# Patient Record
Sex: Male | Born: 1955
Health system: Southern US, Community
[De-identification: ages and names within clinical notes are randomized; demographics above are authoritative.]

## PROBLEM LIST (undated history)

## (undated) DIAGNOSIS — E785 Hyperlipidemia, unspecified: Secondary | ICD-10-CM

## (undated) DIAGNOSIS — H269 Unspecified cataract: Secondary | ICD-10-CM

## (undated) DIAGNOSIS — R42 Dizziness and giddiness: Secondary | ICD-10-CM

## (undated) DIAGNOSIS — K219 Gastro-esophageal reflux disease without esophagitis: Secondary | ICD-10-CM

## (undated) DIAGNOSIS — I10 Essential (primary) hypertension: Secondary | ICD-10-CM

## (undated) DIAGNOSIS — I219 Acute myocardial infarction, unspecified: Secondary | ICD-10-CM

## (undated) DIAGNOSIS — D110 Benign neoplasm of parotid gland: Secondary | ICD-10-CM

## (undated) DIAGNOSIS — I2510 Atherosclerotic heart disease of native coronary artery without angina pectoris: Secondary | ICD-10-CM

## (undated) DIAGNOSIS — T7840XA Allergy, unspecified, initial encounter: Secondary | ICD-10-CM

## (undated) DIAGNOSIS — K118 Other diseases of salivary glands: Secondary | ICD-10-CM

## (undated) HISTORY — DX: Allergy, unspecified, initial encounter: T78.40XA

## (undated) HISTORY — DX: Atherosclerotic heart disease of native coronary artery without angina pectoris: I25.10

## (undated) HISTORY — DX: Unspecified cataract: H26.9

## (undated) HISTORY — DX: Hyperlipidemia, unspecified: E78.5

## (undated) HISTORY — DX: Essential (primary) hypertension: I10

## (undated) HISTORY — DX: Gastro-esophageal reflux disease without esophagitis: K21.9

## (undated) HISTORY — DX: Acute myocardial infarction, unspecified: I21.9

---

## 2008-05-29 ENCOUNTER — Ambulatory Visit: Payer: Self-pay | Admitting: Internal Medicine

## 2008-05-29 DIAGNOSIS — I10 Essential (primary) hypertension: Secondary | ICD-10-CM

## 2008-05-29 DIAGNOSIS — K219 Gastro-esophageal reflux disease without esophagitis: Secondary | ICD-10-CM | POA: Insufficient documentation

## 2008-05-29 DIAGNOSIS — J301 Allergic rhinitis due to pollen: Secondary | ICD-10-CM | POA: Insufficient documentation

## 2008-05-29 DIAGNOSIS — R002 Palpitations: Secondary | ICD-10-CM | POA: Insufficient documentation

## 2008-05-29 LAB — CONVERTED CEMR LAB
Ketones, urine, test strip: NEGATIVE
Specific Gravity, Urine: <1.005
Urobilinogen, UA: 0.2
pH: 6.5

## 2008-05-31 LAB — CONVERTED CEMR LAB
AST: 19 units/L (ref 0–37)
Alkaline Phosphatase: 52 units/L (ref 39–117)
Basophils Absolute: 0 10*3/uL (ref 0.0–0.1)
Basophils Relative: 0.5 % (ref 0.0–3.0)
Bilirubin, Direct: 0.1 mg/dL (ref 0.0–0.3)
Eosinophils Absolute: 0.1 10*3/uL (ref 0.0–0.7)
Lymphocytes Relative: 18.7 % (ref 12.0–46.0)
MCHC: 35.1 g/dL (ref 30.0–36.0)
MCV: 86.3 fL (ref 78.0–100.0)
Neutrophils Relative %: 76.5 % (ref 43.0–77.0)
Platelets: 186 10*3/uL (ref 150–400)
RBC: 4.99 M/uL (ref 4.22–5.81)
RDW: 12.2 % (ref 11.5–14.6)
Total Bilirubin: 0.6 mg/dL (ref 0.3–1.2)

## 2008-06-30 ENCOUNTER — Ambulatory Visit: Payer: Self-pay | Admitting: Internal Medicine

## 2008-06-30 DIAGNOSIS — R079 Chest pain, unspecified: Secondary | ICD-10-CM

## 2008-07-03 LAB — CONVERTED CEMR LAB
Albumin: 4 g/dL (ref 3.5–5.2)
CO2: 31 meq/L (ref 19–32)
Calcium: 9.4 mg/dL (ref 8.4–10.5)
Creatinine, Ser: 0.9 mg/dL (ref 0.4–1.5)
GFR calc Af Amer: 114 mL/min
GFR calc non Af Amer: 95 mL/min
HDL: 29.8 mg/dL — ABNORMAL LOW (ref >39.0)
LDL Cholesterol: 153 mg/dL — ABNORMAL HIGH (ref 0–99)
Sodium: 139 meq/L (ref 135–145)
Total CHOL/HDL Ratio: 6.6
Triglycerides: 77 mg/dL (ref 0–149)
VLDL: 15 mg/dL (ref 0–40)

## 2009-01-01 ENCOUNTER — Ambulatory Visit: Payer: Self-pay | Admitting: Internal Medicine

## 2009-01-01 DIAGNOSIS — E785 Hyperlipidemia, unspecified: Secondary | ICD-10-CM | POA: Insufficient documentation

## 2009-01-01 DIAGNOSIS — L570 Actinic keratosis: Secondary | ICD-10-CM | POA: Insufficient documentation

## 2009-01-02 LAB — CONVERTED CEMR LAB
AST: 19 units/L (ref 0–37)
Alkaline Phosphatase: 43 units/L (ref 39–117)
BUN: 10 mg/dL (ref 6–23)
Basophils Absolute: 0.1 10*3/uL (ref 0.0–0.1)
Basophils Relative: 1 % (ref 0–1)
Eosinophils Relative: 2 % (ref 0–5)
Glucose, Bld: 96 mg/dL (ref 70–99)
Hemoglobin: 15 g/dL (ref 13.0–17.0)
Lymphocytes Relative: 21 % (ref 12–46)
MCHC: 34.6 g/dL (ref 30.0–36.0)
Monocytes Absolute: 0.8 10*3/uL (ref 0.1–1.0)
Neutro Abs: 4.9 10*3/uL (ref 1.7–7.7)
PSA: 0.86 ng/mL (ref 0.10–4.00)
Platelets: 225 10*3/uL (ref 150–400)
RDW: 13.2 % (ref 11.5–15.5)
Total Bilirubin: 0.4 mg/dL (ref 0.3–1.2)

## 2009-01-15 ENCOUNTER — Ambulatory Visit: Payer: Self-pay | Admitting: Internal Medicine

## 2009-01-16 ENCOUNTER — Encounter: Payer: Self-pay | Admitting: Internal Medicine

## 2009-01-16 LAB — CONVERTED CEMR LAB: Fecal Occult Bld: NEGATIVE

## 2009-07-17 ENCOUNTER — Ambulatory Visit: Payer: Self-pay | Admitting: Internal Medicine

## 2009-07-17 DIAGNOSIS — L301 Dyshidrosis [pompholyx]: Secondary | ICD-10-CM | POA: Insufficient documentation

## 2010-01-10 ENCOUNTER — Ambulatory Visit: Payer: Self-pay | Admitting: Internal Medicine

## 2010-01-11 LAB — CONVERTED CEMR LAB
Albumin: 4.5 g/dL (ref 3.5–5.2)
Alkaline Phosphatase: 46 units/L (ref 39–117)
Basophils Absolute: 0.1 10*3/uL (ref 0.0–0.1)
CO2: 27 meq/L (ref 19–32)
Calcium: 9.7 mg/dL (ref 8.4–10.5)
Chloride: 100 meq/L (ref 96–112)
Glucose, Bld: 76 mg/dL (ref 70–99)
HCT: 41.3 % (ref 39.0–52.0)
Hemoglobin: 14.5 g/dL (ref 13.0–17.0)
Lymphocytes Relative: 24 % (ref 12–46)
Lymphs Abs: 1.6 10*3/uL (ref 0.7–4.0)
Monocytes Absolute: 0.6 10*3/uL (ref 0.1–1.0)
Monocytes Relative: 8 % (ref 3–12)
Neutro Abs: 4.4 10*3/uL (ref 1.7–7.7)
PSA: 0.9 ng/mL (ref 0.10–4.00)
Potassium: 3.7 meq/L (ref 3.5–5.3)
RBC: 4.98 M/uL (ref 4.22–5.81)
Sodium: 138 meq/L (ref 135–145)
Total Protein: 7.1 g/dL (ref 6.0–8.3)
WBC: 6.7 10*3/uL (ref 4.0–10.5)

## 2010-01-21 ENCOUNTER — Ambulatory Visit: Payer: Self-pay | Admitting: Internal Medicine

## 2010-01-21 LAB — CONVERTED CEMR LAB: Fecal Occult Bld: NEGATIVE

## 2010-07-15 ENCOUNTER — Ambulatory Visit: Payer: Self-pay | Admitting: Internal Medicine

## 2010-08-27 NOTE — Letter (Signed)
Summary: [facility]: Immunoassay Fecal Occult Blood (iFOB) Order Form  [name] [facility]  [address] [place], [place] [address]   Phone: [phone]  Fax: [phone]       Lab: Immunoassay Fecal Occult Blood (iFOB) Order Form   January 10, 2010 MRN: [id number]   [facility] [employer] May 12, 1956   Physicican Name:_______[place]__________________  Diagnosis Code:________V76.51__________________      Cindee Salt MD

## 2010-08-27 NOTE — Letter (Signed)
Summary: Results Follow up Letter  [place] [facility]  [address] [place], [place] [address]   Phone: [phone]  Fax: [phone]    01/21/2010 MRN: [id number]  Jason Gross [address] [place], [place]  [address]  Dear Mr. HANTZ,  The following are the results of your recent test(s):  Test         Result    Pap Smear:        Normal _____  Not Normal _____ Comments: ______________________________________________________ Cholesterol: LDL(Bad cholesterol):         Your goal is less than:         HDL (Good cholesterol):       Your goal is more than: Comments:  ______________________________________________________ Mammogram:        Normal _____  Not Normal _____ Comments:  ___________________________________________________________________ Hemoccult:        Normal __X___  Not normal _______ Comments:  stool test negative for blood, we will recheck this next year.  _____________________________________________________________________ Other Tests:    We routinely do not discuss normal results over the telephone.  If you desire a copy of the results, or you have any questions about this information we can discuss them at your next office visit.   Sincerely,      Nada Libman

## 2010-08-27 NOTE — Assessment & Plan Note (Signed)
Summary: CPX / LFW   Vital Signs:  Patient profile:   55 year old male Height:      70.75 inches Weight:      207 pounds BMI:     29.18 Temp:     98.9 degrees F oral Pulse rate:   64 / minute Pulse rhythm:   regular BP sitting:   126 / 78  (left arm) Cuff size:   regular  Vitals Entered By: Lewanda Rife LPN (January 10, 2010 2:07 PM) CC: CPX   History of Present Illness: doing okay  still has trouble with hands triamcinolone didn't help back to his lotion  has skin tag under right eye bothers wife but not him     Allergies (verified): No Known Drug Allergies  Review of Systems General:  No sig exercise--does walk occ, now with grandkids and son in house so keeps him busy sleeps okay wears seat belt. Eyes:  Denies double vision and vision loss-1 eye. ENT:  Complains of decreased hearing; denies ringing in ears; wife notices some decreased hearing wears hearing protection at work teeth okay---regular with dentist. CV:  Denies chest pain or discomfort, difficulty breathing at night, difficulty breathing while lying down, fainting, lightheadness, palpitations, and shortness of breath with exertion. Resp:  Denies cough and shortness of breath. GI:  Complains of indigestion; denies abdominal pain, bloody stools, change in bowel habits, dark tarry stools, nausea, and vomiting; occ indigestion--drinks carbonated drink, burps and better. GU:  Denies erectile dysfunction, urinary frequency, and urinary hesitancy. MS:  Complains of cramps; denies joint pain and joint swelling. Derm:  See HPI; Complains of lesion(s); denies rash. Neuro:  Denies headaches, numbness, tingling, and weakness. Psych:  Denies anxiety and depression. Heme:  Denies abnormal bruising and enlarge lymph nodes. Allergy:  Complains of seasonal allergies and sneezing; uses OTC meds as needed .  Physical Exam  General:  alert and normal appearance.   Eyes:  pupils equal, pupils round, pupils reactive to  light, and no optic disk abnormalities.   Ears:  R ear normal and L ear normal.   Mouth:  no erythema, no exudates, and no lesions.   Neck:  supple, no masses, no thyromegaly, no carotid bruits, and no cervical lymphadenopathy.   Lungs:  normal respiratory effort and normal breath sounds.   Heart:  normal rate, regular rhythm, no murmur, and no gallop.   Abdomen:  soft, non-tender, and no masses.   Rectal:  no hemorrhoids and no masses.   Prostate:  no gland enlargement and no nodules.   Msk:  no joint tenderness and no joint swelling.   Pulses:  normal in feet Extremities:  no edema Neurologic:  alert & oriented X3, strength normal in all extremities, and gait normal.   Skin:  no rashes and no suspicious lesions.   Axillary Nodes:  No palpable lymphadenopathy Psych:  normally interactive, good eye contact, not anxious appearing, and not depressed appearing.     Impression & Recommendations:  Problem # 1:  PREVENTIVE HEALTH CARE (ICD-V70.0) Assessment Comment Only healthy counselled on fitness stool immunoassay PSA  Problem # 2:  HYPERTENSION (ICD-401.9) Assessment: Unchanged  good control no changes needed  His updated medication list for this problem includes:    Lisinopril-hydrochlorothiazide 10-12.5 Mg Tabs (Lisinopril-hydrochlorothiazide) .Marland Kitchen... 1 daily  BP today: 126/78 Prior BP: 130/80 (07/17/2009)  Labs Reviewed: K+: 4.1 (01/01/2009) Creat: : 0.83 (01/01/2009)   Chol: 198 (06/30/2008)   HDL: 29.8 (06/30/2008)   LDL: 153 (06/30/2008)  TG: 77 (06/30/2008)  Orders: TLB-Renal Function Panel (80069-RENAL) TLB-CBC Platelet - w/Differential (85025-CBCD) TLB-Hepatic/Liver Function Pnl (80076-HEPATIC) TLB-TSH (Thyroid Stimulating Hormone) (84443-TSH) Venipuncture (40347)  Problem # 3:  HYPERLIPIDEMIA (ICD-272.4) Assessment: Unchanged okay without Rx ---would only be primary prevention  Labs Reviewed: SGOT: 19 (01/01/2009)   SGPT: 29 (01/01/2009)   HDL:29.8  (06/30/2008)  LDL:153 (06/30/2008)  Chol:198 (06/30/2008)  Trig:77 (06/30/2008)  Complete Medication List: 1)  Lisinopril-hydrochlorothiazide 10-12.5 Mg Tabs (Lisinopril-hydrochlorothiazide) .Marland Kitchen.. 1 daily 2)  Cetirizine Hcl 10 Mg Tabs (Cetirizine hcl) .... As needed 3)  Triamcinolone Acetonide 0.1 % Lotn (Triamcinolone acetonide) .... Apply to hands three times a day as needed  Other Orders: TLB-PSA (Prostate Specific Antigen) (84153-PSA)  Patient Instructions: 1)  Please schedule a follow-up appointment in 6 months .  2)  Complete your hemoccult cards and return them soon.   Current Allergies (reviewed today): No known allergies   Prevention & Chronic Care Immunizations   Influenza vaccine: Fluvax 3+  (07/17/2009)    Tetanus booster: 07/28/2005: Historical    Pneumococcal vaccine: Not documented  Colorectal Screening   Hemoccult: Not documented    Colonoscopy: Not documented  Other Screening   PSA: 0.86  (01/01/2009)   PSA ordered.   Smoking status: quit  (05/29/2008)  Lipids   Total Cholesterol: 198  (06/30/2008)   LDL: 153  (06/30/2008)   LDL Direct: Not documented   HDL: 29.8  (06/30/2008)   Triglycerides: 77  (06/30/2008)    SGOT (AST): 19  (01/01/2009)   SGPT (ALT): 29  (01/01/2009)   Alkaline phosphatase: 43  (01/01/2009)   Total bilirubin: 0.4  (01/01/2009)  Hypertension   Last Blood Pressure: 126 / 78  (01/10/2010)   Serum creatinine: 0.83  (01/01/2009)   Serum potassium 4.1  (01/01/2009)  Self-Management Support :    Hypertension self-management support: Not documented    Lipid self-management support: Not documented     Appended Document: CPX / LFW

## 2010-08-29 NOTE — Assessment & Plan Note (Signed)
Summary: FOLLOW UP / LFW   Vital Signs:  Patient profile:   55 year old male Weight:      206 pounds Temp:     99.1 degrees F oral Pulse rate:   60 / minute Pulse rhythm:   regular BP sitting:   136 / 80  (left arm) Cuff size:   large  Vitals Entered By: Mervin Hack CMA Duncan Dull) (July 15, 2010 4:25 PM) CC: 6 month follow-up   History of Present Illness: Doing okay  DId have a bad allergy season this fall Milder but still a lot of drainage he is not excited about trying nasal spray again--did use OTC in past but not recently never been on inhaled steroids  Occ checks BP usually pretty good---"in the chart"--doesn't remember the numbers Occ headache--nothing regular No chest pain  No SOB  No recent trouble with GERD intermittent and depends on food hasn't needed meds  Allergies: No Known Drug Allergies  Past History:  Past medical, surgical, family and social histories (including risk factors) reviewed for relevance to current acute and chronic problems.  Past Medical History: Reviewed history from 01/01/2009 and no changes required. Hypertension Allergic rhinitis  GERD Hyperlipidemia  Past Surgical History: Reviewed history from 05/29/2008 and no changes required. Denies surgical history  Family History: Reviewed history from 05/29/2008 and no changes required. Dad has CAD (stents), pseudogout Mom healthy 1 brother had brain aneurysm 1 sister---borderline DM Mat GF also had CAD No HTN DM in mat GF and mat uncles No colon or prostate cancer  Social History: Reviewed history from 05/29/2008 and no changes required. Occupation: Chartered certified accountant @[employer]  Married Former Smoker--only a little. Quit in 1970's Alcohol use-very occ  Review of Systems       appetite is fine weight is stable sleeps fine (though not enough at times)  Physical Exam  General:  alert and normal appearance.   Head:  no sinus tenderness Nose:  marked pale congestion  with thick mucus Mouth:  no erythema and no exudates.   Neck:  supple, no masses, and no cervical lymphadenopathy.   Lungs:  normal respiratory effort, no intercostal retractions, no accessory muscle use, normal breath sounds, no crackles, and no wheezes.   Heart:  normal rate, regular rhythm, no murmur, and no gallop.   Extremities:  no edema Psych:  normally interactive, good eye contact, not anxious appearing, and not depressed appearing.     Impression & Recommendations:  Problem # 1:  HYPERTENSION (ICD-401.9) Assessment Unchanged good control no changes needed  His updated medication list for this problem includes:    Lisinopril-hydrochlorothiazide 10-12.5 Mg Tabs (Lisinopril-hydrochlorothiazide) .Marland Kitchen... 1 daily  BP today: 136/80 Prior BP: 126/78 (01/10/2010)  Labs Reviewed: K+: 3.7 (01/10/2010) Creat: : 0.88 (01/10/2010)   Chol: 198 (06/30/2008)   HDL: 29.8 (06/30/2008)   LDL: 153 (06/30/2008)   TG: 77 (06/30/2008)  Problem # 2:  ALLERGIC RHINITIS (ICD-477.9) Assessment: Deteriorated will have him try loratadine also if not effective, add fluticasone spray His updated medication list for this problem includes:    Cetirizine Hcl 10 Mg Tabs (Cetirizine hcl) .Marland Kitchen... As needed  Problem # 3:  GERD (ICD-530.81) Assessment: Unchanged quiet without meds  Complete Medication List: 1)  Lisinopril-hydrochlorothiazide 10-12.5 Mg Tabs (Lisinopril-hydrochlorothiazide) .Marland Kitchen.. 1 daily 2)  Cetirizine Hcl 10 Mg Tabs (Cetirizine hcl) .... As needed  Patient Instructions: 1)  Please try loratadine 10mg  1-2 daily if the cetirizine isn't enough to control allergies. If not effective, call for prescription for  medicated nasal spray 2)  Please schedule a follow-up appointment in 6 months for physical   Orders Added: 1)  Est. Patient Level IV [91478]    Current Allergies (reviewed today): No known allergies   Prevention & Chronic Care Immunizations   Influenza vaccine: Fluvax 3+   (07/17/2009)    Tetanus booster: 07/28/2005: Historical    Pneumococcal vaccine: Not documented  Colorectal Screening   Hemoccult: Not documented    Colonoscopy: Not documented  Other Screening   PSA: 0.90  (01/10/2010)   Smoking status: quit  (05/29/2008)  Lipids   Total Cholesterol: 198  (06/30/2008)   LDL: 153  (06/30/2008)   LDL Direct: Not documented   HDL: 29.8  (06/30/2008)   Triglycerides: 77  (06/30/2008)    SGOT (AST): 16  (01/10/2010)   SGPT (ALT): 23  (01/10/2010)   Alkaline phosphatase: 46  (01/10/2010)   Total bilirubin: 0.7  (01/10/2010)  Hypertension   Last Blood Pressure: 136 / 80  (07/15/2010)   Serum creatinine: 0.88  (01/10/2010)   Serum potassium 3.7  (01/10/2010)  Self-Management Support :    Hypertension self-management support: Not documented    Lipid self-management support: Not documented

## 2011-01-14 ENCOUNTER — Encounter: Payer: Self-pay | Admitting: Internal Medicine

## 2011-01-15 ENCOUNTER — Encounter: Payer: Self-pay | Admitting: Internal Medicine

## 2011-01-15 ENCOUNTER — Ambulatory Visit (INDEPENDENT_AMBULATORY_CARE_PROVIDER_SITE_OTHER): Payer: BC Managed Care – PPO | Admitting: Internal Medicine

## 2011-01-15 VITALS — BP 130/80 | HR 69 | Temp 98.5°F | Ht 70.0 in | Wt 206.0 lb

## 2011-01-15 DIAGNOSIS — I10 Essential (primary) hypertension: Secondary | ICD-10-CM

## 2011-01-15 DIAGNOSIS — E785 Hyperlipidemia, unspecified: Secondary | ICD-10-CM

## 2011-01-15 DIAGNOSIS — J309 Allergic rhinitis, unspecified: Secondary | ICD-10-CM

## 2011-01-15 DIAGNOSIS — Z0000 Encounter for general adult medical examination without abnormal findings: Secondary | ICD-10-CM

## 2011-01-15 DIAGNOSIS — L570 Actinic keratosis: Secondary | ICD-10-CM

## 2011-01-15 LAB — PSA: PSA: 0.46 ng/mL (ref 0.10–4.00)

## 2011-01-15 LAB — BASIC METABOLIC PANEL
Calcium: 9.1 mg/dL (ref 8.4–10.5)
Creatinine, Ser: 0.8 mg/dL (ref 0.4–1.5)
GFR: 101.26 mL/min (ref >60.00)

## 2011-01-15 LAB — CBC WITH DIFFERENTIAL/PLATELET
Basophils Relative: 0.8 % (ref 0.0–3.0)
Eosinophils Absolute: 0.1 10*3/uL (ref 0.0–0.7)
Eosinophils Relative: 1.4 % (ref 0.0–5.0)
Lymphocytes Relative: 24.1 % (ref 12.0–46.0)
MCHC: 35.4 g/dL (ref 30.0–36.0)
Monocytes Relative: 9.5 % (ref 3.0–12.0)
Neutrophils Relative %: 64.2 % (ref 43.0–77.0)
RBC: 4.94 Mil/uL (ref 4.22–5.81)
WBC: 6.2 10*3/uL (ref 4.5–10.5)

## 2011-01-15 LAB — LIPID PANEL
HDL: 37.7 mg/dL — ABNORMAL LOW (ref >39.00)
Total CHOL/HDL Ratio: 5
VLDL: 13.4 mg/dL (ref 0.0–40.0)

## 2011-01-15 LAB — HEPATIC FUNCTION PANEL
ALT: 25 U/L (ref 0–53)
Bilirubin, Direct: 0.1 mg/dL (ref 0.0–0.3)
Total Bilirubin: 0.6 mg/dL (ref 0.3–1.2)

## 2011-01-15 NOTE — Assessment & Plan Note (Signed)
Healthy Discussed fitness Will do stool immunoassay PSA after discussion

## 2011-01-15 NOTE — Assessment & Plan Note (Signed)
Lab Results  Component Value Date   LDLCALC 153* 06/30/2008   Discussed medication Certainly should be treated if LDL over 160

## 2011-01-15 NOTE — Assessment & Plan Note (Signed)
Hands remain dry Discussed gloves and emolliants

## 2011-01-15 NOTE — Assessment & Plan Note (Signed)
BP Readings from Last 3 Encounters:  01/15/11 130/80  07/15/10 136/80  01/10/10 126/78   Good control Due for labs  Lab Results  Component Value Date   CREATININE 0.88 01/10/2010

## 2011-01-15 NOTE — Assessment & Plan Note (Signed)
Fair control with 2 antihistamines

## 2011-01-15 NOTE — Progress Notes (Signed)
Subjective:    Patient ID: Jason Jason, male    DOB: [birth year], 55 y.o.   MRN: [id number]  HPI Doing well No new concerns  Prefers to continue with stool immunoassay Discussed PSA --he has no firm opinion  Current Outpatient Prescriptions on File Prior to Visit  Medication Sig Dispense Refill  . cetirizine (ZYRTEC) 10 MG tablet Take 10 mg by mouth daily.        [facility] lisinopril-hydrochlorothiazide (PRINZIDE,ZESTORETIC) 10-12.5 MG per tablet Take 1 tablet by mouth daily.         Past Medical History  Diagnosis Date  . Hypertension   . Allergy   . Hyperlipidemia   . GERD (gastroesophageal reflux disease)     No past surgical history on file.  Family History  Problem Relation Age of Onset  . Healthy Mother   . Coronary artery disease Father   . Diabetes Sister   . Diabetes Maternal Uncle   . Coronary artery disease Maternal Grandfather   . Diabetes Maternal Grandfather   . Hypertension Neg Hx   . Cancer Neg Hx     History   Social History  . Marital Status: Married    Spouse Name: N/A    Number of Children: N/A  . Years of Education: N/A   Occupational History  . Machinist @ [employer]    Social History Main Topics  . Smoking status: Former Smoker    Quit date: 07/28/1968  . Smokeless tobacco: Not on file  . Alcohol Use: Yes     occasional  . Drug Use: No  . Sexually Active: Not on file   Other Topics Concern  . Not on file   Social History Narrative  . No narrative on file   Review of Systems  Constitutional: Negative for fatigue and unexpected weight change.       Wears seat belt  HENT: Positive for hearing loss, congestion and rhinorrhea. Negative for tinnitus.        Bad allergy season Uses zyrtec and loratadine  Regular with dentist  Eyes: Negative for visual disturbance.       No diplopia or focal vision problems  Respiratory: Negative for cough, chest tightness and shortness of breath.   Cardiovascular: Negative for chest pain, palpitations and  leg swelling.  Genitourinary: Negative for dysuria, frequency and difficulty urinating.       No sexual problems  Musculoskeletal: Positive for back pain. Negative for joint swelling and arthralgias.       Joints crack in AM--no sig stiffness occ mild back pain  Skin: Positive for rash.       Still gets hand rash when exposed to oil at work  Neurological: Negative for dizziness, syncope, weakness, light-headedness and headaches.  Hematological: Negative for adenopathy. Does not bruise/bleed easily.  Psychiatric/Behavioral: Negative for sleep disturbance and dysphoric mood. The patient is not nervous/anxious.        Objective:   Physical Exam  Constitutional: He is oriented to person, place, and time. He appears well-developed and well-nourished. No distress.  HENT:  Head: Normocephalic and atraumatic.  Right Ear: External ear normal.  Left Ear: External ear normal.  Mouth/Throat: Oropharynx is clear and moist. No oropharyngeal exudate.       TMs normal  Eyes: Conjunctivae and EOM are normal. Pupils are equal, round, and reactive to light.       Fundi benign  Neck: Normal range of motion. Neck supple. No thyromegaly present.  Cardiovascular: Normal rate, regular rhythm, normal heart  sounds and intact distal pulses.  Exam reveals no gallop.   No murmur heard. Pulmonary/Chest: Effort normal and breath sounds normal. No respiratory distress. He has no wheezes. He has no rales.  Abdominal: Soft. He exhibits no mass. There is no tenderness.  Musculoskeletal: Normal range of motion. He exhibits no edema and no tenderness.  Lymphadenopathy:    He has no cervical adenopathy.  Neurological: He is alert and oriented to person, place, and time. He exhibits normal muscle tone.       Gait and strength normal  Skin: Skin is warm. No rash noted.  Psychiatric: He has a normal mood and affect. His behavior is normal. Judgment and thought content normal.          Assessment & Plan:

## 2011-02-03 ENCOUNTER — Other Ambulatory Visit: Payer: BC Managed Care – PPO

## 2011-02-03 ENCOUNTER — Other Ambulatory Visit: Payer: Self-pay | Admitting: Internal Medicine

## 2011-02-03 DIAGNOSIS — Z1289 Encounter for screening for malignant neoplasm of other sites: Secondary | ICD-10-CM

## 2011-02-04 ENCOUNTER — Encounter: Payer: Self-pay | Admitting: *Deleted

## 2011-04-16 ENCOUNTER — Other Ambulatory Visit: Payer: Self-pay | Admitting: Internal Medicine

## 2011-07-17 ENCOUNTER — Ambulatory Visit: Payer: BC Managed Care – PPO | Admitting: Internal Medicine

## 2011-07-17 DIAGNOSIS — Z0289 Encounter for other administrative examinations: Secondary | ICD-10-CM

## 2011-11-17 ENCOUNTER — Emergency Department: Payer: Self-pay | Admitting: Emergency Medicine

## 2012-02-24 ENCOUNTER — Other Ambulatory Visit: Payer: Self-pay | Admitting: Internal Medicine

## 2012-02-24 NOTE — Telephone Encounter (Signed)
Pt not seen since 12/2010

## 2012-02-24 NOTE — Telephone Encounter (Signed)
Okay to fill #30 x 2 Needs appt soon

## 2012-06-20 ENCOUNTER — Other Ambulatory Visit: Payer: Self-pay | Admitting: Internal Medicine

## 2012-08-27 ENCOUNTER — Encounter: Payer: Self-pay | Admitting: Internal Medicine

## 2012-08-27 ENCOUNTER — Ambulatory Visit (INDEPENDENT_AMBULATORY_CARE_PROVIDER_SITE_OTHER): Payer: PRIVATE HEALTH INSURANCE | Admitting: Internal Medicine

## 2012-08-27 VITALS — BP 122/70 | HR 83 | Temp 97.7°F | Ht 71.0 in | Wt 204.0 lb

## 2012-08-27 DIAGNOSIS — E785 Hyperlipidemia, unspecified: Secondary | ICD-10-CM

## 2012-08-27 DIAGNOSIS — Z0000 Encounter for general adult medical examination without abnormal findings: Secondary | ICD-10-CM

## 2012-08-27 DIAGNOSIS — I10 Essential (primary) hypertension: Secondary | ICD-10-CM

## 2012-08-27 DIAGNOSIS — Z125 Encounter for screening for malignant neoplasm of prostate: Secondary | ICD-10-CM

## 2012-08-27 DIAGNOSIS — Z1211 Encounter for screening for malignant neoplasm of colon: Secondary | ICD-10-CM

## 2012-08-27 LAB — CBC WITH DIFFERENTIAL/PLATELET
Basophils Relative: 0.7 % (ref 0.0–3.0)
Eosinophils Absolute: 0.1 10*3/uL (ref 0.0–0.7)
MCHC: 34 g/dL (ref 30.0–36.0)
MCV: 84.6 fl (ref 78.0–100.0)
Monocytes Absolute: 0.6 10*3/uL (ref 0.1–1.0)
Neutrophils Relative %: 71.5 % (ref 43.0–77.0)
Platelets: 260 10*3/uL (ref 150.0–400.0)
RBC: 5.23 Mil/uL (ref 4.22–5.81)
RDW: 13.5 % (ref 11.5–14.6)

## 2012-08-27 LAB — BASIC METABOLIC PANEL
BUN: 15 mg/dL (ref 6–23)
CO2: 30 mEq/L (ref 19–32)
Chloride: 99 mEq/L (ref 96–112)
Creatinine, Ser: 0.9 mg/dL (ref 0.4–1.5)
Glucose, Bld: 89 mg/dL (ref 70–99)

## 2012-08-27 LAB — HEPATIC FUNCTION PANEL
ALT: 26 U/L (ref 0–53)
Total Bilirubin: 0.8 mg/dL (ref 0.3–1.2)
Total Protein: 7.3 g/dL (ref 6.0–8.3)

## 2012-08-27 LAB — LIPID PANEL
Cholesterol: 199 mg/dL (ref 0–200)
HDL: 39.1 mg/dL (ref >39.00)
Triglycerides: 67 mg/dL (ref 0.0–149.0)

## 2012-08-27 NOTE — Progress Notes (Signed)
Subjective:    Patient ID: Jason Gross, male    DOB: 1955/09/08, 57 y.o.   MRN: [id number]  HPI Here for physical Having some sinus drainage--related to the weather Not sick  No new concerns Same job--had injury with die breaking---hit teeth and lip. (needed implants) No exercise now Stays busy in summertime Discussed that he is overweight  Current Outpatient Prescriptions on File Prior to Visit  Medication Sig Dispense Refill  . cetirizine (ZYRTEC) 10 MG tablet Take 10 mg by mouth daily.        Marland Kitchen lisinopril-hydrochlorothiazide (PRINZIDE,ZESTORETIC) 10-12.5 MG per tablet TAKE ONE TABLET BY MOUTH EVERY DAY  30 tablet  1    No Known Allergies  Past Medical History  Diagnosis Date  . Hypertension   . Allergy   . Hyperlipidemia   . GERD (gastroesophageal reflux disease)     No past surgical history on file.  Family History  Problem Relation Age of Onset  . Healthy Mother   . Coronary artery disease Father   . Diabetes Sister   . Diabetes Maternal Uncle   . Coronary artery disease Maternal Grandfather   . Diabetes Maternal Grandfather   . Hypertension Neg Hx   . Cancer Neg Hx     History   Social History  . Marital Status: Married    Spouse Name: N/A    Number of Children: N/A  . 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 Smoking status: Former Smoker    Quit date: 07/28/1968  . Smokeless tobacco: Never Used  . Alcohol Use: Yes     Comment: occasional  . Drug Use: No  . Sexually Active: Not on file   Other Topics Concern  . Not on file   Social History Narrative  . No narrative on file   Review of Systems  Constitutional: Negative for fatigue and unexpected weight change.       Wears seat belt  HENT: Positive for hearing loss, congestion, rhinorrhea, dental problem and postnasal drip. Negative for tinnitus.        Mild hearing loss---uses ear plugs most of the time  Eyes: Negative for  visual disturbance.       No diplopia or unilateral vision loss  Respiratory: Positive for cough. Negative for chest tightness and shortness of breath.   Cardiovascular: Negative for chest pain, palpitations and leg swelling.  Gastrointestinal: Negative for nausea, vomiting, abdominal pain, constipation and blood in stool.       No heartburn  Genitourinary: Negative for urgency, frequency and difficulty urinating.       No sexual problems No dribbling  Musculoskeletal: Positive for back pain. Negative for joint swelling and arthralgias.       Occ mild back pain  Skin: Negative for rash.       Dry hands persist---crack open still  Neurological: Positive for headaches. Negative for dizziness, syncope, weakness, light-headedness and numbness.       Rare headaches--ibuprofen helps  Hematological: Negative for adenopathy. Does not bruise/bleed easily.  Psychiatric/Behavioral: Negative for sleep disturbance and dysphoric mood. The patient is not nervous/anxious.        Objective:   Physical Exam  Constitutional: He is oriented to person, place, and time. He appears well-developed and well-nourished. No distress.  HENT:  Head: Normocephalic and atraumatic.  Right Ear: External ear normal.  Left Ear: External ear normal.  Mouth/Throat: Oropharynx is clear and moist.  No oropharyngeal exudate.  Eyes: Conjunctivae normal and EOM are normal. Pupils are equal, round, and reactive to light.  Neck: Normal range of motion. Neck supple. No thyromegaly present.  Cardiovascular: Normal rate, regular rhythm, normal heart sounds and intact distal pulses.  Exam reveals no gallop.   No murmur heard. Pulmonary/Chest: Effort normal and breath sounds normal. No respiratory distress. He has no wheezes. He has no rales.  Abdominal: Soft. There is no tenderness.  Musculoskeletal: He exhibits no edema and no tenderness.  Lymphadenopathy:    He has no cervical adenopathy.  Neurological: He is alert and  oriented to person, place, and time.  Skin: No rash noted. No erythema.  Psychiatric: He has a normal mood and affect. His behavior is normal.          Assessment & Plan:

## 2012-08-27 NOTE — Assessment & Plan Note (Signed)
Healthy but not in shape Discussed proper eating Will check PSA Stool immunoassay Flu vaccine

## 2012-08-27 NOTE — Assessment & Plan Note (Signed)
BP Readings from Last 3 Encounters:  08/27/12 122/70  01/15/11 130/80  07/15/10 136/80   Good control No changes needed

## 2012-08-27 NOTE — Assessment & Plan Note (Signed)
Discussed primary prevention again If his LDL is over 160, will start statin

## 2012-08-29 ENCOUNTER — Other Ambulatory Visit: Payer: Self-pay | Admitting: Internal Medicine

## 2012-08-30 ENCOUNTER — Encounter: Payer: Self-pay | Admitting: *Deleted

## 2012-09-06 ENCOUNTER — Other Ambulatory Visit (INDEPENDENT_AMBULATORY_CARE_PROVIDER_SITE_OTHER): Payer: PRIVATE HEALTH INSURANCE

## 2012-09-06 DIAGNOSIS — Z1211 Encounter for screening for malignant neoplasm of colon: Secondary | ICD-10-CM

## 2012-09-07 ENCOUNTER — Encounter: Payer: Self-pay | Admitting: *Deleted

## 2012-09-07 LAB — FECAL OCCULT BLOOD, IMMUNOCHEMICAL: Fecal Occult Bld: NEGATIVE

## 2013-08-30 ENCOUNTER — Encounter: Payer: PRIVATE HEALTH INSURANCE | Admitting: Internal Medicine

## 2013-09-06 ENCOUNTER — Encounter: Payer: Self-pay | Admitting: Internal Medicine

## 2013-09-06 ENCOUNTER — Ambulatory Visit (INDEPENDENT_AMBULATORY_CARE_PROVIDER_SITE_OTHER): Payer: PRIVATE HEALTH INSURANCE | Admitting: Internal Medicine

## 2013-09-06 VITALS — BP 110/80 | HR 76 | Temp 98.2°F | Ht 71.0 in | Wt 209.0 lb

## 2013-09-06 DIAGNOSIS — Z1211 Encounter for screening for malignant neoplasm of colon: Secondary | ICD-10-CM

## 2013-09-06 DIAGNOSIS — I10 Essential (primary) hypertension: Secondary | ICD-10-CM

## 2013-09-06 DIAGNOSIS — E785 Hyperlipidemia, unspecified: Secondary | ICD-10-CM

## 2013-09-06 DIAGNOSIS — Z0000 Encounter for general adult medical examination without abnormal findings: Secondary | ICD-10-CM

## 2013-09-06 LAB — CBC WITH DIFFERENTIAL/PLATELET
BASOS PCT: 0.9 % (ref 0.0–3.0)
Basophils Absolute: 0.1 10*3/uL (ref 0.0–0.1)
EOS PCT: 1.2 % (ref 0.0–5.0)
Eosinophils Absolute: 0.1 10*3/uL (ref 0.0–0.7)
HCT: 46 % (ref 39.0–52.0)
Hemoglobin: 15.3 g/dL (ref 13.0–17.0)
LYMPHS PCT: 24.1 % (ref 12.0–46.0)
Lymphs Abs: 1.7 10*3/uL (ref 0.7–4.0)
MCHC: 33.2 g/dL (ref 30.0–36.0)
MCV: 86.3 fl (ref 78.0–100.0)
MONOS PCT: 9.7 % (ref 3.0–12.0)
Monocytes Absolute: 0.7 10*3/uL (ref 0.1–1.0)
NEUTROS PCT: 64.1 % (ref 43.0–77.0)
Neutro Abs: 4.4 10*3/uL (ref 1.4–7.7)
Platelets: 256 10*3/uL (ref 150.0–400.0)
RBC: 5.33 Mil/uL (ref 4.22–5.81)
RDW: 13.6 % (ref 11.5–14.6)
WBC: 6.9 10*3/uL (ref 4.5–10.5)

## 2013-09-06 LAB — LIPID PANEL
Cholesterol: 186 mg/dL (ref 0–200)
HDL: 39.2 mg/dL (ref >39.00)
LDL CALC: 129 mg/dL — AB (ref 0–99)
TRIGLYCERIDES: 88 mg/dL (ref 0.0–149.0)
Total CHOL/HDL Ratio: 5
VLDL: 17.6 mg/dL (ref 0.0–40.0)

## 2013-09-06 LAB — COMPREHENSIVE METABOLIC PANEL
ALBUMIN: 4.2 g/dL (ref 3.5–5.2)
ALT: 26 U/L (ref 0–53)
AST: 21 U/L (ref 0–37)
Alkaline Phosphatase: 56 U/L (ref 39–117)
BUN: 15 mg/dL (ref 6–23)
CALCIUM: 9.1 mg/dL (ref 8.4–10.5)
CO2: 30 meq/L (ref 19–32)
Chloride: 100 mEq/L (ref 96–112)
Creatinine, Ser: 0.9 mg/dL (ref 0.4–1.5)
GFR: 92.6 mL/min (ref >60.00)
GLUCOSE: 89 mg/dL (ref 70–99)
POTASSIUM: 4.1 meq/L (ref 3.5–5.1)
Sodium: 136 mEq/L (ref 135–145)
Total Bilirubin: 0.9 mg/dL (ref 0.3–1.2)
Total Protein: 7.6 g/dL (ref 6.0–8.3)

## 2013-09-06 LAB — T4, FREE: Free T4: 0.91 ng/dL (ref 0.60–1.60)

## 2013-09-06 LAB — TSH: TSH: 1.6 u[IU]/mL (ref 0.35–5.50)

## 2013-09-06 NOTE — Assessment & Plan Note (Signed)
Healthy but needs to work on fitness Fecal immunoassay again PSA next year

## 2013-09-06 NOTE — Patient Instructions (Signed)
Exercise to Lose Weight Exercise and a healthy diet may help you lose weight. Your doctor may suggest specific exercises. EXERCISE IDEAS AND TIPS  Choose low-cost things you enjoy doing, such as walking, bicycling, or exercising to workout videos.  Take stairs instead of the elevator.  Walk during your lunch break.  Park your car further away from work or school.  Go to a gym or an exercise class.  Start with 5 to 10 minutes of exercise each day. Build up to 30 minutes of exercise 4 to 6 days a week.  Wear shoes with good support and comfortable clothes.  Stretch before and after working out.  Work out until you breathe harder and your heart beats faster.  Drink extra water when you exercise.  Do not do so much that you hurt yourself, feel dizzy, or get very short of breath. Exercises that burn about 150 calories:  Running 1  miles in 15 minutes.  Playing volleyball for 45 to 60 minutes.  Washing and waxing a car for 45 to 60 minutes.  Playing touch football for 45 minutes.  Walking 1  miles in 35 minutes.  Pushing a stroller 1  miles in 30 minutes.  Playing basketball for 30 minutes.  Raking leaves for 30 minutes.  Bicycling 5 miles in 30 minutes.  Walking 2 miles in 30 minutes.  Dancing for 30 minutes.  Shoveling snow for 15 minutes.  Swimming laps for 20 minutes.  Walking up stairs for 15 minutes.  Bicycling 4 miles in 15 minutes.  Gardening for 30 to 45 minutes.  Jumping rope for 15 minutes.  Washing windows or floors for 45 to 60 minutes. Document Released: 08/16/2010 Document Revised: 10/06/2011 Document Reviewed: 08/16/2010 [facility] Patient Information 2014 [place], [place]. DASH Diet The DASH diet stands for "Dietary Approaches to Stop Hypertension." It is a healthy eating plan that has been shown to reduce high blood pressure (hypertension) in as little as 14 days, while also possibly providing other significant health benefits. These other  health benefits include reducing the risk of breast cancer after menopause and reducing the risk of type 2 diabetes, heart disease, colon cancer, and stroke. Health benefits also include weight loss and slowing kidney failure in patients with chronic kidney disease.  DIET GUIDELINES  Limit salt (sodium). Your diet should contain less than 1500 mg of sodium daily.  Limit refined or processed carbohydrates. Your diet should include mostly whole grains. Desserts and added sugars should be used sparingly.  Include small amounts of heart-healthy fats. These types of fats include nuts, oils, and tub margarine. Limit saturated and trans fats. These fats have been shown to be harmful in the body. CHOOSING FOODS  The following food groups are based on a 2000 calorie diet. See your Registered Dietitian for individual calorie needs. Grains and Grain Products (6 to 8 servings daily)  Eat More Often: Whole-wheat bread, brown rice, whole-grain or wheat pasta, quinoa, popcorn without added fat or salt (air popped).  Eat Less Often: White bread, white pasta, white rice, cornbread. Vegetables (4 to 5 servings daily)  Eat More Often: Fresh, frozen, and canned vegetables. Vegetables may be raw, steamed, roasted, or grilled with a minimal amount of fat.  Eat Less Often/Avoid: Creamed or fried vegetables. Vegetables in a cheese sauce. Fruit (4 to 5 servings daily)  Eat More Often: All fresh, canned (in natural juice), or frozen fruits. Dried fruits without added sugar. One hundred percent fruit juice ( cup [237 mL] daily).  Eat Less Often: Dried fruits with added sugar. Canned fruit in light or heavy syrup. [employer], Fish, and Poultry (2 servings or less daily. One serving is 3 to 4 oz [85-114 g]).  Eat More Often: Ninety percent or leaner ground beef, tenderloin, sirloin. Round cuts of beef, chicken breast, [place] breast. All fish. Grill, bake, or broil your meat. Nothing should be fried.  Eat Less  Often/Avoid: Fatty cuts of meat, [place], or chicken leg, thigh, or wing. Fried cuts of meat or fish. Dairy (2 to 3 servings)  Eat More Often: Low-fat or fat-free milk, low-fat plain or light yogurt, reduced-fat or part-skim cheese.  Eat Less Often/Avoid: Milk (whole, 2%).Whole milk yogurt. Full-fat cheeses. Nuts, Seeds, and Legumes (4 to 5 servings per week)  Eat More Often: All without added salt.  Eat Less Often/Avoid: Salted nuts and seeds, canned beans with added salt. Fats and Sweets (limited)  Eat More Often: Vegetable oils, tub margarines without trans fats, sugar-free gelatin. Mayonnaise and salad dressings.  Eat Less Often/Avoid: Coconut oils, palm oils, butter, stick margarine, cream, half and half, cookies, candy, pie. FOR MORE INFORMATION The Dash Diet Eating Plan: www.dashdiet.org Document Released: 07/03/2011 Document Revised: 10/06/2011 Document Reviewed: 07/03/2011 [facility] Patient Information 2014 [place], [place].

## 2013-09-06 NOTE — Assessment & Plan Note (Signed)
Discussed primary prevention Still prefers no statin (unless LDL goes up a lot)

## 2013-09-06 NOTE — Progress Notes (Signed)
Subjective:    Patient ID: Jason Gross, male    DOB: 05-Mar-1956, 58 y.o.   MRN: [id number]  HPI Here for physical Weight is up 5# from last year---discussed this  Still on BP med No problems with this  Zyrtec for allergies Works fine  Current Outpatient Prescriptions on File Prior to Visit  Medication Sig Dispense Refill  . cetirizine (ZYRTEC) 10 MG tablet Take 10 mg by mouth daily.        Marland Kitchen lisinopril-hydrochlorothiazide (PRINZIDE,ZESTORETIC) 10-12.5 MG per tablet TAKE ONE TABLET BY MOUTH EVERY DAY  90 tablet  3   No current facility-administered medications on file prior to visit.    No Known Allergies  Past Medical History  Diagnosis Date  . Hypertension   . Allergy   . Hyperlipidemia   . GERD (gastroesophageal reflux disease)     No past surgical history on file.  Family History  Problem Relation Age of Onset  . Healthy Mother   . Coronary artery disease Father   . Diabetes Sister   . Diabetes Maternal Uncle   . Coronary artery disease Maternal Grandfather   . Diabetes Maternal Grandfather   . Hypertension Neg Hx   . Cancer Neg Hx     History   Social History  . Marital Status: Married    Spouse Name: N/A    Number of Children: 1  . Years of Education: N/A   Occupational History  . 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 Smoking status: Former Smoker    Quit date: 07/28/1968  . Smokeless tobacco: Never Used  . Alcohol Use: Yes     Comment: occasional  . Drug Use: No  . Sexual Activity: Not on file   Other Topics Concern  . Not on file   Social History Narrative  . No narrative on file   Review of Systems  Constitutional: Positive for unexpected weight change. Negative for fatigue.       Wears seat belt  HENT: Positive for dental problem. Negative for hearing loss and tinnitus.        Implants are fine--some surrounding teeth may need to be replaced over time  Eyes: Negative for visual disturbance.       No diplopia or  unilateral vision loss  Respiratory: Positive for cough. Negative for chest tightness and shortness of breath.        Rare cough-- relates to allergies  Cardiovascular: Negative for chest pain, palpitations and leg swelling.  Gastrointestinal: Negative for nausea, vomiting, abdominal pain, constipation and blood in stool.       No heartburn  Endocrine: Negative for cold intolerance and heat intolerance.  Genitourinary: Negative for urgency, frequency and difficulty urinating.       No sexual problems  Musculoskeletal: Positive for arthralgias. Negative for back pain and joint swelling.       Some right shoulder pain---occ ibuprofen helps  Skin: Positive for rash.       Rash on hands---dermatologist gave lotion that helped  Allergic/Immunologic: Positive for environmental allergies. Negative for immunocompromised state.  Neurological: Negative for dizziness, syncope, weakness, light-headedness, numbness and headaches.  Hematological: Negative for adenopathy. Does not bruise/bleed easily.  Psychiatric/Behavioral: Negative for sleep disturbance and dysphoric mood. The patient is not nervous/anxious.        Objective:   Physical Exam  Constitutional: He is oriented to person, place, and time. He appears well-developed and well-nourished. No distress.  HENT:  Head: Normocephalic and  atraumatic.  Right Ear: External ear normal.  Left Ear: External ear normal.  Mouth/Throat: Oropharynx is clear and moist. No oropharyngeal exudate.  Eyes: Conjunctivae and EOM are normal. Pupils are equal, round, and reactive to light.  Neck: Normal range of motion. Neck supple. No thyromegaly present.  Cardiovascular: Normal rate, regular rhythm, normal heart sounds and intact distal pulses.  Exam reveals no gallop.   No murmur heard. Pulmonary/Chest: Effort normal and breath sounds normal. No respiratory distress. He has no wheezes. He has no rales.  Abdominal: Soft. There is no tenderness.    Musculoskeletal: He exhibits no edema and no tenderness.  Lymphadenopathy:    He has no cervical adenopathy.  Neurological: He is alert and oriented to person, place, and time.  Skin: No rash noted. No erythema.  Psychiatric: He has a normal mood and affect. His behavior is normal.          Assessment & Plan:

## 2013-09-06 NOTE — Assessment & Plan Note (Signed)
BP Readings from Last 3 Encounters:  09/06/13 110/80  08/27/12 122/70  01/15/11 130/80   Good control Due for labs

## 2013-09-07 ENCOUNTER — Telehealth: Payer: Self-pay | Admitting: Internal Medicine

## 2013-09-07 NOTE — Telephone Encounter (Signed)
Relevant patient education mailed to patient.  

## 2013-09-09 ENCOUNTER — Encounter: Payer: Self-pay | Admitting: *Deleted

## 2013-10-20 ENCOUNTER — Other Ambulatory Visit (INDEPENDENT_AMBULATORY_CARE_PROVIDER_SITE_OTHER): Payer: PRIVATE HEALTH INSURANCE

## 2013-10-20 DIAGNOSIS — Z1211 Encounter for screening for malignant neoplasm of colon: Secondary | ICD-10-CM

## 2013-10-20 LAB — FECAL OCCULT BLOOD, IMMUNOCHEMICAL: Fecal Occult Bld: NEGATIVE

## 2013-10-25 ENCOUNTER — Encounter: Payer: Self-pay | Admitting: *Deleted

## 2013-11-03 ENCOUNTER — Other Ambulatory Visit: Payer: Self-pay | Admitting: Internal Medicine

## 2014-06-19 ENCOUNTER — Other Ambulatory Visit: Payer: Self-pay | Admitting: Internal Medicine

## 2014-09-06 ENCOUNTER — Encounter: Payer: PRIVATE HEALTH INSURANCE | Admitting: Internal Medicine

## 2014-09-20 ENCOUNTER — Encounter: Payer: PRIVATE HEALTH INSURANCE | Admitting: Internal Medicine

## 2014-12-09 ENCOUNTER — Other Ambulatory Visit: Payer: Self-pay | Admitting: Internal Medicine

## 2015-03-13 ENCOUNTER — Other Ambulatory Visit: Payer: Self-pay | Admitting: Internal Medicine

## 2015-04-16 ENCOUNTER — Other Ambulatory Visit: Payer: Self-pay | Admitting: Internal Medicine

## 2015-04-16 NOTE — Telephone Encounter (Signed)
Pt needs and appt, can refill until his appt .left message to have patient return my call.

## 2016-10-27 ENCOUNTER — Encounter: Payer: Self-pay | Admitting: *Deleted

## 2016-10-27 ENCOUNTER — Emergency Department
Admission: EM | Admit: 2016-10-27 | Discharge: 2016-10-28 | Disposition: A | Discharge status: Home / Self Care | Payer: 59 | Attending: Emergency Medicine | Admitting: Emergency Medicine

## 2016-10-27 DIAGNOSIS — R112 Nausea with vomiting, unspecified: Secondary | ICD-10-CM

## 2016-10-27 DIAGNOSIS — I10 Essential (primary) hypertension: Secondary | ICD-10-CM | POA: Insufficient documentation

## 2016-10-27 DIAGNOSIS — E876 Hypokalemia: Secondary | ICD-10-CM | POA: Insufficient documentation

## 2016-10-27 LAB — CBC
HCT: 45.7 % (ref 40.0–52.0)
Hemoglobin: 16 g/dL (ref 13.0–18.0)
MCH: 28.6 pg (ref 26.0–34.0)
MCHC: 35 g/dL (ref 32.0–36.0)
MCV: 81.8 fL (ref 80.0–100.0)
PLATELETS: 224 10*3/uL (ref 150–440)
RBC: 5.59 MIL/uL (ref 4.40–5.90)
RDW: 13.5 % (ref 11.5–14.5)
WBC: 11.7 10*3/uL — ABNORMAL HIGH (ref 3.8–10.6)

## 2016-10-27 MED ORDER — SODIUM CHLORIDE 0.9 % IV BOLUS (SEPSIS)
1000.0000 mL | Freq: Once | INTRAVENOUS | Status: AC
  Administered 2016-10-27: 1000 mL via INTRAVENOUS

## 2016-10-27 MED ORDER — ONDANSETRON HCL 4 MG/2ML IJ SOLN
4.0000 mg | Freq: Once | INTRAMUSCULAR | Status: AC
  Administered 2016-10-27: 4 mg via INTRAVENOUS
  Filled 2016-10-27: qty 2

## 2016-10-27 NOTE — ED Provider Notes (Signed)
[facility] Emergency Department Provider Note  ___________________________________________   First MD Initiated Contact with Patient 10/27/16 2315     (approximate)  I have reviewed the triage vital signs and the nursing notes.   HISTORY  Chief Complaint Dizziness  HPI Jason Gross is a 61 y.o. male who presents to the emergency department for evaluation of sudden onset of vomiting about 2 1/2 hours ago after eating some BBQ. Family also ate the BBQ, but he is the only one that got sick. 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 He states that he got hot and tired, but was determined to finish the job. He is curious if this caused him to vomit. He states that prior to eating, he had no chest pain, nausea, or diarrhea. He did have some diaphoresis, but was working outside. He denies any cardiac history and states that his only past medical history is hypertension and high cholesterol which is been well controlled on medications.   Past Medical History:  Diagnosis Date  . Allergy   . GERD (gastroesophageal reflux disease)   . Hyperlipidemia   . Hypertension     Patient Active Problem List   Diagnosis Date Noted  . Routine general medical examination at a health care facility 01/15/2011  . DYSHIDROTIC ECZEMA 07/17/2009  . HYPERLIPIDEMIA 01/01/2009  . ACTINIC KERATOSIS 01/01/2009  . HYPERTENSION 05/29/2008  . ALLERGIC RHINITIS 05/29/2008  . GERD 05/29/2008    No past surgical history on file.  Prior to Admission medications   Medication Sig Start Date End Date Taking? Authorizing Provider  cetirizine (ZYRTEC) 10 MG tablet Take 10 mg by mouth daily.      Historical Provider, MD  lisinopril-hydrochlorothiazide (PRINZIDE,ZESTORETIC) 10-12.5 MG per tablet TAKE 1 TABLET DAILY .Marland KitchenMarland KitchenNEED OFFICE VIST FOR ADDITIONAL REFILLS!! 03/13/15   Venia Carbon, MD  ondansetron (ZOFRAN-ODT) 4 MG  disintegrating tablet Take 1 tablet (4 mg total) by mouth every 8 (eight) hours as needed for nausea or vomiting. 10/28/16   Victorino Dike, FNP    Allergies Patient has no known allergies.  Family History  Problem Relation Age of Onset  . Healthy Mother   . Coronary artery disease Father   . Diabetes Sister   . Diabetes Maternal Uncle   . Coronary artery disease Maternal Grandfather   . Diabetes Maternal Grandfather   . Hypertension Neg Hx   . Cancer Neg Hx     Social History Social History  Substance Use Topics  . Smoking status: Never Smoker  . Smokeless tobacco: Never Used  . Alcohol use No     Comment: occasional    Review of Systems Constitutional: No fever/chills Eyes: No visual changes. ENT: No sore throat. Cardiovascular: Denies chest pain. Respiratory: Denies shortness of breath. Gastrointestinal: No abdominal pain.  Positive for nausea and vomiting. No diarrhea.. Genitourinary: Negative for dysuria. Musculoskeletal: Negative for back pain. Skin: Negative for rash. Neurological: Negative for headaches, focal weakness or numbness.  10-point ROS otherwise negative.  ____________________________________________   PHYSICAL EXAM:  VITAL SIGNS: ED Triage Vitals  Enc Vitals Group     BP 10/27/16 2308 (!) 172/78     Pulse Rate 10/27/16 2308 (!) 57     Resp 10/27/16 2308 20     Temp 10/27/16 2308 97.5 F (36.4 C)     Temp Source 10/27/16 2308 Oral     SpO2 10/27/16 2308 100 %  Weight 10/27/16 2304 219 lb (99.3 kg)     Height 10/27/16 2304 5\' 9"  (1.753 m)     Head Circumference --      Peak Flow --      Pain Score 10/27/16 2304 0     Pain Loc --      Pain Edu? --      Excl. in [place]? --     Constitutional: Alert and oriented. Well appearing and in no acute distress. Eyes: Conjunctivae are normal. PERRL. EOMI. Head: Atraumatic. Nose: No congestion/rhinnorhea. Mouth/Throat: Mucous membranes are moist.  Oropharynx non-erythematous. Neck: No stridor.    Cardiovascular: Normal rate, regular rhythm. Grossly normal heart sounds.  Good peripheral circulation. Respiratory: Normal respiratory effort.  No retractions. Lungs CTAB. Gastrointestinal: Soft and nontender. No distention. No abdominal bruits. No CVA tenderness. Musculoskeletal: No lower extremity tenderness nor edema.  No joint effusions. Neurologic:  Normal speech and language. No gross focal neurologic deficits are appreciated. No gait instability. Skin:  Skin is warm, Pale and diaphoretic. No rash noted. Psychiatric: Mood and affect are normal. Speech and behavior are normal.  ____________________________________________   LABS (all labs ordered are listed, but only abnormal results are displayed)  Labs Reviewed  BASIC METABOLIC PANEL - Abnormal; Notable for the following:       Result Value   Potassium 2.8 (*)    Chloride 100 (*)    Glucose, Bld 152 (*)    All other components within normal limits  CBC - Abnormal; Notable for the following:    WBC 11.7 (*)    All other components within normal limits  TROPONIN I  HEPATIC FUNCTION PANEL  LIPASE, BLOOD  URINALYSIS, COMPLETE (UACMP) WITH MICROSCOPIC   ____________________________________________  EKG   ____________________________________________  RADIOLOGY  Chest x-ray negative for acute cardiopulmonary abnormality. Right hemidiaphragm and has slightly elevated. ____________________________________________   PROCEDURES  Procedure(s) performed: None  Procedures  Critical Care performed: No  ____________________________________________   INITIAL IMPRESSION / ASSESSMENT AND PLAN / ED COURSE   61 year old male presenting to the emergency department for sudden onset of vomiting. He reports that he has not felt ill throughout the day. No one else in the house has been sick and no one else got sick after eating barbecue. His wife does report that they recently returned from the beach, but no sick contacts to  their knowledge. ----------------------------------------- 12:51 AM on 10/28/2016 -----------------------------------------  Patient denies nausea and denies vomiting since arrival to the emergency department. Potassium noted to be 2.8. K-Dur to be ordered for potassium replacement. 1 L normal saline is infused.  ----------------------------------------- 2:29 AM on 10/28/2016 -----------------------------------------  Patient to be discharged home. No further vomiting after 1 L of normal saline and 4 of Zofran. He will be prescribed Zofran ODT to be taken as needed. He was encouraged to start with a bland diet and increase as tolerated. He was advised to call and schedule an appointment with his primary care provider for recheck of the potassium in the next few days. He was instructed to return to the emergency department for symptoms that change or worsen if he is unable to see his primary care provider.   Pertinent labs & imaging results that were available during my care of the patient were reviewed by me and considered in my medical decision making (see chart for details). ____________________________________________   FINAL CLINICAL IMPRESSION(S) / ED DIAGNOSES  Final diagnoses:  Non-intractable vomiting with nausea, unspecified vomiting type  Hypokalemia  NEW MEDICATIONS STARTED DURING THIS VISIT:  New Prescriptions   ONDANSETRON (ZOFRAN-ODT) 4 MG DISINTEGRATING TABLET    Take 1 tablet (4 mg total) by mouth every 8 (eight) hours as needed for nausea or vomiting.     Note:  This document was prepared using Dragon voice recognition software and may include unintentional dictation errors.    Victorino Dike, FNP 10/28/16 0230

## 2016-10-27 NOTE — ED Triage Notes (Signed)
Pt to triage via wheelchair  Pt has dizziness with vomiting tonight.  Pt denies chest pain.  No abd pain. Pt pale.

## 2016-10-27 NOTE — ED Notes (Signed)
Pt notified urine sample is requested. Pt verbalizes understanding of this.

## 2016-10-28 ENCOUNTER — Emergency Department: Payer: 59

## 2016-10-28 ENCOUNTER — Telehealth: Payer: Self-pay

## 2016-10-28 LAB — BASIC METABOLIC PANEL
ANION GAP: 11 (ref 5–15)
BUN: 15 mg/dL (ref 6–20)
CHLORIDE: 100 mmol/L — AB (ref 101–111)
CO2: 27 mmol/L (ref 22–32)
Calcium: 9.5 mg/dL (ref 8.9–10.3)
Creatinine, Ser: 1.01 mg/dL (ref 0.61–1.24)
GFR calc Af Amer: >60 mL/min (ref >60)
Glucose, Bld: 152 mg/dL — ABNORMAL HIGH (ref 65–99)
POTASSIUM: 2.8 mmol/L — AB (ref 3.5–5.1)
Sodium: 138 mmol/L (ref 135–145)

## 2016-10-28 LAB — LIPASE, BLOOD: Lipase: 19 U/L (ref 11–51)

## 2016-10-28 LAB — HEPATIC FUNCTION PANEL
ALBUMIN: 4.7 g/dL (ref 3.5–5.0)
ALT: 31 U/L (ref 17–63)
AST: 32 U/L (ref 15–41)
Alkaline Phosphatase: 56 U/L (ref 38–126)
BILIRUBIN DIRECT: 0.1 mg/dL (ref 0.1–0.5)
BILIRUBIN TOTAL: 0.9 mg/dL (ref 0.3–1.2)
Indirect Bilirubin: 0.8 mg/dL (ref 0.3–0.9)
Total Protein: 8 g/dL (ref 6.5–8.1)

## 2016-10-28 LAB — TROPONIN I: Troponin I: <0.03 ng/mL (ref <0.03)

## 2016-10-28 MED ORDER — ONDANSETRON 4 MG PO TBDP: 4.0000 mg | ORAL_TABLET | Freq: Three times a day (TID) | ORAL | 0 refills | Status: DC | PRN

## 2016-10-28 MED ORDER — POTASSIUM CHLORIDE CRYS ER 20 MEQ PO TBCR
40.0000 meq | EXTENDED_RELEASE_TABLET | Freq: Once | ORAL | Status: AC
  Administered 2016-10-28: 40 meq via ORAL
  Filled 2016-10-28: qty 2

## 2016-10-28 NOTE — Discharge Instructions (Signed)
Please follow up with your primary care provider  in a few days to recheck your potassium. Drink fluids tomorrow and gradually progress to bland foods. Return to the ER for symptoms that change or worsen or for new concerns.

## 2016-10-28 NOTE — Telephone Encounter (Signed)
Spoke to pt. He said he was a little better today, but still dizzy at times. He will see how he is tonight. If no more improvement in the morning, he will call for an appt

## 2016-10-28 NOTE — ED Provider Notes (Addendum)
Medical screening examination/treatment/procedure(s) were conducted as a shared visit with non-physician practitioner(s) and myself.  I personally evaluated the patient during the encounter.  Briefly the patient is a 61 year old man who comes to the emergency department with vomiting shortly after eating barbecue. He was given antiemetics and hydrated here with a significant improvement in his symptoms. Labs are reassuring. Likely gastroenteritis. Stable for discharge   Darel Hong, MD 10/28/16 [phone]   ED ECG REPORT I, Darel Hong, the attending physician, personally viewed and interpreted this ECG.  Date: 11/14/2016 Rate: 59 Rhythm: Sinus bradycardia QRS Axis: Leftward Intervals: normal ST/T Wave abnormalities: Nonspecific ST changes but no frank elevation Conduction Disturbances: none Narrative Interpretation: Borderline    Darel Hong, MD 11/14/16 1358

## 2016-10-28 NOTE — ED Notes (Signed)
Patient transported to X-ray 

## 2016-10-31 ENCOUNTER — Ambulatory Visit (INDEPENDENT_AMBULATORY_CARE_PROVIDER_SITE_OTHER): Payer: 59 | Admitting: Internal Medicine

## 2016-10-31 ENCOUNTER — Encounter: Payer: Self-pay | Admitting: Internal Medicine

## 2016-10-31 VITALS — BP 138/88 | HR 59 | Temp 97.4°F | Ht 71.0 in | Wt 212.0 lb

## 2016-10-31 DIAGNOSIS — R42 Dizziness and giddiness: Secondary | ICD-10-CM

## 2016-10-31 MED ORDER — DIAZEPAM 5 MG PO TABS: 2.5000 mg | ORAL_TABLET | Freq: Three times a day (TID) | ORAL | 1 refills | Status: DC | PRN

## 2016-10-31 MED ORDER — MECLIZINE HCL 25 MG PO TABS: 25.0000 mg | ORAL_TABLET | Freq: Three times a day (TID) | ORAL | 1 refills | Status: DC | PRN

## 2016-10-31 MED ORDER — PROMETHAZINE HCL 25 MG/ML IJ SOLN
50.0000 mg | Freq: Once | INTRAMUSCULAR | Status: AC
  Administered 2016-10-31: 50 mg via INTRAMUSCULAR

## 2016-10-31 NOTE — Progress Notes (Signed)
Pre visit review using our clinic review tool, if applicable. No additional management support is needed unless otherwise documented below in the visit note. 

## 2016-10-31 NOTE — Addendum Note (Signed)
Addended by: Pilar Grammes on: 10/31/2016 10:45 AM   Modules accepted: Orders

## 2016-10-31 NOTE — Assessment & Plan Note (Signed)
History suggests vestibular etiology--not stroke Will give phenergan here Rx for meclizine and diazepam ENT next week if not improving

## 2016-10-31 NOTE — Progress Notes (Signed)
   Subjective:    Patient ID: Jason Gross, male    DOB: Aug 19, 1955, 61 y.o.   MRN: [id number]  HPI Here with wife due to vomiting  Started 4 days ago in afternoon Doesn't think it was the barbecue---no one else sick Nausea, true severe vertigo --then vomiting Some improvement in vertigo--was able to work yesterday Then worsened again  Ondansetron not really helpful---did help some before  Some headache early this morning--better now No problems walking or with coordination  Current Outpatient Prescriptions on File Prior to Visit  Medication Sig Dispense Refill  . cetirizine (ZYRTEC) 10 MG tablet Take 10 mg by mouth daily.      . ondansetron (ZOFRAN-ODT) 4 MG disintegrating tablet Take 1 tablet (4 mg total) by mouth every 8 (eight) hours as needed for nausea or vomiting. 20 tablet 0   No current facility-administered medications on file prior to visit.     No Known Allergies  Past Medical History:  Diagnosis Date  . Allergy   . GERD (gastroesophageal reflux disease)   . Hyperlipidemia   . Hypertension     No past surgical history on file.  Family History  Problem Relation Age of Onset  . Healthy Mother   . Coronary artery disease Father   . Diabetes Sister   . Diabetes Maternal Uncle   . Coronary artery disease Maternal Grandfather   . Diabetes Maternal Grandfather   . Hypertension Neg Hx   . Cancer Neg Hx     Social History   Social History  . Marital status: Married    Spouse name: N/A  . Number of children: 1  . Years of education: N/A   Occupational History  . 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 Smokeless tobacco: Never Used  . Alcohol use No     Comment: occasional  . Drug use: No  . Sexual activity: Not on file   Other Topics Concern  . Not on file   Social History Narrative  . No narrative on file   Review of Systems No fever No abdominal pain Some tinnitus-- started Monday Hearing  is "like in a barrel" Bowels are okay Appetite is off some---but okay when not dizzy    Objective:   Physical Exam  Constitutional:  Uncomfortable just opening eyes Recurrent retching and vomiting  HENT:  Mouth/Throat: Oropharynx is clear and moist. No oropharyngeal exudate.  TMs normal  Eyes: Pupils are equal, round, and reactive to light.  Neurological:  No focal weakness but too sick to walk, etc          Assessment & Plan:

## 2016-10-31 NOTE — Patient Instructions (Signed)
Set up a physical at your convenience

## 2016-11-04 ENCOUNTER — Telehealth: Payer: Self-pay | Admitting: Internal Medicine

## 2016-11-04 DIAGNOSIS — R42 Dizziness and giddiness: Secondary | ICD-10-CM

## 2016-11-04 NOTE — Telephone Encounter (Signed)
Pt wife called. Ford's vertigo is not doing any better except not nauseated- still very disoriented. She is requesting a referral for ENT, Dr Kathyrn Sheriff in [facility] at [facility]. Or Dr. Pryor Ochoa is OK.

## 2016-11-05 NOTE — Telephone Encounter (Signed)
Referral placed.

## 2016-11-07 ENCOUNTER — Other Ambulatory Visit: Payer: Self-pay | Admitting: Otolaryngology

## 2016-11-07 DIAGNOSIS — R221 Localized swelling, mass and lump, neck: Secondary | ICD-10-CM

## 2016-11-13 ENCOUNTER — Ambulatory Visit
Admission: RE | Admit: 2016-11-13 | Discharge: 2016-11-13 | Disposition: A | Discharge status: Home / Self Care | Payer: 59 | Source: Ambulatory Visit | Attending: Otolaryngology | Admitting: Otolaryngology

## 2016-11-13 DIAGNOSIS — K118 Other diseases of salivary glands: Secondary | ICD-10-CM | POA: Diagnosis not present

## 2016-11-13 DIAGNOSIS — I700 Atherosclerosis of aorta: Secondary | ICD-10-CM | POA: Diagnosis not present

## 2016-11-13 DIAGNOSIS — J320 Chronic maxillary sinusitis: Secondary | ICD-10-CM | POA: Diagnosis not present

## 2016-11-13 DIAGNOSIS — R221 Localized swelling, mass and lump, neck: Secondary | ICD-10-CM

## 2016-11-13 MED ORDER — IOPAMIDOL (ISOVUE-300) INJECTION 61%
75.0000 mL | Freq: Once | INTRAVENOUS | Status: AC | PRN
  Administered 2016-11-13: 75 mL via INTRAVENOUS

## 2016-11-19 ENCOUNTER — Other Ambulatory Visit: Payer: Self-pay | Admitting: Otolaryngology

## 2016-11-19 DIAGNOSIS — R221 Localized swelling, mass and lump, neck: Secondary | ICD-10-CM

## 2016-11-25 ENCOUNTER — Other Ambulatory Visit: Payer: Self-pay | Admitting: Radiology

## 2016-11-26 ENCOUNTER — Other Ambulatory Visit: Payer: Self-pay | Admitting: General Surgery

## 2016-11-26 ENCOUNTER — Other Ambulatory Visit: Payer: Self-pay | Admitting: Radiology

## 2016-11-27 ENCOUNTER — Ambulatory Visit
Admission: RE | Admit: 2016-11-27 | Discharge: 2016-11-27 | Disposition: A | Discharge status: Home / Self Care | Payer: 59 | Source: Ambulatory Visit | Attending: Otolaryngology | Admitting: Otolaryngology

## 2016-11-27 DIAGNOSIS — D490 Neoplasm of unspecified behavior of digestive system: Secondary | ICD-10-CM | POA: Diagnosis not present

## 2016-11-27 DIAGNOSIS — R221 Localized swelling, mass and lump, neck: Secondary | ICD-10-CM

## 2016-11-27 LAB — CBC
HEMATOCRIT: 44.8 % (ref 40.0–52.0)
HEMOGLOBIN: 15.7 g/dL (ref 13.0–18.0)
MCH: 28.3 pg (ref 26.0–34.0)
MCHC: 35.1 g/dL (ref 32.0–36.0)
MCV: 80.6 fL (ref 80.0–100.0)
Platelets: 250 10*3/uL (ref 150–440)
RBC: 5.55 MIL/uL (ref 4.40–5.90)
RDW: 13.8 % (ref 11.5–14.5)
WBC: 9.2 10*3/uL (ref 3.8–10.6)

## 2016-11-27 LAB — PROTIME-INR
INR: 0.98
PROTHROMBIN TIME: 13 s (ref 11.4–15.2)

## 2016-11-27 LAB — APTT: APTT: 28 s (ref 24–36)

## 2016-11-27 MED ORDER — MIDAZOLAM HCL 2 MG/2ML IJ SOLN
INTRAMUSCULAR | Status: AC | PRN
  Administered 2016-11-27: 1 mg via INTRAVENOUS

## 2016-11-27 MED ORDER — FENTANYL CITRATE (PF) 100 MCG/2ML IJ SOLN
INTRAMUSCULAR | Status: AC | PRN
  Administered 2016-11-27: 50 ug via INTRAVENOUS

## 2016-11-27 MED ORDER — SODIUM CHLORIDE 0.9 % IV SOLN
INTRAVENOUS | Status: DC
  Administered 2016-11-27: 14:00:00 via INTRAVENOUS

## 2016-11-27 NOTE — Discharge Instructions (Signed)
Needle Biopsy, Care After °Refer to this sheet in the next few weeks. These instructions provide you with information about caring for yourself after your procedure. Your health care provider may also give you more specific instructions. Your treatment has been planned according to current medical practices, but problems sometimes occur. Call your health care provider if you have any problems or questions after your procedure. °What can I expect after the procedure? °After your procedure, it is common to have soreness, bruising, or mild pain at the biopsy site. This should go away in a few days. °Follow these instructions at home: °· Rest as directed by your health care provider. °· Take medicines only as directed by your health care provider. °· There are many different ways to close and cover the biopsy site, including stitches (sutures), skin glue, and adhesive strips. Follow your health care provider's instructions about: °¨ Biopsy site care. °¨ Bandage (dressing) changes and removal. °¨ Biopsy site closure removal. °· Check your biopsy site every day for signs of infection. Watch for: °¨ Redness, swelling, or pain. °¨ Fluid, blood, or pus. °Contact a health care provider if: °· You have a fever. °· You have redness, swelling, or pain at the biopsy site that lasts longer than a few days. °· You have fluid, blood, or pus coming from the biopsy site. °· You feel nauseous. °· You vomit. °Get help right away if: °· You have shortness of breath. °· You have trouble breathing. °· You have chest pain. °· You feel dizzy or you faint. °· You have bleeding that does not stop with pressure or a bandage. °· You cough up blood. °· You have pain in your abdomen. °This information is not intended to replace advice given to you by your health care provider. Make sure you discuss any questions you have with your health care provider. °Document Released: 11/28/2014 Document Revised: 12/20/2015 Document Reviewed:  07/10/2014 °Elsevier Interactive Patient Education © 2017 [employer] ° °

## 2016-11-27 NOTE — Procedures (Signed)
Interventional Radiology Procedure Note  Procedure: US guided bx of left parotid mass.   Complications: None  Estimated Blood Loss: None  Recommendations: - DC home   Signed,  Criselda Peaches, MD

## 2016-11-27 NOTE — Progress Notes (Signed)
PASSWORD FOR WIFE TO CALL FOR ALL INFORMATION  HARDEE

## 2016-11-28 LAB — SURGICAL PATHOLOGY

## 2016-12-10 ENCOUNTER — Telehealth: Payer: Self-pay

## 2016-12-10 NOTE — Telephone Encounter (Signed)
Mrs Kaufhold left v/m; pt has not followed with Dr Silvio Pate about BP and has not been on BP med; pt seen 10/31/16 for vertigo. 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 Pt has had some issues with BP. Prior to surgery Mrs Davoli thinks pt needs to be back on BP medication. Request cb to pt.

## 2016-12-11 NOTE — Telephone Encounter (Signed)
Okay 

## 2016-12-11 NOTE — Telephone Encounter (Signed)
Could add for me at 9:30AM tomorrow if he doesn't go to ER You can try to reach him later

## 2016-12-11 NOTE — Telephone Encounter (Signed)
Patient scheduled appointment on 12/12/16 at 9:30.

## 2016-12-12 ENCOUNTER — Ambulatory Visit: Payer: 59 | Admitting: Internal Medicine

## 2016-12-12 ENCOUNTER — Encounter: Payer: Self-pay | Admitting: Internal Medicine

## 2016-12-12 ENCOUNTER — Ambulatory Visit (INDEPENDENT_AMBULATORY_CARE_PROVIDER_SITE_OTHER): Payer: 59 | Admitting: Internal Medicine

## 2016-12-12 VITALS — BP 170/110 | HR 60 | Temp 98.6°F | Wt 219.0 lb

## 2016-12-12 DIAGNOSIS — I10 Essential (primary) hypertension: Secondary | ICD-10-CM

## 2016-12-12 MED ORDER — LISINOPRIL-HYDROCHLOROTHIAZIDE 10-12.5 MG PO TABS: ORAL_TABLET | ORAL | 3 refills | Status: DC

## 2016-12-12 NOTE — Assessment & Plan Note (Signed)
BP Readings from Last 3 Encounters:  12/12/16 (!) 170/110  11/27/16 (!) 180/99  10/31/16 138/88   Repeat 182/112 on right Not sure why he suddenly has gone back up so much Will restart his med

## 2016-12-12 NOTE — Progress Notes (Signed)
   Subjective:    Patient ID: Jason Gross, male    DOB: 1955/12/20, 61 y.o.   MRN: [id number]  HPI Here due to elevated BP  Has parotid enlargement on the left Had biopsy and not cancer--but still needs to be removed (soon)  Had run out of the BP med Had checked at various times--like giving blood--and it was okay Now running higher  No chest pain No SOB No syncope No palpitations No edema  Current Outpatient Prescriptions on File Prior to Visit  Medication Sig Dispense Refill  . cetirizine (ZYRTEC) 10 MG tablet Take 10 mg by mouth daily.      . diazepam (VALIUM) 5 MG tablet Take 0.5-1 tablets (2.5-5 mg total) by mouth 3 (three) times daily as needed (for vertigo). 30 tablet 1  . ibuprofen (ADVIL,MOTRIN) 600 MG tablet Take 600 mg by mouth every 6 (six) hours as needed.    . meclizine (ANTIVERT) 25 MG tablet Take 1-2 tablets (25-50 mg total) by mouth 3 (three) times daily as needed for dizziness. 90 tablet 1   No current facility-administered medications on file prior to visit.     No Known Allergies  Past Medical History:  Diagnosis Date  . Allergy   . GERD (gastroesophageal reflux disease)   . Hyperlipidemia   . Hypertension     No past surgical history on file.  Family History  Problem Relation Age of Onset  . Healthy Mother   . Coronary artery disease Father   . Diabetes Sister   . Diabetes Maternal Uncle   . Coronary artery disease Maternal Grandfather   . Diabetes Maternal Grandfather   . Hypertension Neg Hx   . Cancer Neg Hx     Social History   Social History  . Marital status: Married    Spouse name: N/A  . Number of children: 1  . Years of education: N/A   Occupational History  . Machinist @ [employer] History Main Topics  . Smoking status: Never Smoker  . Smokeless tobacco: Never Used  . Alcohol use No     Comment: occasional  . Drug use: No  . Sexual activity: Yes   Other Topics Concern  . Not on file   Social History  Narrative  . No narrative on file   Review of Systems  Still some vertigo---not clear the meclizine does any good Slightly off balance when walking No nausea  Appetite is fine     Objective:   Physical Exam  Constitutional: He appears well-nourished. No distress.  Neck:  Non tender left parotid enlargement--firm  Cardiovascular: Normal rate, regular rhythm and normal heart sounds.  Exam reveals no gallop.   No murmur heard. Pulmonary/Chest: Effort normal and breath sounds normal. No respiratory distress. He has no wheezes. He has no rales.  Musculoskeletal: He exhibits no edema.          Assessment & Plan:

## 2016-12-26 ENCOUNTER — Ambulatory Visit: Payer: 59 | Admitting: Internal Medicine

## 2017-01-05 ENCOUNTER — Encounter
Admission: RE | Admit: 2017-01-05 | Discharge: 2017-01-05 | Disposition: A | Discharge status: Home / Self Care | Payer: 59 | Source: Ambulatory Visit | Attending: Otolaryngology | Admitting: Otolaryngology

## 2017-01-05 DIAGNOSIS — R221 Localized swelling, mass and lump, neck: Secondary | ICD-10-CM | POA: Diagnosis present

## 2017-01-05 DIAGNOSIS — Z833 Family history of diabetes mellitus: Secondary | ICD-10-CM | POA: Diagnosis not present

## 2017-01-05 DIAGNOSIS — Z8249 Family history of ischemic heart disease and other diseases of the circulatory system: Secondary | ICD-10-CM | POA: Diagnosis not present

## 2017-01-05 DIAGNOSIS — I10 Essential (primary) hypertension: Secondary | ICD-10-CM | POA: Diagnosis not present

## 2017-01-05 DIAGNOSIS — Z8349 Family history of other endocrine, nutritional and metabolic diseases: Secondary | ICD-10-CM | POA: Diagnosis not present

## 2017-01-05 DIAGNOSIS — D110 Benign neoplasm of parotid gland: Secondary | ICD-10-CM | POA: Diagnosis not present

## 2017-01-05 HISTORY — DX: Dizziness and giddiness: R42

## 2017-01-05 LAB — POTASSIUM: Potassium: 4.3 mmol/L (ref 3.5–5.1)

## 2017-01-05 NOTE — Patient Instructions (Signed)
  Your procedure is scheduled on: January 07, 2017 [facility]) Report to Same Day Surgery 2nd floor medical mall ([place] Entrance-take elevator on left to 2nd floor.  Check in with surgery information desk.) To find out your arrival time please call [phone] between 1PM - 3PM on January 06, 2017 [facility])   Remember: Instructions that are not followed completely may result in serious medical risk, up to and including death, or upon the discretion of your surgeon and anesthesiologist your surgery may need to be rescheduled.    _x___ 1. Do not eat food or drink liquids after midnight. No gum chewing or hard candies                             __x__ 2. No Alcohol for 24 hours before or after surgery.   __x__3. No Smoking for 24 prior to surgery.   ____  4. Bring all medications with you on the day of surgery if instructed.    __x__ 5. Notify your doctor if there is any change in your medical condition     (cold, fever, infections).     Do not wear jewelry, make-up, hairpins, clips or nail polish.  Do not wear lotions, powders, or perfumes.  Do not shave 48 hours prior to surgery. Men may shave face and neck.  Do not bring valuables to the hospital.    [facility] is not responsible for any belongings or valuables.               Contacts, dentures or bridgework may not be worn into surgery.  Leave your suitcase in the car. After surgery it may be brought to your room.  For patients admitted to the hospital, discharge time is determined by your treatment team                    Patients discharged the day of surgery will not be allowed to drive home.  You will need someone to drive you home and stay with you the night of your procedure.    Please read over the following fact sheets that you were given:   [facility] Preparing for Surgery and or MRSA Information   ___ Take anti-hypertensive (unless it includes a diuretic), cardiac, seizure, asthma,     anti-reflux and psychiatric  medicines. These include:  1.   2.  3.  4.  5.  6.  ____Fleets enema or Magnesium Citrate as directed.   ___ Use CHG Soap or sage wipes as directed on instruction sheet   ____ Use inhalers on the day of surgery and bring to hospital day of surgery  ____ Stop Metformin and Janumet 2 days prior to surgery.    ____ Take 1/2 of usual insulin dose the night before surgery and none on the morning surgery      _x___ Follow recommendations from Cardiologist, Pulmonologist or PCP regarding          stopping Aspirin, Coumadin, Plavix ,Eliquis, Effient, or Pradaxa, and Pletal.  X____Stop Anti-inflammatories such as Advil, Aleve, Ibuprofen, Motrin, Naproxen, Naprosyn, Goodies powders or aspirin products. OK to take Tylenol    _x___ Stop supplements until after surgery.  But may continue Vitamin D, Vitamin B, and multivitamin.        ____ Bring C-Pap to the hospital.

## 2017-01-07 ENCOUNTER — Ambulatory Visit: Payer: 59 | Admitting: Certified Registered Nurse Anesthetist

## 2017-01-07 ENCOUNTER — Encounter: Payer: Self-pay | Admitting: *Deleted

## 2017-01-07 ENCOUNTER — Observation Stay
Admission: RE | Admit: 2017-01-07 | Discharge: 2017-01-07 | Disposition: A | Discharge status: Home / Self Care | Payer: 59 | Source: Ambulatory Visit | Attending: Otolaryngology | Admitting: Otolaryngology

## 2017-01-07 ENCOUNTER — Encounter
Admission: RE | Disposition: A | Discharge status: Home / Self Care | Payer: Self-pay | Source: Ambulatory Visit | Attending: Otolaryngology

## 2017-01-07 DIAGNOSIS — Z8249 Family history of ischemic heart disease and other diseases of the circulatory system: Secondary | ICD-10-CM | POA: Insufficient documentation

## 2017-01-07 DIAGNOSIS — K118 Other diseases of salivary glands: Secondary | ICD-10-CM | POA: Diagnosis present

## 2017-01-07 DIAGNOSIS — I10 Essential (primary) hypertension: Secondary | ICD-10-CM | POA: Insufficient documentation

## 2017-01-07 DIAGNOSIS — Z8349 Family history of other endocrine, nutritional and metabolic diseases: Secondary | ICD-10-CM | POA: Insufficient documentation

## 2017-01-07 DIAGNOSIS — D110 Benign neoplasm of parotid gland: Principal | ICD-10-CM | POA: Insufficient documentation

## 2017-01-07 DIAGNOSIS — Z833 Family history of diabetes mellitus: Secondary | ICD-10-CM | POA: Insufficient documentation

## 2017-01-07 HISTORY — PX: PAROTIDECTOMY: SHX2163

## 2017-01-07 SURGERY — EXCISION, PAROTID GLAND
Anesthesia: General | Laterality: Left | Wound class: Clean

## 2017-01-07 MED ORDER — KCL IN DEXTROSE-NACL 20-5-0.45 MEQ/L-%-% IV SOLN
INTRAVENOUS | Status: DC
  Administered 2017-01-07: 13:00:00 via INTRAVENOUS
  Filled 2017-01-07 (×3): qty 1000

## 2017-01-07 MED ORDER — FAMOTIDINE 20 MG PO TABS
20.0000 mg | ORAL_TABLET | Freq: Once | ORAL | Status: AC
  Administered 2017-01-07: 20 mg via ORAL

## 2017-01-07 MED ORDER — PHENYLEPHRINE HCL 10 MG/ML IJ SOLN
INTRAMUSCULAR | Status: AC
  Filled 2017-01-07: qty 1

## 2017-01-07 MED ORDER — FENTANYL CITRATE (PF) 100 MCG/2ML IJ SOLN
INTRAMUSCULAR | Status: DC | PRN
  Administered 2017-01-07 (×2): 50 ug via INTRAVENOUS

## 2017-01-07 MED ORDER — ONDANSETRON HCL 4 MG PO TABS: 4.0000 mg | ORAL_TABLET | ORAL | Status: DC | PRN

## 2017-01-07 MED ORDER — LIDOCAINE HCL (CARDIAC) 20 MG/ML IV SOLN
INTRAVENOUS | Status: DC | PRN
  Administered 2017-01-07: 100 mg via INTRAVENOUS

## 2017-01-07 MED ORDER — HYDROCODONE-ACETAMINOPHEN 5-325 MG PO TABS: 1.0000 | ORAL_TABLET | ORAL | 0 refills | Status: DC | PRN

## 2017-01-07 MED ORDER — PROPOFOL 10 MG/ML IV BOLUS
INTRAVENOUS | Status: AC
  Filled 2017-01-07: qty 20

## 2017-01-07 MED ORDER — BACITRACIN ZINC 500 UNIT/GM EX OINT
TOPICAL_OINTMENT | CUTANEOUS | Status: AC
  Filled 2017-01-07: qty 28.35

## 2017-01-07 MED ORDER — DEXAMETHASONE SODIUM PHOSPHATE 10 MG/ML IJ SOLN
INTRAMUSCULAR | Status: AC
  Filled 2017-01-07: qty 1

## 2017-01-07 MED ORDER — ACETAMINOPHEN 160 MG/5ML PO SOLN
650.0000 mg | ORAL | Status: DC | PRN
  Filled 2017-01-07: qty 20.3

## 2017-01-07 MED ORDER — MIDAZOLAM HCL 2 MG/2ML IJ SOLN
INTRAMUSCULAR | Status: DC | PRN
  Administered 2017-01-07: 2 mg via INTRAVENOUS

## 2017-01-07 MED ORDER — SODIUM CHLORIDE 0.9 % IV SOLN
INTRAVENOUS | Status: DC | PRN
  Administered 2017-01-07: 50 ug/min via INTRAVENOUS

## 2017-01-07 MED ORDER — SUCCINYLCHOLINE CHLORIDE 20 MG/ML IJ SOLN
INTRAMUSCULAR | Status: DC | PRN
  Administered 2017-01-07: 80 mg via INTRAVENOUS

## 2017-01-07 MED ORDER — GLYCOPYRROLATE 0.2 MG/ML IJ SOLN
INTRAMUSCULAR | Status: DC | PRN
  Administered 2017-01-07: 0.2 mg via INTRAVENOUS

## 2017-01-07 MED ORDER — ONDANSETRON HCL 4 MG/2ML IJ SOLN
INTRAMUSCULAR | Status: DC | PRN
  Administered 2017-01-07: 4 mg via INTRAVENOUS

## 2017-01-07 MED ORDER — EPHEDRINE SULFATE 50 MG/ML IJ SOLN
INTRAMUSCULAR | Status: AC
  Filled 2017-01-07: qty 1

## 2017-01-07 MED ORDER — DOCUSATE SODIUM 100 MG PO CAPS: 100.0000 mg | ORAL_CAPSULE | Freq: Two times a day (BID) | ORAL | Status: DC

## 2017-01-07 MED ORDER — MORPHINE SULFATE (PF) 2 MG/ML IV SOLN
2.0000 mg | INTRAVENOUS | Status: DC | PRN
Start: 2017-01-07 — End: 2017-01-07

## 2017-01-07 MED ORDER — FAMOTIDINE 20 MG PO TABS
ORAL_TABLET | ORAL | Status: AC
  Administered 2017-01-07: 20 mg via ORAL
  Filled 2017-01-07: qty 1

## 2017-01-07 MED ORDER — SUCCINYLCHOLINE CHLORIDE 20 MG/ML IJ SOLN
INTRAMUSCULAR | Status: AC
  Filled 2017-01-07: qty 1

## 2017-01-07 MED ORDER — LIDOCAINE HCL (PF) 2 % IJ SOLN
INTRAMUSCULAR | Status: AC
  Filled 2017-01-07: qty 2

## 2017-01-07 MED ORDER — GLYCOPYRROLATE 0.2 MG/ML IJ SOLN
INTRAMUSCULAR | Status: AC
  Filled 2017-01-07: qty 1

## 2017-01-07 MED ORDER — ONDANSETRON HCL 4 MG/2ML IJ SOLN: 4.0000 mg | INTRAMUSCULAR | Status: DC | PRN

## 2017-01-07 MED ORDER — LIDOCAINE-EPINEPHRINE (PF) 1 %-1:200000 IJ SOLN
INTRAMUSCULAR | Status: AC
  Filled 2017-01-07: qty 30

## 2017-01-07 MED ORDER — EPHEDRINE SULFATE 50 MG/ML IJ SOLN
INTRAMUSCULAR | Status: DC | PRN
  Administered 2017-01-07: 15 mg via INTRAVENOUS
  Administered 2017-01-07: 10 mg via INTRAVENOUS

## 2017-01-07 MED ORDER — PROPOFOL 10 MG/ML IV BOLUS
INTRAVENOUS | Status: DC | PRN
  Administered 2017-01-07: 160 mg via INTRAVENOUS

## 2017-01-07 MED ORDER — REMIFENTANIL HCL 1 MG IV SOLR
INTRAVENOUS | Status: AC
  Filled 2017-01-07: qty 3000

## 2017-01-07 MED ORDER — BACITRACIN ZINC 500 UNIT/GM EX OINT
1.0000 "application " | TOPICAL_OINTMENT | Freq: Three times a day (TID) | CUTANEOUS | Status: DC
  Administered 2017-01-07: 1 via TOPICAL

## 2017-01-07 MED ORDER — ONDANSETRON HCL 4 MG/2ML IJ SOLN
INTRAMUSCULAR | Status: AC
  Filled 2017-01-07: qty 2

## 2017-01-07 MED ORDER — MIDAZOLAM HCL 2 MG/2ML IJ SOLN
INTRAMUSCULAR | Status: AC
  Filled 2017-01-07: qty 2

## 2017-01-07 MED ORDER — LACTATED RINGERS IV SOLN
INTRAVENOUS | Status: DC
  Administered 2017-01-07: 06:00:00 via INTRAVENOUS

## 2017-01-07 MED ORDER — FENTANYL CITRATE (PF) 100 MCG/2ML IJ SOLN
INTRAMUSCULAR | Status: AC
  Filled 2017-01-07: qty 2

## 2017-01-07 MED ORDER — SODIUM CHLORIDE 0.9 % IV SOLN
INTRAVENOUS | Status: DC | PRN
  Administered 2017-01-07: .1 ug/kg/min via INTRAVENOUS

## 2017-01-07 MED ORDER — SEVOFLURANE IN SOLN
RESPIRATORY_TRACT | Status: AC
  Filled 2017-01-07: qty 250

## 2017-01-07 MED ORDER — ACETAMINOPHEN 650 MG RE SUPP: 650.0000 mg | RECTAL | Status: DC | PRN

## 2017-01-07 MED ORDER — DEXAMETHASONE SODIUM PHOSPHATE 10 MG/ML IJ SOLN
INTRAMUSCULAR | Status: DC | PRN
  Administered 2017-01-07: 10 mg via INTRAVENOUS

## 2017-01-07 MED ORDER — LIDOCAINE-EPINEPHRINE (PF) 1 %-1:200000 IJ SOLN
INTRAMUSCULAR | Status: DC | PRN
  Administered 2017-01-07: 8 mL

## 2017-01-07 MED ORDER — HYDROCODONE-ACETAMINOPHEN 5-325 MG PO TABS: 1.0000 | ORAL_TABLET | ORAL | Status: DC | PRN

## 2017-01-07 SURGICAL SUPPLY — 33 items
ADHESIVE MASTISOL STRL (MISCELLANEOUS) ×3 IMPLANT
BLADE SURG 15 STRL LF DISP TIS (BLADE) ×1 IMPLANT
BLADE SURG 15 STRL SS (BLADE) ×2
CORD BIP STRL DISP 12FT (MISCELLANEOUS) ×3 IMPLANT
DRAPE MAG INST 16X20 L/F (DRAPES) ×3 IMPLANT
DRAPE SURG 17X11 SM STRL (DRAPES) ×6 IMPLANT
DRSG TEGADERM 2-3/8X2-3/4 SM (GAUZE/BANDAGES/DRESSINGS) ×9 IMPLANT
DRSG TEGADERM 4X4.75 (GAUZE/BANDAGES/DRESSINGS) ×3 IMPLANT
DRSG TELFA 4X3 1S NADH ST (GAUZE/BANDAGES/DRESSINGS) ×3 IMPLANT
ELECT EMG 20MM DUAL (MISCELLANEOUS) ×3
ELECT NEEDLE 20X.3 GREEN (MISCELLANEOUS) ×3
ELECT REM PT RETURN 9FT ADLT (ELECTROSURGICAL) ×3
ELECTRODE EMG 20MM DUAL (MISCELLANEOUS) ×1 IMPLANT
ELECTRODE NEEDLE 20X.3 GREEN (MISCELLANEOUS) ×1 IMPLANT
ELECTRODE REM PT RTRN 9FT ADLT (ELECTROSURGICAL) ×1 IMPLANT
FORCEPS JEWEL BIP 4-3/4 STR (INSTRUMENTS) ×3 IMPLANT
GAUZE SPONGE 4X4 12PLY STRL (GAUZE/BANDAGES/DRESSINGS) ×3 IMPLANT
GLOVE BIO SURGEON STRL SZ7.5 (GLOVE) ×6 IMPLANT
GOWN STRL REUS W/ TWL LRG LVL3 (GOWN DISPOSABLE) ×3 IMPLANT
GOWN STRL REUS W/TWL LRG LVL3 (GOWN DISPOSABLE) ×6
HOOK STAY BLUNT/RETRACTOR 5M (MISCELLANEOUS) ×3 IMPLANT
KIT RM TURNOVER STRD PROC AR (KITS) ×3 IMPLANT
LABEL OR SOLS (LABEL) ×3 IMPLANT
PACK HEAD/NECK (MISCELLANEOUS) ×3 IMPLANT
PROBE MONO 100X0.75 ELECT 1.9M (MISCELLANEOUS) ×3 IMPLANT
SHEARS HARMONIC 9CM CVD (BLADE) ×3 IMPLANT
SPONGE KITTNER 5P (MISCELLANEOUS) ×6 IMPLANT
SUT PROLENE 5 0 PS 3 (SUTURE) ×6 IMPLANT
SUT SILK 2 0 (SUTURE) ×4
SUT SILK 2-0 18XBRD TIE 12 (SUTURE) ×2 IMPLANT
SUT SILK 4 0 (SUTURE) ×4
SUT SILK 4-0 18XBRD TIE 12 (SUTURE) ×2 IMPLANT
SUT VIC AB 4-0 RB1 18 (SUTURE) ×3 IMPLANT

## 2017-01-07 NOTE — Transfer of Care (Signed)
Immediate Anesthesia Transfer of Care Note  Patient: Jason Gross  Procedure(s) Performed: Procedure(s): PAROTIDECTOMY (SUPERFICIAL) (Left)  Patient Location: PACU  Anesthesia Type:General  Level of Consciousness: responds to stimulation  Airway & Oxygen Therapy: Patient Spontanous Breathing and Patient connected to face mask oxygen  Post-op Assessment: Report given to RN and Post -op Vital signs reviewed and stable  Post vital signs: Reviewed and stable  Last Vitals:  Vitals:   01/07/17 0613 01/07/17 1019  BP: (!) 157/85 (!) 160/85  Pulse: 64 (!) 108  Resp: 16 (!) 24  Temp: 36.8 C 37.6 C    Last Pain:  Vitals:   01/07/17 1019  TempSrc: Temporal  PainSc: Asleep         Complications: No apparent anesthesia complications

## 2017-01-07 NOTE — Anesthesia Post-op Follow-up Note (Cosign Needed)
Anesthesia QCDR form completed.        

## 2017-01-07 NOTE — Progress Notes (Signed)
MD ordered patient to be discharged home.  Discharge instructions were reviewed with the patient and wife and they voiced understanding.  Patient instructed about following up with the MD office.  Prescription given to the patient.  IV was removed with catheter intact.  All patients questions were answered.  Patient refused to go out in a wheelchair so he walked out with his wife.

## 2017-01-07 NOTE — Op Note (Signed)
01/07/2017  10:06 AM    Greer Ee  [id number]   Pre-Op Diagnosis:  Left parotid mass  Post-op Diagnosis: Left parotid mass  Procedure: Left Superficial Parotidectomy  Surgeon:  Riley Nearing  Assistant: Margaretha Sheffield  Anesthesia:  General endotracheal anesthesia  EBL:  50 cc  Complications:  None  Findings: 3 cm left tail of parotid tumor extending up to the region of the stylomastoid foramen, superficial to the path of the facial nerve  Procedure: The patient was taken to the Operating Room and placed in the supine position.  After induction of general endotracheal anesthesia, the patient was turned 90 degrees and placed on a shoulder roll. The skin was injected along the proposed incision line over the left parotid region and neck with 1% lidocaine with epinephrine, 1:200,000. The facial nerve monitor electrodes were placed in the usual fashion at the brow and lower lip on the same side of the procedure. Proper functioning of the nerve monitor was assessed. The area was then prepped and draped in the usual sterile fashion.  A 15 blade was then used to incise the skin, starting in front of the tragus, and curving below the ear lobe into the neck in a gentle S shaped incision. The dissection was carried down to the subcutaneous tissues with the Bovie, exposing the sternocleidomastoid muscle in the neck. A cervicofacial skin flap was then elevated with scissors, superficial to the parotid fascia, and anteriorly just beyond anterior border of the mass. The flap was then retracted anteriorly with surgical stays. Dissection then proceeded along the tragal cartilage and more inferiorly, separating the parotid gland from the mastoid tip and the sternocleidomastoid muscle. Dissection anterior to the SCM identified the digastric muscle for anatomic reference. The greater auricular nerve required division for adequate exposure for the procedure. During dissection tissues were divided with  either the bipolar cautery or the harmonic scalpel, utilizing the latter instrument when dividing larger blood vessels for hemostasis. The tumor extended up into the stylomastoid foramen region, with very little margin between the tumor capsule and the mastoid. There was some rupture of tumor capsule in this region that was unavoidable during dissection. Dissection continued until wide exposure was obtained, which facilitated exposure and identification of the facial nerve at the stylomastoid foramen. This was confirmed using the nerve stimulator on a low setting. Careful dissection then proceeded along the trunk of the facial nerve, and subsequently along the branches of the nerve, dissecting the superficial portion of the parotid gland, with the associated mass, away from the branches of the facial nerve. The face was monitored for movement throughout the procedure, and tissues carefully divided with either the bipolar cautery or harmonic scalpel. Care was taken to identify all branches of the facial nerve and preserve any communicating branches. The dissection proceeded anteriorly until the dissection was beyond the anterior margin of the parotid mass. With the facial nerve completley separated from the specimen, the anterior margin of the specimen was divided with the harmonic scalpel.   The specimen was inspected and the mass found completely excised, and sent for pathology. The wound was irrigated with sterile water and hemostasis obtained. A #10 TLS drain was placed through a separate stab incision in the skin posterior to the incision in the neck, and secured with a 4-0 Vicryl suture. Deep closure was performed with 4-0 Vicryl suture to close the dead space from the resection as much as possible. The facial nerve was stimulated and found to stimulate  appropriately. The subcutaneous tissues were then closed with 4-0 Vicryl suture in an interrupted fashion. The skin was closed with 5-0 Prolene suture in a  running locked stitch. Bacitracin ointment was applied to the wound.  The patient was then returned to the anesthesiologist for awakening, and was taken to the Recovery Room in stable condition.  Cultures:  None.  Disposition:   PACU then to the floor for observation  Plan: To floor for observation with potential discharge home with drain in place.    Riley Nearing 01/07/2017 10:06 AM

## 2017-01-07 NOTE — H&P (Signed)
History and physical reviewed and will be scanned in later. No change in medical status reported by the patient or family, appears stable for surgery. All questions regarding the procedure answered, and patient (or family if a child) expressed understanding of the procedure.  Bradd Merlos S @TODAY@ 

## 2017-01-07 NOTE — Discharge Summary (Signed)
Thai, Burgueno [id number] November 04, 1955 Riley Nearing, MD   SUBJECTIVE: This 61 y.o. year old male is status post left PAROTIDECTOMY. Doing well, tolerated PO diet  Medications:  Current Facility-Administered Medications  Medication Dose Route Frequency Provider Last Rate Last Dose  . acetaminophen (TYLENOL) solution 650 mg  650 mg Oral Q4H PRN Clyde Canterbury, MD       Or  . acetaminophen (TYLENOL) suppository 650 mg  650 mg Rectal Q4H PRN Clyde Canterbury, MD      . bacitracin ointment 1 application  1 application Topical T5T Clyde Canterbury, MD   1 application at 73/22/02 1311  . dextrose 5 % and 0.45 % NaCl with KCl 20 mEq/L infusion   Intravenous Continuous Clyde Canterbury, MD 100 mL/hr at 01/07/17 1305    . docusate sodium (COLACE) capsule 100 mg  100 mg Oral BID Clyde Canterbury, MD      . HYDROcodone-acetaminophen (NORCO/VICODIN) 5-325 MG per tablet 1-2 tablet  1-2 tablet Oral Q4H PRN Clyde Canterbury, MD      . morphine 2 MG/ML injection 2-4 mg  2-4 mg Intravenous Q2H PRN Clyde Canterbury, MD      . ondansetron [facility]) tablet 4 mg  4 mg Oral Q4H PRN Clyde Canterbury, MD       Or  . ondansetron [facility]) injection 4 mg  4 mg Intravenous Q4H PRN Clyde Canterbury, MD      .  Medications Prior to Admission  Medication Sig Dispense Refill  . cetirizine (ZYRTEC) 10 MG tablet Take 10 mg by mouth daily.      Marland Kitchen ibuprofen (ADVIL,MOTRIN) 200 MG tablet Take 400 mg by mouth daily.    Marland Kitchen lisinopril-hydrochlorothiazide (PRINZIDE,ZESTORETIC) 10-12.5 MG tablet TAKE 1 TABLET DAILY 90 tablet 3  . diazepam (VALIUM) 5 MG tablet Take 0.5-1 tablets (2.5-5 mg total) by mouth 3 (three) times daily as needed (for vertigo). (Patient not taking: Reported on 01/01/2017) 30 tablet 1  . meclizine (ANTIVERT) 25 MG tablet Take 1-2 tablets (25-50 mg total) by mouth 3 (three) times daily as needed for dizziness. 90 tablet 1    OBJECTIVE:  PHYSICAL EXAM  Vitals: Blood pressure (!) 156/78, pulse (!) 108, temperature 98.4 F (36.9 C),  temperature source Oral, resp. rate 20, height 5\' 11"  (1.803 m), weight 220 lb (99.8 kg), SpO2 96 %.. General: Well-developed, Well-nourished in no acute distress Mood: Mood and affect well adjusted, pleasant and cooperative. Orientation: Grossly alert and oriented. Vocal Quality: No hoarseness. Communicates verbally. head and Face: NCAT. No facial asymmetry. No visible skin lesions. No significant facial scars. No tenderness with sinus percussion. Facial strength normal and symmetric. Neck: Neck wound clean/dry, sutures intact. No hematoma Respiratory: Normal respiratory effort without labored breathing.  MEDICAL DECISION MAKING: Data Review: No results found for this or any previous visit (from the past 48 hour(s)).Marland Kitchen No results found.. ASSESSMENT: Doing well s/p left superficial parotidectomy  PLAN: D/c home with drain in place. Remove tomorrow. Wound care discussed   Riley Nearing, MD 01/07/2017 5:20 PM

## 2017-01-07 NOTE — Anesthesia Procedure Notes (Signed)
Procedure Name: Intubation Date/Time: 01/07/2017 7:39 AM Performed by: Darlyne Russian Pre-anesthesia Checklist: Patient identified, Emergency Drugs available, Suction available, Patient being monitored and Timeout performed Patient Re-evaluated:Patient Re-evaluated prior to inductionOxygen Delivery Method: Circle system utilized Preoxygenation: Pre-oxygenation with 100% oxygen Intubation Type: IV induction Ventilation: Mask ventilation without difficulty Laryngoscope Size: Mac and 4 Grade View: Grade II Tube type: Oral Tube size: 7.5 mm Number of attempts: 1 Airway Equipment and Method: Stylet Placement Confirmation: ETT inserted through vocal cords under direct vision,  positive ETCO2 and breath sounds checked- equal and bilateral Secured at: 23 cm Tube secured with: Tape Dental Injury: Teeth and Oropharynx as per pre-operative assessment

## 2017-01-08 LAB — SURGICAL PATHOLOGY

## 2017-03-12 ENCOUNTER — Encounter: Payer: Self-pay | Admitting: Gastroenterology

## 2017-03-12 ENCOUNTER — Encounter: Payer: Self-pay | Admitting: Internal Medicine

## 2017-03-12 ENCOUNTER — Ambulatory Visit (INDEPENDENT_AMBULATORY_CARE_PROVIDER_SITE_OTHER): Payer: 59 | Admitting: Internal Medicine

## 2017-03-12 VITALS — BP 138/80 | HR 70 | Temp 97.6°F | Ht 69.75 in | Wt 217.0 lb

## 2017-03-12 DIAGNOSIS — Z125 Encounter for screening for malignant neoplasm of prostate: Secondary | ICD-10-CM | POA: Diagnosis not present

## 2017-03-12 DIAGNOSIS — Z23 Encounter for immunization: Secondary | ICD-10-CM

## 2017-03-12 DIAGNOSIS — Z0000 Encounter for general adult medical examination without abnormal findings: Secondary | ICD-10-CM | POA: Diagnosis not present

## 2017-03-12 DIAGNOSIS — Z1211 Encounter for screening for malignant neoplasm of colon: Secondary | ICD-10-CM | POA: Diagnosis not present

## 2017-03-12 DIAGNOSIS — I10 Essential (primary) hypertension: Secondary | ICD-10-CM | POA: Diagnosis not present

## 2017-03-12 DIAGNOSIS — J301 Allergic rhinitis due to pollen: Secondary | ICD-10-CM | POA: Diagnosis not present

## 2017-03-12 LAB — RENAL FUNCTION PANEL
ALBUMIN: 4.3 g/dL (ref 3.5–5.2)
BUN: 10 mg/dL (ref 6–23)
CHLORIDE: 100 meq/L (ref 96–112)
CO2: 31 mEq/L (ref 19–32)
Calcium: 9.5 mg/dL (ref 8.4–10.5)
Creatinine, Ser: 0.94 mg/dL (ref 0.40–1.50)
GFR: 87 mL/min (ref >60.00)
GLUCOSE: 101 mg/dL — AB (ref 70–99)
PHOSPHORUS: 3.5 mg/dL (ref 2.3–4.6)
Potassium: 4.2 mEq/L (ref 3.5–5.1)
SODIUM: 139 meq/L (ref 135–145)

## 2017-03-12 LAB — PSA: PSA: 0.75 ng/mL (ref 0.10–4.00)

## 2017-03-12 NOTE — Addendum Note (Signed)
Addended by: Pilar Grammes on: 03/12/2017 03:47 PM   Modules accepted: Orders

## 2017-03-12 NOTE — Assessment & Plan Note (Signed)
Okay with the OTC med

## 2017-03-12 NOTE — Assessment & Plan Note (Signed)
Healthy but not in good shape Discussed PSA--will check Will set up colon Tdap today

## 2017-03-12 NOTE — Patient Instructions (Signed)
DASH Eating Plan DASH stands for "Dietary Approaches to Stop Hypertension." The DASH eating plan is a healthy eating plan that has been shown to reduce high blood pressure (hypertension). It may also reduce your risk for type 2 diabetes, heart disease, and stroke. The DASH eating plan may also help with weight loss. What are tips for following this plan? General guidelines  Avoid eating more than 2,300 mg (milligrams) of salt (sodium) a day. If you have hypertension, you may need to reduce your sodium intake to 1,500 mg a day.  Limit alcohol intake to no more than 1 drink a day for nonpregnant women and 2 drinks a day for men. One drink equals 12 oz of beer, 5 oz of wine, or 1 oz of hard liquor.  Work with your health care provider to maintain a healthy body weight or to lose weight. Ask what an ideal weight is for you.  Get at least 30 minutes of exercise that causes your heart to beat faster (aerobic exercise) most days of the week. Activities may include walking, swimming, or biking.  Work with your health care provider or diet and nutrition specialist (dietitian) to adjust your eating plan to your individual calorie needs. Reading food labels  Check food labels for the amount of sodium per serving. Choose foods with less than 5 percent of the Daily Value of sodium. Generally, foods with less than 300 mg of sodium per serving fit into this eating plan.  To find whole grains, look for the word "whole" as the first word in the ingredient list. Shopping  Buy products labeled as "low-sodium" or "no salt added."  Buy fresh foods. Avoid canned foods and premade or frozen meals. Cooking  Avoid adding salt when cooking. Use salt-free seasonings or herbs instead of table salt or sea salt. Check with your health care provider or pharmacist before using salt substitutes.  Do not fry foods. Cook foods using healthy methods such as baking, boiling, grilling, and broiling instead.  Cook with  heart-healthy oils, such as olive, canola, soybean, or sunflower oil. Meal planning   Eat a balanced diet that includes: ? 5 or more servings of fruits and vegetables each day. At each meal, try to fill half of your plate with fruits and vegetables. ? Up to 6-8 servings of whole grains each day. ? Less than 6 oz of lean meat, poultry, or fish each day. A 3-oz serving of meat is about the same size as a deck of cards. One egg equals 1 oz. ? 2 servings of low-fat dairy each day. ? A serving of nuts, seeds, or beans 5 times each week. ? Heart-healthy fats. Healthy fats called Omega-3 fatty acids are found in foods such as flaxseeds and coldwater fish, like sardines, salmon, and mackerel.  Limit how much you eat of the following: ? Canned or prepackaged foods. ? Food that is high in trans fat, such as fried foods. ? Food that is high in saturated fat, such as fatty meat. ? Sweets, desserts, sugary drinks, and other foods with added sugar. ? Full-fat dairy products.  Do not salt foods before eating.  Try to eat at least 2 vegetarian meals each week.  Eat more home-cooked food and less restaurant, buffet, and fast food.  When eating at a restaurant, ask that your food be prepared with less salt or no salt, if possible. What foods are recommended? The items listed may not be a complete list. Talk with your dietitian about what   dietary choices are best for you. Grains Whole-grain or whole-wheat bread. Whole-grain or whole-wheat pasta. Brown rice. Oatmeal. Quinoa. Bulgur. Whole-grain and low-sodium cereals. Pita bread. Low-fat, low-sodium crackers. Whole-wheat flour tortillas. Vegetables Fresh or frozen vegetables (raw, steamed, roasted, or grilled). Low-sodium or reduced-sodium tomato and vegetable juice. Low-sodium or reduced-sodium tomato sauce and tomato paste. Low-sodium or reduced-sodium canned vegetables. Fruits All fresh, dried, or frozen fruit. Canned fruit in natural juice (without  added sugar). Meat and other protein foods Skinless chicken or turkey. Ground chicken or turkey. Pork with fat trimmed off. Fish and seafood. Egg whites. Dried beans, peas, or lentils. Unsalted nuts, nut butters, and seeds. Unsalted canned beans. Lean cuts of beef with fat trimmed off. Low-sodium, lean deli meat. Dairy Low-fat (1%) or fat-free (skim) milk. Fat-free, low-fat, or reduced-fat cheeses. Nonfat, low-sodium ricotta or cottage cheese. Low-fat or nonfat yogurt. Low-fat, low-sodium cheese. Fats and oils Soft margarine without trans fats. Vegetable oil. Low-fat, reduced-fat, or light mayonnaise and salad dressings (reduced-sodium). Canola, safflower, olive, soybean, and sunflower oils. Avocado. Seasoning and other foods Herbs. Spices. Seasoning mixes without salt. Unsalted popcorn and pretzels. Fat-free sweets. What foods are not recommended? The items listed may not be a complete list. Talk with your dietitian about what dietary choices are best for you. Grains Baked goods made with fat, such as croissants, muffins, or some breads. Dry pasta or rice meal packs. Vegetables Creamed or fried vegetables. Vegetables in a cheese sauce. Regular canned vegetables (not low-sodium or reduced-sodium). Regular canned tomato sauce and paste (not low-sodium or reduced-sodium). Regular tomato and vegetable juice (not low-sodium or reduced-sodium). Pickles. Olives. Fruits Canned fruit in a light or heavy syrup. Fried fruit. Fruit in cream or butter sauce. Meat and other protein foods Fatty cuts of meat. Ribs. Fried meat. Bacon. Sausage. Bologna and other processed lunch meats. Salami. Fatback. Hotdogs. Bratwurst. Salted nuts and seeds. Canned beans with added salt. Canned or smoked fish. Whole eggs or egg yolks. Chicken or turkey with skin. Dairy Whole or 2% milk, cream, and half-and-half. Whole or full-fat cream cheese. Whole-fat or sweetened yogurt. Full-fat cheese. Nondairy creamers. Whipped toppings.  Processed cheese and cheese spreads. Fats and oils Butter. Stick margarine. Lard. Shortening. Ghee. Bacon fat. Tropical oils, such as coconut, palm kernel, or palm oil. Seasoning and other foods Salted popcorn and pretzels. Onion salt, garlic salt, seasoned salt, table salt, and sea salt. Worcestershire sauce. Tartar sauce. Barbecue sauce. Teriyaki sauce. Soy sauce, including reduced-sodium. Steak sauce. Canned and packaged gravies. Fish sauce. Oyster sauce. Cocktail sauce. Horseradish that you find on the shelf. Ketchup. Mustard. Meat flavorings and tenderizers. Bouillon cubes. Hot sauce and Tabasco sauce. Premade or packaged marinades. Premade or packaged taco seasonings. Relishes. Regular salad dressings. Where to find more information:  National Heart, Lung, and Blood Institute: www.nhlbi.nih.gov  American Heart Association: www.heart.org Summary  The DASH eating plan is a healthy eating plan that has been shown to reduce high blood pressure (hypertension). It may also reduce your risk for type 2 diabetes, heart disease, and stroke.  With the DASH eating plan, you should limit salt (sodium) intake to 2,300 mg a day. If you have hypertension, you may need to reduce your sodium intake to 1,500 mg a day.  When on the DASH eating plan, aim to eat more fresh fruits and vegetables, whole grains, lean proteins, low-fat dairy, and heart-healthy fats.  Work with your health care provider or diet and nutrition specialist (dietitian) to adjust your eating plan to your individual   calorie needs. This information is not intended to replace advice given to you by your health care provider. Make sure you discuss any questions you have with your health care provider. Document Released: 07/03/2011 Document Revised: 07/07/2016 Document Reviewed: 07/07/2016 Elsevier Interactive Patient Education  2017 Elsevier Inc.  

## 2017-03-12 NOTE — Assessment & Plan Note (Signed)
BP Readings from Last 3 Encounters:  03/12/17 138/80  01/07/17 (!) 156/78  01/05/17 (!) 144/78   Good control now Due for recheck of potassium/renal function

## 2017-03-12 NOTE — Progress Notes (Signed)
Subjective:    Patient ID: Jason Gross, male    DOB: 1955-12-14, 61 y.o.   MRN: [id number]  HPI Here for physical  Did have the parotid excision Benign Sensation still coming back ---still some numbness  Back on BP med No problems with this  Will rarely use the hydrocodone Uses ibuprofen mostly when needed  Current Outpatient Prescriptions on File Prior to Visit  Medication Sig Dispense Refill  . cetirizine (ZYRTEC) 10 MG tablet Take 10 mg by mouth daily.      Marland Kitchen HYDROcodone-acetaminophen (NORCO/VICODIN) 5-325 MG tablet Take 1-2 tablets by mouth every 4 (four) hours as needed for moderate pain. 40 tablet 0  . lisinopril-hydrochlorothiazide (PRINZIDE,ZESTORETIC) 10-12.5 MG tablet TAKE 1 TABLET DAILY 90 tablet 3   No current facility-administered medications on file prior to visit.     No Known Allergies  Past Medical History:  Diagnosis Date  . Allergy   . GERD (gastroesophageal reflux disease)   . Hyperlipidemia   . Hypertension   . Vertigo     Past Surgical History:  Procedure Laterality Date  . PAROTIDECTOMY Left 01/07/2017   Procedure: PAROTIDECTOMY (SUPERFICIAL);  Surgeon: Clyde Canterbury, MD;  Location: [facility] ORS;  Service: ENT;  Laterality: Left;    Family History  Problem Relation Age of Onset  . Healthy Mother   . Coronary artery disease Father   . Diabetes Sister   . Diabetes Maternal Uncle   . Coronary artery disease Maternal Grandfather   . Diabetes Maternal Grandfather   . Hypertension Neg Hx   . Cancer Neg Hx     Social History   Social History  . Marital status: Married    Spouse name: N/A  . Number of children: 1  . Years of education: N/A   Occupational History  . Machinist @ [employer] History Main Topics  . Smoking status: Never Smoker  . Smokeless tobacco: Never Used  . Alcohol use No     Comment: occasional  . Drug use: No  . Sexual activity: Yes   Other Topics Concern  . Not on file   Social History Narrative  .  No narrative on file   Review of Systems  Constitutional: Negative for fatigue and unexpected weight change.       Stays active but no set exercise Wears seat belt  HENT: Negative for tinnitus and trouble swallowing.        Mild left ear hearing loss since surgery Has gotten some implants in past---keeps up with dentist (due to getting hit in mouth at work)  Eyes: Negative for visual disturbance.       No diplopia or unilateral vision loss  Respiratory: Negative for cough, chest tightness and shortness of breath.   Cardiovascular: Negative for chest pain, palpitations and leg swelling.  Gastrointestinal: Negative for abdominal pain, blood in stool, constipation and nausea.       No heartburn  Endocrine: Negative for polydipsia and polyuria.  Genitourinary: Negative for difficulty urinating and urgency.       No sexual problems  Musculoskeletal: Negative for arthralgias, back pain and joint swelling.  Skin: Negative for rash.       Some moles on back--no sig change  Allergic/Immunologic: Positive for environmental allergies. Negative for immunocompromised state.       Satisfied with sinus pill  Neurological: Negative for dizziness, syncope and light-headedness.       Only rare headache  Hematological: Negative for adenopathy. Does not bruise/bleed  easily.  Psychiatric/Behavioral: Negative for dysphoric mood and sleep disturbance. The patient is not nervous/anxious.        Objective:   Physical Exam  Constitutional: He is oriented to person, place, and time. He appears well-developed and well-nourished. No distress.  HENT:  Head: Normocephalic and atraumatic.  Right Ear: External ear normal.  Left Ear: External ear normal.  Mouth/Throat: Oropharynx is clear and moist. No oropharyngeal exudate.  Eyes: Pupils are equal, round, and reactive to light. Conjunctivae are normal.  Neck: Normal range of motion. No thyromegaly present.  Cardiovascular: Normal rate, regular rhythm, normal  heart sounds and intact distal pulses.  Exam reveals no gallop.   No murmur heard. Pulmonary/Chest: Effort normal and breath sounds normal. No respiratory distress. He has no wheezes. He has no rales.  Abdominal: Soft. There is no tenderness.  Musculoskeletal: He exhibits no edema or tenderness.  Lymphadenopathy:    He has no cervical adenopathy.  Neurological: He is alert and oriented to person, place, and time.  Skin: No rash noted. No erythema.  Multiple seb keratoses  Psychiatric: He has a normal mood and affect. His behavior is normal.          Assessment & Plan:

## 2017-05-08 ENCOUNTER — Ambulatory Visit (AMBULATORY_SURGERY_CENTER): Payer: Self-pay | Admitting: *Deleted

## 2017-05-08 ENCOUNTER — Encounter: Payer: Self-pay | Admitting: Gastroenterology

## 2017-05-08 VITALS — Ht 70.25 in | Wt 226.0 lb

## 2017-05-08 DIAGNOSIS — Z1211 Encounter for screening for malignant neoplasm of colon: Secondary | ICD-10-CM

## 2017-05-08 MED ORDER — NA SULFATE-K SULFATE-MG SULF 17.5-3.13-1.6 GM/177ML PO SOLN: 1.0000 [IU] | Freq: Once | ORAL | 0 refills | Status: AC

## 2017-05-08 NOTE — Progress Notes (Signed)
No egg or soy allergy known to patient  No issues with past sedation with any surgeries  or procedures, no intubation problems  No diet pills per patient No home 02 use per patient  No blood thinners per patient  Pt denies issues with constipation  No A fib or A flutter  EMMI video sent to pt's e mail pt declined   

## 2017-05-20 ENCOUNTER — Telehealth: Payer: Self-pay | Admitting: Gastroenterology

## 2017-05-20 ENCOUNTER — Encounter: Payer: Self-pay | Admitting: Gastroenterology

## 2017-05-22 ENCOUNTER — Encounter: Payer: 59 | Admitting: Gastroenterology

## 2017-06-05 ENCOUNTER — Encounter: Payer: Self-pay | Admitting: Gastroenterology

## 2017-06-05 ENCOUNTER — Other Ambulatory Visit: Payer: Self-pay

## 2017-06-05 ENCOUNTER — Ambulatory Visit (AMBULATORY_SURGERY_CENTER): Payer: 59 | Admitting: Gastroenterology

## 2017-06-05 VITALS — BP 141/81 | HR 55 | Temp 97.1°F | Resp 13 | Ht 70.25 in | Wt 226.0 lb

## 2017-06-05 DIAGNOSIS — Z1211 Encounter for screening for malignant neoplasm of colon: Secondary | ICD-10-CM | POA: Diagnosis present

## 2017-06-05 DIAGNOSIS — D120 Benign neoplasm of cecum: Secondary | ICD-10-CM

## 2017-06-05 DIAGNOSIS — D125 Benign neoplasm of sigmoid colon: Secondary | ICD-10-CM | POA: Diagnosis not present

## 2017-06-05 DIAGNOSIS — D123 Benign neoplasm of transverse colon: Secondary | ICD-10-CM | POA: Diagnosis not present

## 2017-06-05 MED ORDER — SODIUM CHLORIDE 0.9 % IV SOLN: 500.0000 mL | INTRAVENOUS | Status: DC

## 2017-06-05 NOTE — Progress Notes (Signed)
A and O x3. Report to RN. Tolerated MAC anesthesia well.

## 2017-06-05 NOTE — Progress Notes (Signed)
Called to room to assist during endoscopic procedure.  Patient ID and intended procedure confirmed with present staff. Received instructions for my participation in the procedure from the performing physician.  

## 2017-06-05 NOTE — Patient Instructions (Signed)
Discharge instructions given. Handouts on polyps and diverticulosis. Resume previous medications. No aspirin,ibuprofen,naproxen,or other non-steroidal anti-inflammatory drugs for 5 days. YOU HAD AN ENDOSCOPIC PROCEDURE TODAY AT [facility]:   Refer to the procedure report that was given to you for any specific questions about what was found during the examination.  If the procedure report does not answer your questions, please call your gastroenterologist to clarify.  If you requested that your care partner not be given the details of your procedure findings, then the procedure report has been included in a sealed envelope for you to review at your convenience later.  YOU SHOULD EXPECT: Some feelings of bloating in the abdomen. Passage of more gas than usual.  Walking can help get rid of the air that was put into your GI tract during the procedure and reduce the bloating. If you had a lower endoscopy (such as a colonoscopy or flexible sigmoidoscopy) you may notice spotting of blood in your stool or on the toilet paper. If you underwent a bowel prep for your procedure, you may not have a normal bowel movement for a few days.  Please Note:  You might notice some irritation and congestion in your nose or some drainage.  This is from the oxygen used during your procedure.  There is no need for concern and it should clear up in a day or so.  SYMPTOMS TO REPORT IMMEDIATELY:   Following lower endoscopy (colonoscopy or flexible sigmoidoscopy):  Excessive amounts of blood in the stool  Significant tenderness or worsening of abdominal pains  Swelling of the abdomen that is new, acute  Fever of 100F or higher   For urgent or emergent issues, a gastroenterologist can be reached at any hour by calling [phone].   DIET:  We do recommend a small meal at first, but then you may proceed to your regular diet.  Drink plenty of fluids but you should avoid alcoholic beverages for 24  hours.  ACTIVITY:  You should plan to take it easy for the rest of today and you should NOT DRIVE or use heavy machinery until tomorrow (because of the sedation medicines used during the test).    FOLLOW UP: Our staff will call the number listed on your records the next business day following your procedure to check on you and address any questions or concerns that you may have regarding the information given to you following your procedure. If we do not reach you, we will leave a message.  However, if you are feeling well and you are not experiencing any problems, there is no need to return our call.  We will assume that you have returned to your regular daily activities without incident.  If any biopsies were taken you will be contacted by phone or by letter within the next 1-3 weeks.  Please call us at [phone] if you have not heard about the biopsies in 3 weeks.    SIGNATURES/CONFIDENTIALITY: You and/or your care partner have signed paperwork which will be entered into your electronic medical record.  These signatures attest to the fact that that the information above on your After Visit Summary has been reviewed and is understood.  Full responsibility of the confidentiality of this discharge information lies with you and/or your care-partner.

## 2017-06-05 NOTE — Op Note (Signed)
[place] Patient Name: Jason Gross Procedure Date: 06/05/2017 7:48 AM MRN: [id number] Endoscopist: [place]. Loletha Carrow , MD Age: 61 Referring MD:  Date of Birth: September 04, 1955 Gender: Male Account #: [id number] Procedure:                Colonoscopy Indications:              Screening for colorectal malignant neoplasm, This                            is the patient's first colonoscopy Medicines:                Monitored Anesthesia Care Procedure:                Pre-Anesthesia Assessment:                           - Prior to the procedure, a History and Physical                            was performed, and patient medications and                            allergies were reviewed. The patient's tolerance of                            previous anesthesia was also reviewed. The risks                            and benefits of the procedure and the sedation                            options and risks were discussed with the patient.                            All questions were answered, and informed consent                            was obtained. Prior Anticoagulants: The patient has                            taken no previous anticoagulant or antiplatelet                            agents. ASA Grade Assessment: II - A patient with                            mild systemic disease. After reviewing the risks                            and benefits, the patient was deemed in                            satisfactory condition to undergo the procedure.  After obtaining informed consent, the colonoscope                            was passed under direct vision. Throughout the                            procedure, the patient's blood pressure, pulse, and                            oxygen saturations were monitored continuously. The                            Colonoscope was introduced through the anus and                            advanced to the the cecum,  identified by                            appendiceal orifice and ileocecal valve. The                            colonoscopy was performed without difficulty. The                            patient tolerated the procedure well. The quality                            of the bowel preparation was excellent. The                            ileocecal valve, appendiceal orifice, and rectum                            were photographed. The quality of the bowel                            preparation was evaluated using the BBPS [facility]                            Bowel Preparation Scale) with scores of: Right                            Colon = 3, Transverse Colon = 3 and Left Colon = 3                            (entire mucosa seen well with no residual staining,                            small fragments of stool or opaque liquid). The                            total BBPS score equals 9. The bowel preparation  used was SUPREP. Scope In: 8:04:11 AM Scope Out: 8:24:48 AM Scope Withdrawal Time: 0 hours 18 minutes 16 seconds  Total Procedure Duration: 0 hours 20 minutes 37 seconds  Findings:                 The perianal and digital rectal examinations were                            normal.                           A few diverticula were found in the left colon.                           A 2 mm polyp was found in the cecum. The polyp was                            sessile. The polyp was removed with a cold biopsy                            forceps. Resection and retrieval were complete.                           Two sessile polyps were found in the sigmoid colon                            and transverse colon. The polyps were 4 to 6 mm in                            size. These polyps were removed with a cold snare.                            Resection and retrieval were complete.                           One sessile and one pedunculated polyp were found                             in the distal sigmoid colon. The polyps were 8 mm                            in size. These polyps were removed with a hot                            snare. Resection and retrieval were complete.                           The exam was otherwise without abnormality on                            direct and retroflexion views. Complications:            No immediate complications. Estimated Blood Loss:     Estimated blood loss was minimal. Impression:               -  Diverticulosis in the left colon.                           - One 2 mm polyp in the cecum, removed with a cold                            biopsy forceps. Resected and retrieved.                           - Two 4 to 6 mm polyps in the sigmoid colon and in                            the transverse colon, removed with a cold snare.                            Resected and retrieved.                           - Two 8 mm polyps in the distal sigmoid colon,                            removed with a hot snare. Resected and retrieved.                           - The examination was otherwise normal on direct                            and retroflexion views. Recommendation:           - Patient has a contact number available for                            emergencies. The signs and symptoms of potential                            delayed complications were discussed with the                            patient. Return to normal activities tomorrow.                            Written discharge instructions were provided to the                            patient.                           - Resume previous diet.                           - No aspirin, ibuprofen, naproxen, or other                            non-steroidal anti-inflammatory drugs for 5 days  after polyp removal.                           - Await pathology results.                           - Repeat colonoscopy is recommended for                             surveillance. The colonoscopy date will be                            determined after pathology results from today's                            exam become available for review.  [name], MD 06/05/2017 8:32:41 AM This report has been signed electronically.

## 2017-06-05 NOTE — Progress Notes (Signed)
Pt's states no medical or surgical changes since previsit or office visit. 

## 2017-06-08 ENCOUNTER — Telehealth: Payer: Self-pay | Admitting: *Deleted

## 2017-06-08 ENCOUNTER — Telehealth: Payer: Self-pay

## 2017-06-08 NOTE — Telephone Encounter (Signed)
  Follow up Call-  Call back number 06/05/2017  Post procedure Call Back phone  # [phone]  Permission to leave phone message Yes  Some recent data might be hidden     Patient questions:  VM answered again. Second call.

## 2017-06-08 NOTE — Telephone Encounter (Signed)
Left message on answering machine. 

## 2017-06-11 ENCOUNTER — Encounter: Payer: Self-pay | Admitting: Gastroenterology

## 2017-06-30 ENCOUNTER — Emergency Department: Payer: No Typology Code available for payment source

## 2017-06-30 ENCOUNTER — Inpatient Hospital Stay: Payer: No Typology Code available for payment source

## 2017-06-30 ENCOUNTER — Encounter
Admission: EM | Disposition: A | Discharge status: Home / Self Care | Payer: Self-pay | Source: Home / Self Care | Attending: Internal Medicine

## 2017-06-30 ENCOUNTER — Other Ambulatory Visit: Payer: Self-pay

## 2017-06-30 ENCOUNTER — Inpatient Hospital Stay (HOSPITAL_COMMUNITY): Payer: No Typology Code available for payment source

## 2017-06-30 ENCOUNTER — Inpatient Hospital Stay
Admission: EM | Admit: 2017-06-30 | Discharge: 2017-07-07 | DRG: 280 | Disposition: A | Discharge status: Home / Self Care | Payer: No Typology Code available for payment source | Attending: Internal Medicine | Admitting: Internal Medicine

## 2017-06-30 ENCOUNTER — Encounter: Payer: Self-pay | Admitting: Emergency Medicine

## 2017-06-30 DIAGNOSIS — G9341 Metabolic encephalopathy: Secondary | ICD-10-CM | POA: Diagnosis present

## 2017-06-30 DIAGNOSIS — Z8249 Family history of ischemic heart disease and other diseases of the circulatory system: Secondary | ICD-10-CM

## 2017-06-30 DIAGNOSIS — G931 Anoxic brain damage, not elsewhere classified: Secondary | ICD-10-CM

## 2017-06-30 DIAGNOSIS — Y92009 Unspecified place in unspecified non-institutional (private) residence as the place of occurrence of the external cause: Secondary | ICD-10-CM | POA: Diagnosis not present

## 2017-06-30 DIAGNOSIS — I959 Hypotension, unspecified: Secondary | ICD-10-CM | POA: Diagnosis present

## 2017-06-30 DIAGNOSIS — E782 Mixed hyperlipidemia: Secondary | ICD-10-CM | POA: Diagnosis present

## 2017-06-30 DIAGNOSIS — J9601 Acute respiratory failure with hypoxia: Secondary | ICD-10-CM | POA: Diagnosis not present

## 2017-06-30 DIAGNOSIS — K219 Gastro-esophageal reflux disease without esophagitis: Secondary | ICD-10-CM | POA: Diagnosis present

## 2017-06-30 DIAGNOSIS — E876 Hypokalemia: Secondary | ICD-10-CM | POA: Diagnosis present

## 2017-06-30 DIAGNOSIS — I469 Cardiac arrest, cause unspecified: Secondary | ICD-10-CM | POA: Diagnosis present

## 2017-06-30 DIAGNOSIS — S2242XA Multiple fractures of ribs, left side, initial encounter for closed fracture: Secondary | ICD-10-CM | POA: Diagnosis present

## 2017-06-30 DIAGNOSIS — Z9049 Acquired absence of other specified parts of digestive tract: Secondary | ICD-10-CM

## 2017-06-30 DIAGNOSIS — Z23 Encounter for immunization: Secondary | ICD-10-CM | POA: Diagnosis not present

## 2017-06-30 DIAGNOSIS — Z79899 Other long term (current) drug therapy: Secondary | ICD-10-CM | POA: Diagnosis not present

## 2017-06-30 DIAGNOSIS — I214 Non-ST elevation (NSTEMI) myocardial infarction: Secondary | ICD-10-CM | POA: Diagnosis present

## 2017-06-30 DIAGNOSIS — I2510 Atherosclerotic heart disease of native coronary artery without angina pectoris: Secondary | ICD-10-CM | POA: Diagnosis present

## 2017-06-30 DIAGNOSIS — Z9181 History of falling: Secondary | ICD-10-CM | POA: Diagnosis not present

## 2017-06-30 DIAGNOSIS — N179 Acute kidney failure, unspecified: Secondary | ICD-10-CM | POA: Diagnosis present

## 2017-06-30 DIAGNOSIS — I4901 Ventricular fibrillation: Secondary | ICD-10-CM | POA: Diagnosis present

## 2017-06-30 DIAGNOSIS — R55 Syncope and collapse: Secondary | ICD-10-CM

## 2017-06-30 DIAGNOSIS — I462 Cardiac arrest due to underlying cardiac condition: Secondary | ICD-10-CM | POA: Diagnosis present

## 2017-06-30 DIAGNOSIS — Z452 Encounter for adjustment and management of vascular access device: Secondary | ICD-10-CM

## 2017-06-30 DIAGNOSIS — E872 Acidosis: Secondary | ICD-10-CM | POA: Diagnosis present

## 2017-06-30 DIAGNOSIS — J9690 Respiratory failure, unspecified, unspecified whether with hypoxia or hypercapnia: Secondary | ICD-10-CM

## 2017-06-30 DIAGNOSIS — D649 Anemia, unspecified: Secondary | ICD-10-CM | POA: Diagnosis not present

## 2017-06-30 DIAGNOSIS — I10 Essential (primary) hypertension: Secondary | ICD-10-CM | POA: Diagnosis present

## 2017-06-30 DIAGNOSIS — Z01818 Encounter for other preprocedural examination: Secondary | ICD-10-CM

## 2017-06-30 DIAGNOSIS — Z800 Family history of malignant neoplasm of digestive organs: Secondary | ICD-10-CM

## 2017-06-30 DIAGNOSIS — J690 Pneumonitis due to inhalation of food and vomit: Secondary | ICD-10-CM | POA: Diagnosis present

## 2017-06-30 DIAGNOSIS — R739 Hyperglycemia, unspecified: Secondary | ICD-10-CM | POA: Diagnosis present

## 2017-06-30 DIAGNOSIS — X58XXXA Exposure to other specified factors, initial encounter: Secondary | ICD-10-CM | POA: Diagnosis present

## 2017-06-30 DIAGNOSIS — J9600 Acute respiratory failure, unspecified whether with hypoxia or hypercapnia: Secondary | ICD-10-CM | POA: Diagnosis not present

## 2017-06-30 DIAGNOSIS — Z87891 Personal history of nicotine dependence: Secondary | ICD-10-CM | POA: Diagnosis not present

## 2017-06-30 DIAGNOSIS — Z978 Presence of other specified devices: Secondary | ICD-10-CM

## 2017-06-30 DIAGNOSIS — G9340 Encephalopathy, unspecified: Secondary | ICD-10-CM | POA: Diagnosis not present

## 2017-06-30 DIAGNOSIS — R4189 Other symptoms and signs involving cognitive functions and awareness: Secondary | ICD-10-CM

## 2017-06-30 HISTORY — DX: Benign neoplasm of parotid gland: D11.0

## 2017-06-30 HISTORY — DX: Other diseases of salivary glands: K11.8

## 2017-06-30 HISTORY — PX: LEFT HEART CATH AND CORONARY ANGIOGRAPHY: CATH118249

## 2017-06-30 LAB — PROCALCITONIN: Procalcitonin: 0.6 ng/mL

## 2017-06-30 LAB — GLUCOSE, CAPILLARY
Glucose-Capillary: 102 mg/dL — ABNORMAL HIGH (ref 65–99)
Glucose-Capillary: 105 mg/dL — ABNORMAL HIGH (ref 65–99)
Glucose-Capillary: 108 mg/dL — ABNORMAL HIGH (ref 65–99)
Glucose-Capillary: 140 mg/dL — ABNORMAL HIGH (ref 65–99)
Glucose-Capillary: 145 mg/dL — ABNORMAL HIGH (ref 65–99)
Glucose-Capillary: 186 mg/dL — ABNORMAL HIGH (ref 65–99)

## 2017-06-30 LAB — COMPREHENSIVE METABOLIC PANEL
ALBUMIN: 3.7 g/dL (ref 3.5–5.0)
ALK PHOS: 51 U/L (ref 38–126)
ALK PHOS: 36 U/L — AB (ref 38–126)
ALT: 68 U/L — AB (ref 17–63)
ALT: 66 U/L — AB (ref 17–63)
AST: 74 U/L — AB (ref 15–41)
AST: 69 U/L — AB (ref 15–41)
Albumin: 3.2 g/dL — ABNORMAL LOW (ref 3.5–5.0)
Anion gap: 18 — ABNORMAL HIGH (ref 5–15)
Anion gap: 5 (ref 5–15)
BILIRUBIN TOTAL: 1 mg/dL (ref 0.3–1.2)
BUN: 11 mg/dL (ref 6–20)
BUN: 14 mg/dL (ref 6–20)
CALCIUM: 8.6 mg/dL — AB (ref 8.9–10.3)
CALCIUM: 7.3 mg/dL — AB (ref 8.9–10.3)
CO2: 19 mmol/L — AB (ref 22–32)
CO2: 23 mmol/L (ref 22–32)
CREATININE: 0.99 mg/dL (ref 0.61–1.24)
Chloride: 100 mmol/L — ABNORMAL LOW (ref 101–111)
Chloride: 108 mmol/L (ref 101–111)
Creatinine, Ser: 1.22 mg/dL (ref 0.61–1.24)
GFR calc Af Amer: >60 mL/min (ref >60)
GFR calc Af Amer: >60 mL/min (ref >60)
GFR calc non Af Amer: >60 mL/min (ref >60)
GLUCOSE: 245 mg/dL — AB (ref 65–99)
GLUCOSE: 171 mg/dL — AB (ref 65–99)
Potassium: 2.9 mmol/L — ABNORMAL LOW (ref 3.5–5.1)
Potassium: 4.3 mmol/L (ref 3.5–5.1)
Sodium: 137 mmol/L (ref 135–145)
Sodium: 136 mmol/L (ref 135–145)
TOTAL PROTEIN: 6.7 g/dL (ref 6.5–8.1)
TOTAL PROTEIN: 5.8 g/dL — AB (ref 6.5–8.1)
Total Bilirubin: 0.7 mg/dL (ref 0.3–1.2)

## 2017-06-30 LAB — BLOOD GAS, ARTERIAL
Acid-base deficit: 6.4 mmol/L — ABNORMAL HIGH (ref 0.0–2.0)
Acid-base deficit: 5 mmol/L — ABNORMAL HIGH (ref 0.0–2.0)
BICARBONATE: 20.2 mmol/L (ref 20.0–28.0)
Bicarbonate: 23.1 mmol/L (ref 20.0–28.0)
FIO2: 1
FIO2: 1
LHR: 14 {breaths}/min
MECHANICAL RATE: 14
MECHANICAL RATE: 14
MECHVT: 500 mL
MECHVT: 500 mL
O2 SAT: 96 %
O2 SAT: 99.8 %
PATIENT TEMPERATURE: 37
PATIENT TEMPERATURE: 36
PCO2 ART: 43 mmHg (ref 32.0–48.0)
PCO2 ART: 54 mmHg — AB (ref 32.0–48.0)
PEEP/CPAP: 5 cmH2O
PEEP: 5 cmH2O
PO2 ART: 92 mmHg (ref 83.0–108.0)
PO2 ART: 233 mmHg — AB (ref 83.0–108.0)
RATE: 14 resp/min
pH, Arterial: 7.28 — ABNORMAL LOW (ref 7.350–7.450)
pH, Arterial: 7.25 — ABNORMAL LOW (ref 7.350–7.450)

## 2017-06-30 LAB — URINALYSIS, COMPLETE (UACMP) WITH MICROSCOPIC
BACTERIA UA: NONE SEEN
BILIRUBIN URINE: NEGATIVE
Glucose, UA: >=500 mg/dL — AB
Ketones, ur: NEGATIVE mg/dL
LEUKOCYTES UA: NEGATIVE
Nitrite: NEGATIVE
PH: 7 (ref 5.0–8.0)
Protein, ur: 100 mg/dL — AB
SPECIFIC GRAVITY, URINE: 1.006 (ref 1.005–1.030)
Squamous Epithelial / LPF: NONE SEEN

## 2017-06-30 LAB — CBC
HCT: 39.5 % — ABNORMAL LOW (ref 40.0–52.0)
HEMATOCRIT: 44.2 % (ref 40.0–52.0)
HEMOGLOBIN: 15 g/dL (ref 13.0–18.0)
HEMOGLOBIN: 13.7 g/dL (ref 13.0–18.0)
MCH: 29 pg (ref 26.0–34.0)
MCH: 29.5 pg (ref 26.0–34.0)
MCHC: 33.9 g/dL (ref 32.0–36.0)
MCHC: 34.8 g/dL (ref 32.0–36.0)
MCV: 85.5 fL (ref 80.0–100.0)
MCV: 84.9 fL (ref 80.0–100.0)
Platelets: 282 10*3/uL (ref 150–440)
Platelets: 241 10*3/uL (ref 150–440)
RBC: 5.17 MIL/uL (ref 4.40–5.90)
RBC: 4.65 MIL/uL (ref 4.40–5.90)
RDW: 13.7 % (ref 11.5–14.5)
RDW: 13.7 % (ref 11.5–14.5)
WBC: 17.6 10*3/uL — AB (ref 3.8–10.6)
WBC: 17.7 10*3/uL — AB (ref 3.8–10.6)

## 2017-06-30 LAB — TROPONIN I
TROPONIN I: 4.92 ng/mL — AB (ref <0.03)
TROPONIN I: 4.19 ng/mL — AB (ref <0.03)
TROPONIN I: 3.16 ng/mL — AB (ref <0.03)
TROPONIN I: 2.18 ng/mL — AB (ref <0.03)
Troponin I: 0.09 ng/mL (ref <0.03)

## 2017-06-30 LAB — BASIC METABOLIC PANEL
Anion gap: 7 (ref 5–15)
BUN: 14 mg/dL (ref 6–20)
CALCIUM: 7 mg/dL — AB (ref 8.9–10.3)
CO2: 23 mmol/L (ref 22–32)
CREATININE: 0.78 mg/dL (ref 0.61–1.24)
Chloride: 107 mmol/L (ref 101–111)
Glucose, Bld: 137 mg/dL — ABNORMAL HIGH (ref 65–99)
Potassium: 5.1 mmol/L (ref 3.5–5.1)
SODIUM: 137 mmol/L (ref 135–145)

## 2017-06-30 LAB — CREATININE, SERUM
CREATININE: 1.04 mg/dL (ref 0.61–1.24)
GFR calc Af Amer: >60 mL/min (ref >60)

## 2017-06-30 LAB — PROTIME-INR
INR: 1.13
Prothrombin Time: 14.4 seconds (ref 11.4–15.2)

## 2017-06-30 LAB — MRSA PCR SCREENING: MRSA BY PCR: NEGATIVE

## 2017-06-30 LAB — LIPID PANEL
Cholesterol: 153 mg/dL (ref 0–200)
HDL: 28 mg/dL — AB (ref >40)
LDL CALC: 91 mg/dL (ref 0–99)
TRIGLYCERIDES: 170 mg/dL — AB (ref <150)
Total CHOL/HDL Ratio: 5.5 RATIO
VLDL: 34 mg/dL (ref 0–40)

## 2017-06-30 LAB — TSH: TSH: 2.673 u[IU]/mL (ref 0.350–4.500)

## 2017-06-30 LAB — URINE DRUG SCREEN, QUALITATIVE (ARMC ONLY)
Amphetamines, Ur Screen: NOT DETECTED
BARBITURATES, UR SCREEN: NOT DETECTED
BENZODIAZEPINE, UR SCRN: POSITIVE — AB
CANNABINOID 50 NG, UR ~~LOC~~: NOT DETECTED
COCAINE METABOLITE, UR ~~LOC~~: NOT DETECTED
MDMA (Ecstasy)Ur Screen: NOT DETECTED
METHADONE SCREEN, URINE: NOT DETECTED
OPIATE, UR SCREEN: NOT DETECTED
Phencyclidine (PCP) Ur S: NOT DETECTED
Tricyclic, Ur Screen: NOT DETECTED

## 2017-06-30 LAB — LACTIC ACID, PLASMA
Lactic Acid, Venous: 5 mmol/L (ref 0.5–1.9)
Lactic Acid, Venous: 2 mmol/L (ref 0.5–1.9)

## 2017-06-30 LAB — APTT: aPTT: 27 seconds (ref 24–36)

## 2017-06-30 LAB — PHOSPHORUS: Phosphorus: 3.3 mg/dL (ref 2.5–4.6)

## 2017-06-30 LAB — MAGNESIUM: MAGNESIUM: 1.6 mg/dL — AB (ref 1.7–2.4)

## 2017-06-30 LAB — HEMOGLOBIN A1C
Hgb A1c MFr Bld: 5.6 % (ref 4.8–5.6)
Mean Plasma Glucose: 114.02 mg/dL

## 2017-06-30 LAB — HEPARIN LEVEL (UNFRACTIONATED): Heparin Unfractionated: 0.29 IU/mL — ABNORMAL LOW (ref 0.30–0.70)

## 2017-06-30 LAB — TRIGLYCERIDES: Triglycerides: 168 mg/dL — ABNORMAL HIGH (ref <150)

## 2017-06-30 SURGERY — LEFT HEART CATH AND CORONARY ANGIOGRAPHY
Anesthesia: Moderate Sedation

## 2017-06-30 MED ORDER — HEPARIN SODIUM (PORCINE) 1000 UNIT/ML IJ SOLN
INTRAMUSCULAR | Status: DC | PRN
  Administered 2017-06-30: 5000 [IU] via INTRAVENOUS

## 2017-06-30 MED ORDER — ARTIFICIAL TEARS OPHTHALMIC OINT
1.0000 "application " | TOPICAL_OINTMENT | Freq: Three times a day (TID) | OPHTHALMIC | Status: DC
  Administered 2017-06-30 – 2017-07-01 (×4): 1 via OPHTHALMIC
  Filled 2017-06-30: qty 3.5

## 2017-06-30 MED ORDER — FENTANYL CITRATE (PF) 100 MCG/2ML IJ SOLN
INTRAMUSCULAR | Status: AC
  Filled 2017-06-30: qty 2

## 2017-06-30 MED ORDER — POTASSIUM CHLORIDE 10 MEQ/100ML IV SOLN
10.0000 meq | INTRAVENOUS | Status: AC
  Administered 2017-06-30 (×2): 10 meq via INTRAVENOUS
  Filled 2017-06-30 (×4): qty 100

## 2017-06-30 MED ORDER — SODIUM CHLORIDE 0.9 % IV BOLUS (SEPSIS)
1000.0000 mL | INTRAVENOUS | Status: AC
  Administered 2017-06-30 (×2): 1000 mL via INTRAVENOUS

## 2017-06-30 MED ORDER — FENTANYL CITRATE (PF) 100 MCG/2ML IJ SOLN
50.0000 ug | Freq: Once | INTRAMUSCULAR | Status: AC
  Administered 2017-06-30: 50 ug via INTRAVENOUS

## 2017-06-30 MED ORDER — PANTOPRAZOLE SODIUM 40 MG IV SOLR
40.0000 mg | Freq: Every day | INTRAVENOUS | Status: DC
  Administered 2017-06-30 – 2017-07-02 (×3): 40 mg via INTRAVENOUS
  Filled 2017-06-30 (×3): qty 40

## 2017-06-30 MED ORDER — INFLUENZA VAC SPLIT QUAD 0.5 ML IM SUSY: 0.5000 mL | PREFILLED_SYRINGE | INTRAMUSCULAR | Status: DC

## 2017-06-30 MED ORDER — IPRATROPIUM-ALBUTEROL 0.5-2.5 (3) MG/3ML IN SOLN
3.0000 mL | Freq: Four times a day (QID) | RESPIRATORY_TRACT | Status: DC
  Filled 2017-06-30: qty 3

## 2017-06-30 MED ORDER — ENOXAPARIN SODIUM 40 MG/0.4ML ~~LOC~~ SOLN: 40.0000 mg | SUBCUTANEOUS | Status: DC

## 2017-06-30 MED ORDER — PROPOFOL 1000 MG/100ML IV EMUL
25.0000 ug/kg/min | INTRAVENOUS | Status: DC
  Administered 2017-06-30 – 2017-07-01 (×4): 40 ug/kg/min via INTRAVENOUS
  Filled 2017-06-30: qty 100

## 2017-06-30 MED ORDER — MIDAZOLAM HCL 2 MG/2ML IJ SOLN
2.0000 mg | Freq: Once | INTRAMUSCULAR | Status: AC
Start: 2017-06-30 — End: 2017-06-30
  Administered 2017-06-30: 2 mg via INTRAVENOUS

## 2017-06-30 MED ORDER — MIDAZOLAM HCL 5 MG/5ML IJ SOLN
INTRAMUSCULAR | Status: AC
  Administered 2017-06-30: 2 mg via INTRAVENOUS
  Filled 2017-06-30: qty 5

## 2017-06-30 MED ORDER — ACETAMINOPHEN 650 MG RE SUPP
650.0000 mg | Freq: Four times a day (QID) | RECTAL | Status: DC | PRN
Start: 2017-06-30 — End: 2017-07-04

## 2017-06-30 MED ORDER — SENNOSIDES 8.8 MG/5ML PO SYRP
5.0000 mL | ORAL_SOLUTION | Freq: Two times a day (BID) | ORAL | Status: DC | PRN
  Filled 2017-06-30: qty 5

## 2017-06-30 MED ORDER — BISACODYL 10 MG RE SUPP: 10.0000 mg | Freq: Every day | RECTAL | Status: DC | PRN

## 2017-06-30 MED ORDER — CHLORHEXIDINE GLUCONATE 0.12% ORAL RINSE (MEDLINE KIT)
15.0000 mL | Freq: Two times a day (BID) | OROMUCOSAL | Status: DC
  Administered 2017-06-30 – 2017-07-01 (×3): 15 mL via OROMUCOSAL

## 2017-06-30 MED ORDER — SODIUM CHLORIDE 0.9 % IV SOLN
3.0000 ug/kg/min | INTRAVENOUS | Status: DC
  Administered 2017-06-30 (×2): 3 ug/kg/min via INTRAVENOUS
  Filled 2017-06-30 (×3): qty 20

## 2017-06-30 MED ORDER — SODIUM CHLORIDE 0.9% FLUSH
3.0000 mL | Freq: Two times a day (BID) | INTRAVENOUS | Status: DC
  Administered 2017-06-30 – 2017-07-02 (×4): 3 mL via INTRAVENOUS

## 2017-06-30 MED ORDER — SODIUM CHLORIDE 0.9 % IV SOLN
0.5000 mg/h | INTRAVENOUS | Status: DC
  Administered 2017-06-30: 0.5 mg/h via INTRAVENOUS
  Filled 2017-06-30: qty 10

## 2017-06-30 MED ORDER — ONDANSETRON HCL 4 MG/2ML IJ SOLN
4.0000 mg | Freq: Four times a day (QID) | INTRAMUSCULAR | Status: DC | PRN
  Administered 2017-07-03 (×3): 4 mg via INTRAVENOUS
  Filled 2017-06-30 (×5): qty 2

## 2017-06-30 MED ORDER — PROPOFOL 1000 MG/100ML IV EMUL
0.5000 mg/kg | Freq: Once | INTRAVENOUS | Status: AC
  Administered 2017-06-30: 04:00:00 via INTRAVENOUS

## 2017-06-30 MED ORDER — LIDOCAINE HCL (PF) 1 % IJ SOLN
INTRAMUSCULAR | Status: AC
  Filled 2017-06-30: qty 30

## 2017-06-30 MED ORDER — FENTANYL BOLUS VIA INFUSION
50.0000 ug | INTRAVENOUS | Status: DC | PRN
  Administered 2017-07-01: 100 ug via INTRAVENOUS
  Filled 2017-06-30: qty 100

## 2017-06-30 MED ORDER — ACETAMINOPHEN 650 MG RE SUPP: 650.0000 mg | Freq: Four times a day (QID) | RECTAL | Status: DC | PRN

## 2017-06-30 MED ORDER — INSULIN ASPART 100 UNIT/ML ~~LOC~~ SOLN
0.0000 [IU] | SUBCUTANEOUS | Status: DC
  Administered 2017-06-30: 3 [IU] via SUBCUTANEOUS
  Filled 2017-06-30: qty 1

## 2017-06-30 MED ORDER — ONDANSETRON HCL 4 MG PO TABS: 4.0000 mg | ORAL_TABLET | Freq: Four times a day (QID) | ORAL | Status: DC | PRN

## 2017-06-30 MED ORDER — MIDAZOLAM HCL 2 MG/2ML IJ SOLN
2.0000 mg | Freq: Once | INTRAMUSCULAR | Status: AC
  Administered 2017-06-30: 2 mg via INTRAVENOUS

## 2017-06-30 MED ORDER — ROCURONIUM BROMIDE 50 MG/5ML IV SOLN
INTRAVENOUS | Status: AC | PRN
  Administered 2017-06-30: 100 mg via INTRAVENOUS

## 2017-06-30 MED ORDER — VERAPAMIL HCL 2.5 MG/ML IV SOLN
INTRAVENOUS | Status: AC
  Filled 2017-06-30: qty 2

## 2017-06-30 MED ORDER — IOPAMIDOL (ISOVUE-300) INJECTION 61%
INTRAVENOUS | Status: DC | PRN
  Administered 2017-06-30: 30 mL via INTRA_ARTERIAL

## 2017-06-30 MED ORDER — IOPAMIDOL (ISOVUE-370) INJECTION 76%
100.0000 mL | Freq: Once | INTRAVENOUS | Status: AC | PRN
  Administered 2017-06-30: 100 mL via INTRAVENOUS

## 2017-06-30 MED ORDER — ETOMIDATE 2 MG/ML IV SOLN
INTRAVENOUS | Status: AC | PRN
  Administered 2017-06-30: 20 mg via INTRAVENOUS

## 2017-06-30 MED ORDER — POTASSIUM CHLORIDE 10 MEQ/100ML IV SOLN
10.0000 meq | INTRAVENOUS | Status: DC
  Administered 2017-06-30 (×2): 10 meq via INTRAVENOUS
  Filled 2017-06-30 (×2): qty 100

## 2017-06-30 MED ORDER — FENTANYL 2500MCG IN NS 250ML (10MCG/ML) PREMIX INFUSION
100.0000 ug/h | INTRAVENOUS | Status: DC
  Administered 2017-06-30 – 2017-07-01 (×2): 250 ug/h via INTRAVENOUS

## 2017-06-30 MED ORDER — SODIUM CHLORIDE 0.9 % IV SOLN: 250.0000 mL | INTRAVENOUS | Status: DC | PRN

## 2017-06-30 MED ORDER — LACTATED RINGERS IV SOLN
INTRAVENOUS | Status: DC
  Administered 2017-06-30 – 2017-07-01 (×2): via INTRAVENOUS

## 2017-06-30 MED ORDER — ASPIRIN 325 MG PO TABS
325.0000 mg | ORAL_TABLET | Freq: Every day | ORAL | Status: DC
  Administered 2017-06-30: 325 mg
  Filled 2017-06-30: qty 1

## 2017-06-30 MED ORDER — PROPOFOL 1000 MG/100ML IV EMUL
0.0000 ug/kg/min | INTRAVENOUS | Status: DC
  Administered 2017-06-30 – 2017-07-01 (×4): 40 ug/kg/min via INTRAVENOUS
  Filled 2017-06-30 (×7): qty 100

## 2017-06-30 MED ORDER — PIPERACILLIN-TAZOBACTAM 3.375 G IVPB
3.3750 g | Freq: Three times a day (TID) | INTRAVENOUS | Status: DC
  Administered 2017-06-30 – 2017-07-05 (×15): 3.375 g via INTRAVENOUS
  Filled 2017-06-30 (×15): qty 50

## 2017-06-30 MED ORDER — SODIUM CHLORIDE 0.9% FLUSH: 3.0000 mL | INTRAVENOUS | Status: DC | PRN

## 2017-06-30 MED ORDER — ASPIRIN 81 MG PO CHEW: 81.0000 mg | CHEWABLE_TABLET | ORAL | Status: AC

## 2017-06-30 MED ORDER — HEPARIN SODIUM (PORCINE) 1000 UNIT/ML IJ SOLN
INTRAMUSCULAR | Status: AC
  Filled 2017-06-30: qty 1

## 2017-06-30 MED ORDER — NOREPINEPHRINE BITARTRATE 1 MG/ML IV SOLN
0.0000 ug/min | INTRAVENOUS | Status: DC
  Administered 2017-06-30: 50 ug/min via INTRAVENOUS
  Filled 2017-06-30: qty 16

## 2017-06-30 MED ORDER — ATORVASTATIN CALCIUM 20 MG PO TABS
40.0000 mg | ORAL_TABLET | Freq: Every day | ORAL | Status: DC
  Administered 2017-06-30 – 2017-07-02 (×3): 40 mg
  Filled 2017-06-30 (×3): qty 2

## 2017-06-30 MED ORDER — INSULIN ASPART 100 UNIT/ML ~~LOC~~ SOLN
0.0000 [IU] | SUBCUTANEOUS | Status: DC
  Administered 2017-06-30: 2 [IU] via SUBCUTANEOUS
  Administered 2017-07-01 – 2017-07-04 (×4): 1 [IU] via SUBCUTANEOUS
  Filled 2017-06-30 (×5): qty 1

## 2017-06-30 MED ORDER — SODIUM CHLORIDE 0.9 % WEIGHT BASED INFUSION: 3.0000 mL/kg/h | INTRAVENOUS | Status: DC

## 2017-06-30 MED ORDER — IPRATROPIUM-ALBUTEROL 0.5-2.5 (3) MG/3ML IN SOLN
3.0000 mL | Freq: Four times a day (QID) | RESPIRATORY_TRACT | Status: DC
  Administered 2017-06-30 – 2017-07-04 (×18): 3 mL via RESPIRATORY_TRACT
  Filled 2017-06-30 (×17): qty 3

## 2017-06-30 MED ORDER — SODIUM CHLORIDE 0.9 % IV SOLN
50.0000 ug/h | INTRAVENOUS | Status: DC
  Administered 2017-06-30: 50 ug/h via INTRAVENOUS
  Filled 2017-06-30: qty 50

## 2017-06-30 MED ORDER — ACETAMINOPHEN 325 MG PO TABS: 650.0000 mg | ORAL_TABLET | Freq: Four times a day (QID) | ORAL | Status: DC | PRN

## 2017-06-30 MED ORDER — ASPIRIN EC 81 MG PO TBEC
81.0000 mg | DELAYED_RELEASE_TABLET | Freq: Every day | ORAL | Status: DC
  Administered 2017-07-01 – 2017-07-02 (×2): 81 mg via ORAL
  Filled 2017-06-30 (×3): qty 1

## 2017-06-30 MED ORDER — PROPOFOL 1000 MG/100ML IV EMUL
INTRAVENOUS | Status: AC
  Filled 2017-06-30: qty 100

## 2017-06-30 MED ORDER — ACETAMINOPHEN 325 MG PO TABS
650.0000 mg | ORAL_TABLET | Freq: Four times a day (QID) | ORAL | Status: DC | PRN
  Administered 2017-07-02: 650 mg
  Filled 2017-06-30 (×2): qty 2

## 2017-06-30 MED ORDER — SODIUM CHLORIDE 0.9 % WEIGHT BASED INFUSION: 1.0000 mL/kg/h | INTRAVENOUS | Status: DC

## 2017-06-30 MED ORDER — ORAL CARE MOUTH RINSE
15.0000 mL | Freq: Four times a day (QID) | OROMUCOSAL | Status: DC
  Administered 2017-06-30 – 2017-07-01 (×5): 15 mL via OROMUCOSAL

## 2017-06-30 MED ORDER — HEPARIN (PORCINE) IN NACL 100-0.45 UNIT/ML-% IJ SOLN
1100.0000 [IU]/h | INTRAMUSCULAR | Status: DC
  Filled 2017-06-30: qty 250

## 2017-06-30 MED ORDER — MAGNESIUM SULFATE 4 GM/100ML IV SOLN
4.0000 g | Freq: Once | INTRAVENOUS | Status: AC
  Administered 2017-06-30: 4 g via INTRAVENOUS
  Filled 2017-06-30: qty 100

## 2017-06-30 MED ORDER — SODIUM CHLORIDE 0.9% FLUSH
3.0000 mL | INTRAVENOUS | Status: DC | PRN
  Administered 2017-07-01: 3 mL via INTRAVENOUS
  Filled 2017-06-30: qty 3

## 2017-06-30 MED ORDER — SODIUM CHLORIDE 0.9 % IV SOLN: INTRAVENOUS | Status: DC

## 2017-06-30 MED ORDER — FENTANYL CITRATE (PF) 100 MCG/2ML IJ SOLN
50.0000 ug | Freq: Once | INTRAMUSCULAR | Status: AC
Start: 2017-06-30 — End: 2017-06-30
  Administered 2017-06-30: 50 ug via INTRAVENOUS

## 2017-06-30 MED ORDER — SODIUM CHLORIDE 0.9% FLUSH
3.0000 mL | Freq: Two times a day (BID) | INTRAVENOUS | Status: DC
  Administered 2017-06-30 – 2017-07-02 (×3): 3 mL via INTRAVENOUS

## 2017-06-30 MED ORDER — HEPARIN BOLUS VIA INFUSION
4000.0000 [IU] | Freq: Once | INTRAVENOUS | Status: AC
  Administered 2017-06-30: 4000 [IU] via INTRAVENOUS
  Filled 2017-06-30: qty 4000

## 2017-06-30 MED ORDER — FENTANYL 2500MCG IN NS 250ML (10MCG/ML) PREMIX INFUSION
0.0000 ug/h | INTRAVENOUS | Status: DC
  Administered 2017-06-30: 200 ug/h via INTRAVENOUS
  Administered 2017-06-30: 250 ug/h via INTRAVENOUS
  Filled 2017-06-30 (×3): qty 250

## 2017-06-30 MED ORDER — HEPARIN (PORCINE) IN NACL 2-0.9 UNIT/ML-% IJ SOLN
INTRAMUSCULAR | Status: AC
  Filled 2017-06-30: qty 500

## 2017-06-30 MED ORDER — HEPARIN (PORCINE) IN NACL 100-0.45 UNIT/ML-% IJ SOLN
1700.0000 [IU]/h | INTRAMUSCULAR | Status: DC
  Administered 2017-06-30: 1100 [IU]/h via INTRAVENOUS
  Administered 2017-07-01: 1250 [IU]/h via INTRAVENOUS
  Administered 2017-07-03 – 2017-07-04 (×3): 1650 [IU]/h via INTRAVENOUS
  Filled 2017-06-30 (×6): qty 250

## 2017-06-30 SURGICAL SUPPLY — 7 items
CATH INFINITI 5FR JL4 (CATHETERS) ×3 IMPLANT
CATH INFINITI JR4 5F (CATHETERS) ×3 IMPLANT
DEVICE RAD TR BAND REGULAR (VASCULAR PRODUCTS) ×3 IMPLANT
GLIDESHEATH SLEND SS 6F .021 (SHEATH) ×3 IMPLANT
KIT MANI 3VAL PERCEP (MISCELLANEOUS) ×3 IMPLANT
PACK CARDIAC CATH (CUSTOM PROCEDURE TRAY) ×3 IMPLANT
WIRE ROSEN-J .035X260CM (WIRE) ×3 IMPLANT

## 2017-06-30 NOTE — Progress Notes (Signed)
ANTICOAGULATION CONSULT NOTE - Initial Consult  Pharmacy Consult for heparin Indication: chest pain/ACS s/p cardiac arrest  No Known Allergies  Patient Measurements: Height: '5\' 11"'$  (180.3 cm) Weight: 226 lb (102.5 kg) IBW/kg (Calculated) : 75.3 Heparin Dosing Weight: 97 kg  Vital Signs: Temp: 96.4 F (35.8 C) (12/04 0930) Temp Source: Bladder (12/04 0900) BP: 120/85 (12/04 0930) Pulse Rate: 70 (12/04 0930)  Labs: Recent Labs    06/30/17 0126 06/30/17 0600 06/30/17 1211  HGB 15.0 13.7  --   HCT 44.2 39.5*  --   PLT 282 241  --   APTT  --  27  --   LABPROT  --  14.4  --   INR  --  1.13  --   CREATININE 1.22 1.04  0.99  --   TROPONINI 0.09* 4.92* 4.19*    Estimated Creatinine Clearance: 96.7 mL/min (by C-G formula based on SCr of 0.99 mg/dL).   Medical History: Past Medical History:  Diagnosis Date  . Allergy   . Benign mass of parotid gland   . GERD (gastroesophageal reflux disease)   . Hyperlipidemia    not on meds  . Hypertension   . Vertigo     Medications:  Scheduled:  . artificial tears  1 application Both Eyes L7L  . [START ON 07/01/2017] aspirin  81 mg Oral Pre-Cath  . [START ON 07/01/2017] aspirin EC  81 mg Oral Daily  . chlorhexidine gluconate (MEDLINE KIT)  15 mL Mouth Rinse BID  . insulin aspart  0-9 Units Subcutaneous Q4H  . ipratropium-albuterol  3 mL Nebulization Q6H  . mouth rinse  15 mL Mouth Rinse QID  . pantoprazole (PROTONIX) IV  40 mg Intravenous Daily  . sodium chloride flush  3 mL Intravenous Q12H  . sodium chloride flush  3 mL Intravenous Q12H    Assessment: Patient admitted for cardiac arrest trops up to 4.92. Patient is on TTM and is now s/p cath who will need CABG or PCI.  Goal of Therapy:  Heparin level 0.3-0.7 units/ml Monitor platelets by anticoagulation protocol: Yes   Plan:  Will resume heparin drip at 1100 units/hr with no bolus  2 hours after TR band deflation. Will check a HL 6 hours after resuming infusion.    Ulice Dash, PharmD Clinical Pharmacist   06/30/2017

## 2017-06-30 NOTE — Progress Notes (Signed)
[facility] Encounter Note  Patient: Jason Gross / Admit Date: 06/30/2017 / Date of Encounter: 06/30/2017, 5:37 PM   Subjective: Patient is still intubated status post what appears to be a non-ST elevation myocardial infarction Cardiac catheter shows mild LV systolic distant with occluded circumflex and right coronary artery which appeared to be old and have collateral blood flow from left anterior descending artery. Diagonal artery with some atherosclerosis and possible culprit lesion but small artery not amenable and not appropriate for PCI and stent placement. Left anterior descending artery with modest LAD stenosis and not requiring further intervention Review of Systems:  Cannot assess Objective: Telemetry: Normal sinus rhythm Physical Exam: Blood pressure 128/65, pulse 64, temperature (!) 97.5 F (36.4 C), resp. rate 18, height 5' 11" (1.803 m), weight 102.5 kg (226 lb), SpO2 100 %. Body mass index is 31.52 kg/m. General: Well developed, well nourished, in no acute distress. Head: Normocephalic, atraumatic, sclera non-icteric, no xanthomas, nares are without discharge. Neck: No apparent masses Lungs: Normal respirations with no wheezes, no rhonchi, no rales , no crackles   Heart: Regular rate and rhythm, normal S1 S2, no murmur, no rub, no gallop, PMI is normal size and placement, carotid upstroke normal without bruit, jugular venous pressure normal Abdomen: Soft, non-tender, non-distended with normoactive bowel sounds. No hepatosplenomegaly. Abdominal aorta is normal size without bruit Extremities: No edema, no clubbing, no cyanosis, no ulcers,  Peripheral: 2+ radial, 2+ femoral, 2+ dorsal pedal pulses Neuro: Not Alert and oriented. Does not Moves all extremities spontaneously. Psych:  Does not Responds to questions appropriately with a normal affect.   Intake/Output Summary (Last 24 hours) at 06/30/2017 1737 Last data filed at 06/30/2017 1700 Gross per 24  hour  Intake 842.84 ml  Output 1650 ml  Net -807.16 ml    Inpatient Medications:  . artificial tears  1 application Both Eyes P5F  . [START ON 07/01/2017] aspirin  81 mg Oral Pre-Cath  . [START ON 07/01/2017] aspirin EC  81 mg Oral Daily  . chlorhexidine gluconate (MEDLINE KIT)  15 mL Mouth Rinse BID  . insulin aspart  0-9 Units Subcutaneous Q4H  . ipratropium-albuterol  3 mL Nebulization Q6H  . mouth rinse  15 mL Mouth Rinse QID  . pantoprazole (PROTONIX) IV  40 mg Intravenous Daily  . sodium chloride flush  3 mL Intravenous Q12H  . sodium chloride flush  3 mL Intravenous Q12H   Infusions:  . sodium chloride    . sodium chloride    . [START ON 07/01/2017] sodium chloride     Followed by  . [START ON 07/01/2017] sodium chloride    . cisatracurium (NIMBEX) infusion 3 mcg/kg/min (06/30/17 1700)  . fentaNYL infusion INTRAVENOUS 250 mcg/hr (06/30/17 1700)  . fentaNYL infusion INTRAVENOUS 250 mcg/hr (06/30/17 1400)  . heparin 1,100 Units/hr (06/30/17 1700)  . lactated ringers 50 mL/hr at 06/30/17 1700  . midazolam (VERSED) infusion Stopped (06/30/17 1638)  . norepinephrine (LEVOPHED) Adult infusion Stopped (06/30/17 4665)  . piperacillin-tazobactam (ZOSYN)  IV 3.375 g (06/30/17 1511)  . propofol (DIPRIVAN) infusion 30 mcg/kg/min (06/30/17 0911)  . propofol (DIPRIVAN) infusion 40 mcg/kg/min (06/30/17 1700)    Labs: Recent Labs    06/30/17 0600 06/30/17 1532  NA 136 137  K 4.3 5.1  CL 108 107  CO2 23 23  GLUCOSE 171* 137*  BUN 14 14  CREATININE 1.04  0.99 0.78  CALCIUM 7.3* 7.0*  MG 1.6*  --   PHOS 3.3  --  Recent Labs    06/30/17 0126 06/30/17 0600  AST 74* 69*  ALT 68* 66*  ALKPHOS 51 36*  BILITOT 1.0 0.7  PROT 6.7 5.8*  ALBUMIN 3.7 3.2*   Recent Labs    06/30/17 0126 06/30/17 0600  WBC 17.6* 17.7*  HGB 15.0 13.7  HCT 44.2 39.5*  MCV 85.5 84.9  PLT 282 241   Recent Labs    06/30/17 0126 06/30/17 0600 06/30/17 1211 06/30/17 1540  TROPONINI 0.09*  4.92* 4.19* 3.16*   Invalid input(s): POCBNP Recent Labs    06/30/17 0600  HGBA1C 5.6     Weights: Filed Weights   06/30/17 0129  Weight: 102.5 kg (226 lb)     Radiology/Studies:  Dg Abdomen 1 View  Result Date: 06/30/2017 CLINICAL DATA:  Orogastric tube placement. EXAM: ABDOMEN - 1 VIEW COMPARISON:  None. FINDINGS: The bowel gas pattern is normal. Orogastric tube at the expected location of gastric body. Shock pads overlies the lower thorax/upper abdomen. IMPRESSION: Orogastric tube at the expected location of gastric body. Electronically Signed   By: Fidela Salisbury M.D.   On: 06/30/2017 02:52   Ct Head Wo Contrast  Result Date: 06/30/2017 CLINICAL DATA:  Found unresponsive. EXAM: CT HEAD WITHOUT CONTRAST CT CERVICAL SPINE WITHOUT CONTRAST TECHNIQUE: Multidetector CT imaging of the head and cervical spine was performed following the standard protocol without intravenous contrast. Multiplanar CT image reconstructions of the cervical spine were also generated. COMPARISON:  None. FINDINGS: CT HEAD FINDINGS Brain: No evidence of acute infarction, hemorrhage, hydrocephalus, extra-axial collection or mass lesion/mass effect. Vascular: No hyperdense vessel or unexpected calcification. Calcific atherosclerotic disease of the intracavernous internal carotid arteries. Skull: Normal. Negative for fracture or focal lesion. Sinuses/Orbits: No acute finding. Other: None. CT CERVICAL SPINE FINDINGS Alignment: Stranding of the cervical lordosis, likely position. Skull base and vertebrae: No acute fracture. No primary bone lesion or focal pathologic process. Soft tissues and spinal canal: No prevertebral fluid or swelling. No visible canal hematoma. Disc levels: Mild multilevel osteoarthritic changes of the cervical spine. Upper chest: Post intubation and placement of enteric catheter. Debris within the trachea. Airspace consolidation in the visualized posterior aspect of the lung parenchyma, possibly  due to aspiration. Other: None. IMPRESSION: No acute intracranial abnormality. No evidence of acute traumatic injury to cervical spine. Post intubation and placement of enteric catheter. Debris within the trachea. Airspace consolidation in the visualized posterior aspect of the lung parenchyma, possibly due to aspiration. Electronically Signed   By: Fidela Salisbury M.D.   On: 06/30/2017 03:03   Ct Cervical Spine Wo Contrast  Result Date: 06/30/2017 CLINICAL DATA:  Found unresponsive. EXAM: CT HEAD WITHOUT CONTRAST CT CERVICAL SPINE WITHOUT CONTRAST TECHNIQUE: Multidetector CT imaging of the head and cervical spine was performed following the standard protocol without intravenous contrast. Multiplanar CT image reconstructions of the cervical spine were also generated. COMPARISON:  None. FINDINGS: CT HEAD FINDINGS Brain: No evidence of acute infarction, hemorrhage, hydrocephalus, extra-axial collection or mass lesion/mass effect. Vascular: No hyperdense vessel or unexpected calcification. Calcific atherosclerotic disease of the intracavernous internal carotid arteries. Skull: Normal. Negative for fracture or focal lesion. Sinuses/Orbits: No acute finding. Other: None. CT CERVICAL SPINE FINDINGS Alignment: Stranding of the cervical lordosis, likely position. Skull base and vertebrae: No acute fracture. No primary bone lesion or focal pathologic process. Soft tissues and spinal canal: No prevertebral fluid or swelling. No visible canal hematoma. Disc levels: Mild multilevel osteoarthritic changes of the cervical spine. Upper chest: Post intubation and placement of  enteric catheter. Debris within the trachea. Airspace consolidation in the visualized posterior aspect of the lung parenchyma, possibly due to aspiration. Other: None. IMPRESSION: No acute intracranial abnormality. No evidence of acute traumatic injury to cervical spine. Post intubation and placement of enteric catheter. Debris within the trachea.  Airspace consolidation in the visualized posterior aspect of the lung parenchyma, possibly due to aspiration. Electronically Signed   By: Fidela Salisbury M.D.   On: 06/30/2017 03:03   Dg Chest Port 1 View  Result Date: 06/30/2017 CLINICAL DATA:  Endotracheal tube EXAM: PORTABLE CHEST 1 VIEW COMPARISON:  06/30/2017 FINDINGS: Endotracheal tube 2 cm above the carina. Right jugular catheter tip in the SVC. NG tube in the stomach. Mild improvement bibasilar atelectasis. Continued hypoventilation. Negative for heart failure or effusion. No pneumothorax IMPRESSION: Mild improvement hypoventilation and bibasilar atelectasis. Support lines remain in satisfactory position. Electronically Signed   By: Franchot Gallo M.D.   On: 06/30/2017 09:17   Dg Chest Port 1 View  Result Date: 06/30/2017 CLINICAL DATA:  Status post advancement of ET tube today. EXAM: PORTABLE CHEST 1 VIEW COMPARISON:  Single-view of the chest earlier today. FINDINGS: Endotracheal tube has been advanced. The tip is just below the thoracic inlet and the tube should be advanced 1-2 cm for better positioning. Defibrillator pad has been removed. Right IJ catheter remains in place. Subsegmental atelectasis left mid lung noted. There is cardiomegaly. IMPRESSION: ETT tip is just below the thoracic inlet and should be advanced 1-2 cm. No other change. Electronically Signed   By: Inge Rise M.D.   On: 06/30/2017 08:16   Dg Chest Port 1 View  Result Date: 06/30/2017 CLINICAL DATA:  Status post central line placement EXAM: PORTABLE CHEST 1 VIEW COMPARISON:  Chest x-ray and chest CT scan of earlier today FINDINGS: The lungs remain hypoinflated. The right internal jugular venous catheter tip projects over the junction of the proximal and middle thirds of the SVC. No postprocedure pneumothorax is observed. The cardiac silhouette is enlarged. The pulmonary vascularity is not clearly engorged. S sternal pacemaker defibrillator pads are present. The  esophagogastric tube tip in proximal port project in the proximal gastric body. The endotracheal tube tip appears to be somewhat high in position approximately 2 cm above the superior margin of the clavicular heads at approximately 8.1 cm above the carina. Known right anterior rib fractures are not demonstrated on today's study. IMPRESSION: No postprocedure complication following right internal jugular venous catheter placement. High positioning of the endotracheal tube. Advancement by at least 5 cm is recommended. Good positioning of the esophagogastric tube. Bilateral hypoinflation. Cardiomegaly without pulmonary vascular congestion. Electronically Signed   By: David  Martinique M.D.   On: 06/30/2017 06:56   Dg Chest Portable 1 View  Addendum Date: 06/30/2017   ADDENDUM REPORT: 06/30/2017 05:12 ADDENDUM: These results were called by telephone at the time of interpretation on 06/30/2017 at 2:55 am to Dr. Charlesetta Ivory , who verbally acknowledged these results. Electronically Signed   By: Fidela Salisbury M.D.   On: 06/30/2017 05:12   Result Date: 06/30/2017 CLINICAL DATA:  Intubation. EXAM: PORTABLE CHEST 1 VIEW COMPARISON:  10/28/2016 FINDINGS: Endotracheal tube at the level of T1 vertebral body. Tortuosity of the trachea at the cervicothoracic junction. Enteric catheter descends the abdomen. Shock pads overlie the chest. There is enlargement of the mediastinum which measures 12 cm. No evidence of pneumothorax. Low lung volumes. No focal airspace consolidation. Osseous structures are without acute abnormality. Soft tissues are grossly normal. IMPRESSION:  Apparent widening of the mediastinum. This may be due to portable technique and low lung volumes, however acute aortic pathology cannot be excluded. If clinically warranted, CT angiogram of the chest should be considered. Endotracheal tube at the cervicothoracic junction. Advancement by 5 cm is recommended. Bowling of the tracheal air column at the  cervicothoracic junction with uncertain significance. Electronically Signed: By: Fidela Salisbury M.D. On: 06/30/2017 02:50   Ct Angio Chest/abd/pel For Dissection W And/or Wo Contrast  Result Date: 06/30/2017 CLINICAL DATA:  Initial evaluation for acute cardiac arrest. EXAM: CT ANGIOGRAPHY CHEST, ABDOMEN AND PELVIS TECHNIQUE: Multidetector CT imaging through the chest, abdomen and pelvis was performed using the standard protocol during bolus administration of intravenous contrast. Multiplanar reconstructed images and MIPs were obtained and reviewed to evaluate the vascular anatomy. CONTRAST:  155m ISOVUE-370 IOPAMIDOL (ISOVUE-370) INJECTION 76% COMPARISON:  Prior radiograph from earlier same day. FINDINGS: CTA CHEST FINDINGS Cardiovascular: Precontrast imaging through the intrathoracic aorta at demonstrates no acute abnormality. Mild scattered atheromatous plaque within the aortic arch and descending intrathoracic aorta. Postcontrast imaging demonstrates no evidence for dissection or other acute abnormality. Partially visualized great vessels within normal limits. Cardiomegaly. No pericardial effusion. Limited evaluation of the pulmonary arterial tree grossly unremarkable. Mediastinum/Nodes: Thyroid normal. No enlarged mediastinal, hilar, or axillary lymph nodes. No mediastinal hematoma. Enteric tube in place within the esophagus. Lungs/Pleura: Endotracheal tube in place. Layering secretions within the subglottic trachea extending into the right mainstem bronchus. Deep tendon atelectasis present within the lower lobes bilaterally. Lungs are otherwise largely clear. No other focal infiltrates. No pulmonary edema or pleural effusion. No pneumothorax. No worrisome pulmonary nodule or mass. Musculoskeletal: External soft tissues demonstrate no acute abnormality. Acute nondisplaced fractures of the left anterior third through sixth ribs, which may be related to chest compressions. No other acute osseus  abnormality. No worrisome lytic or blastic osseous lesions. Review of the MIP images confirms the above findings. CTA ABDOMEN AND PELVIS FINDINGS VASCULAR Aorta: Intra-abdominal Eric or neural well opacified without evidence for dissection or other acute abnormality. No aneurysm. Moderate atherosclerosis. Celiac: Stenosis of approximately 40% present at the origin of the celiac. Celiac axis and its branch vessels well opacified distally. SMA: Atheromatous plaque at the proximal SMA without flow-limiting stenosis. SMA patent to its distal aspects. Renals: Single renal arteries present bilaterally. Atheromatous plaque about the origin of the renal artery is without high-grade stenosis. IMA: IMA patent to its distal aspect. Inflow: Iliac artery is widely patent bilaterally. Veins: No acute venous abnormality. Review of the MIP images confirms the above findings. NON-VASCULAR Hepatobiliary: The liver demonstrates no acute abnormality. Gallbladder within normal limits. No biliary dilatation. Pancreas: Pancreas within normal limits. Spleen: Spleen within normal limits. Adrenals/Urinary Tract: 2.7 cm heterogeneous right adrenal nodule, indeterminate. Adrenal glands otherwise unremarkable. Kidneys equal in size with symmetric enhancement. Few scattered renal cysts noted bilaterally, right greater than left. No nephrolithiasis or hydronephrosis. No focal enhancing renal mass. No hydroureter. Bladder largely decompressed with a Foley catheter in place. Stomach/Bowel: Enteric tube terminates within the stomach. No evidence for bowel obstruction. Normal appendix. No acute inflammatory changes seen about the bowels. Lymphatic: No adenopathy. Reproductive: Prostate normal. Other: No free air or fluid. Small fat containing paraumbilical hernia noted. Musculoskeletal: No acute osseus abnormality. No worrisome lytic or blastic osseous lesions. Mild scoliosis noted. Review of the MIP images confirms the above findings. IMPRESSION: 1.  No CT evidence for dissection or other acute aortic pathology. 2. Acute nondisplaced fractures of the left anterior third  through sixth ribs, which may be related to chest compressions given history of recent CPR. 3. Endotracheal and enteric tubes in place. Layering secretions within the subglottic trachea and right mainstem bronchus. Associated bibasilar atelectasis. 4. No other acute abnormality within the chest, abdomen, and pelvis. 5. 2.7 cm heterogeneous right adrenal nodule, indeterminate. Follow-up examination with nonemergent adrenal mass protocol CT and/or MRI suggested. Electronically Signed   By: Jeannine Boga M.D.   On: 06/30/2017 05:26     Assessment and Recommendation  61 y.o. male with acute non-ST elevation myocardial infarction with acute cardiac arrest status post CPR intubation and medication management treatment currently hemodynamically stable status post cardiac catheterization showing mild LV systolic dysfunction and significant two-vessel coronary artery disease but left anterior descending artery without critical stenosis not requiring further stenting or intervention at this time 1. Continue heparin for 48 more hours and then consider changing to aspirin and Plavix 2. No additional beta blocker or ACE inhibitor for myocardial infarction due to hypotension 3. Supportive care for neurologic damage from acute cardiac arrest 4. Continue pulmonary supportive care 5. No further cardiac diagnostics necessary at this time  Signed, Serafina Royals M.D. FACC

## 2017-06-30 NOTE — ED Notes (Signed)
Pt extremely agitated.

## 2017-06-30 NOTE — Progress Notes (Signed)
Cath Lab Visit (complete for each Cath Lab visit)  Clinical Evaluation Leading to the Procedure:   ACS: Yes.    Non-ACS:  N/A  Nelva Bush, MD [place] Pager: [phone]

## 2017-06-30 NOTE — Procedures (Signed)
Arterial Catheter Insertion Procedure Note CORDELLE DAHMEN [id number] Dec 04, 1955  Procedure: Insertion of Arterial Catheter  Indications: Blood pressure monitoring and Frequent blood sampling  Procedure Details Consent: Unable to obtain consent because of emergent medical necessity. Time Out: Verified patient identification, verified procedure, site/side was marked, verified correct patient position, special equipment/implants available, medications/allergies/relevent history reviewed, required imaging and test results available.  Performed  Maximum sterile technique was used including antiseptics, cap, gloves, gown, hand hygiene, mask and sheet. Skin prep: Chlorhexidine; local anesthetic administered 20 gauge catheter was inserted into right radial artery using the Seldinger technique.  Evaluation Blood flow good; BP tracing good. Complications: No apparent complications.  Procedure performed under direct supervision of Dr.SIMONDS. Ultrasound utilized for realtime vessel cannulation  Geroldine Esquivias [facility] ANP-BC Pulmonary and Critical Care Medicine [facility] Pager [phone] or [phone] 06/30/2017

## 2017-06-30 NOTE — Progress Notes (Signed)
Tube advanced to 24 per CXR result

## 2017-06-30 NOTE — Progress Notes (Signed)
Pt transported to cath lab.  

## 2017-06-30 NOTE — Procedures (Signed)
Central Venous Catheter Insertion Procedure Note Jason Gross [id number] May 24, 1956  Procedure: Insertion of Central Venous Catheter Indications: Assessment of intravascular volume, Drug and/or fluid administration and Frequent blood sampling  Procedure Details Consent: Unable to obtain consent because of emergent medical necessity. Time Out: Verified patient identification, verified procedure, site/side was marked, verified correct patient position, special equipment/implants available, medications/allergies/relevent history reviewed, required imaging and test results available.  Performed  Maximum sterile technique was used including antiseptics, cap, gloves, gown, hand hygiene, mask and sheet. Skin prep: Chlorhexidine; local anesthetic administered A antimicrobial bonded/coated triple lumen catheter was placed in the right internal jugular vein using the Seldinger technique.  Evaluation Blood flow good Complications: No apparent complications Patient did tolerate procedure well. Chest X-ray ordered to verify placement.  CXR: normal.  Procedure performed under direct supervision of Dr.Simonds. Ultrasound utilized for realtime vessel cannulation  Magdalene [facility] ANP-BC Pulmonary and Critical Care Medicine [facility] Pager [phone] or [phone] 06/30/2017, 8:04 AM

## 2017-06-30 NOTE — Consult Note (Signed)
[name] Consultation Note  Patient ID: Jason Gross, MRN: [id number], DOB/AGE: 1956/06/02 61 y.o. Admit date: 06/30/2017   Date of Consult: 06/30/2017 Primary Physician: Venia Carbon, MD Primary Cardiologist: None none  Chief Complaint:  Chief Complaint  Patient presents with  . unresponsive   Reason for Consult: acute arrest  HPI: 61 y.o. male with known essential hypertension mixed hyperlipidemia and family history of cardiovascular disease who has had an ill feeling over the last several weeks and a significant ill feeling last weekend. He was sleeping and awakened with heartburn for which the patient had significant chest discomfort. He went to get a drink and collapse. The patient's of wife did CPR for approximately 10 minutes prior to arrival of the EMS for which showed the patient had a shockable rhythm with towards ventricular fibrillation. At that time the patient did respond to appropriate ACLS protocol and did recover from that but has not been able to recover neurologically. The patient is currently hemodynamically stable with a blood pressure 80/60 and a heart rate of 66 bpm with slight EKG changes with T-wave inversion in the inferior leads and a troponin of 4.6. The patient therefore also had a CAT scan showing no evidence of pulmonary embolism or aortic dissection or other changes all now consistent with concerns of acute myocardial infarction with acute arrest. We have talked with the family at length the about further evaluation and treatment including a cardiac catheterization to assess coronary anatomy for acute rest and further medical management thereafter. The patient family understand and agree with current process  Past Medical History:  Diagnosis Date  . Allergy   . Benign mass of parotid gland   . GERD (gastroesophageal reflux disease)   . Hyperlipidemia    not on meds  . Hypertension   . Vertigo       Surgical History:  Past Surgical  History:  Procedure Laterality Date  . PAROTIDECTOMY Left 01/07/2017   Procedure: PAROTIDECTOMY (SUPERFICIAL);  Surgeon: Clyde Canterbury, MD;  Location: [facility] ORS;  Service: ENT;  Laterality: Left;     Home Meds: Prior to Admission medications   Medication Sig Start Date End Date Taking? Authorizing Provider  cetirizine (ZYRTEC) 10 MG tablet Take 10 mg by mouth daily.     Yes [provider]  ibuprofen (ADVIL,MOTRIN) 200 MG tablet Take 200 mg by mouth every 6 (six) hours as needed.   Yes [provider]  lisinopril-hydrochlorothiazide (PRINZIDE,ZESTORETIC) 10-12.5 MG tablet TAKE 1 TABLET DAILY 12/12/16  Yes Venia Carbon, MD    Inpatient Medications:  . aspirin  325 mg Per Tube Daily  . chlorhexidine gluconate (MEDLINE KIT)  15 mL Mouth Rinse BID  . heparin  4,000 Units Intravenous Once  . insulin aspart  0-15 Units Subcutaneous Q4H  . ipratropium-albuterol  3 mL Nebulization Q6H  . mouth rinse  15 mL Mouth Rinse QID  . pantoprazole (PROTONIX) IV  40 mg Intravenous Daily   . sodium chloride    . fentaNYL infusion INTRAVENOUS 200 mcg/hr (06/30/17 0459)  . heparin    . magnesium sulfate 1 - 4 g bolus IVPB    . midazolam (VERSED) infusion Stopped (06/30/17 3007)  . norepinephrine (LEVOPHED) Adult infusion 2 mcg/min (06/30/17 0814)  . piperacillin-tazobactam (ZOSYN)  IV    . potassium chloride    . propofol (DIPRIVAN) infusion 25 mcg/kg/min (06/30/17 6226)    Allergies: No Known Allergies  Social History   Socioeconomic History  . Marital  status: Married    Spouse name: Not on file  . Number of children: 1  . Years of education: Not on file  . Highest education level: Not on file  Social Needs  . Financial resource strain: Not on file  . Food insecurity - worry: Not on file  . Food insecurity - inability: Not on file  . Transportation needs - medical: Not on file  . Transportation needs - non-medical: Not on file  Occupational History  . Occupation:  Furniture conservator/restorer @ [employer]  . Smoking status: Former Smoker    Last attempt to quit: 07/28/1968    Years since quitting: 48.9  . Smokeless tobacco: Never Used  Substance and Sexual Activity  . Alcohol use: No    Comment: occasional  . Drug use: No  . Sexual activity: Yes  Other Topics Concern  . Not on file  Social History Narrative  . Not on file     Family History  Problem Relation Age of Onset  . Healthy Mother   . Coronary artery disease Father   . Diabetes Sister   . Diabetes Maternal Uncle   . Coronary artery disease Maternal Grandfather   . Diabetes Maternal Grandfather   . Esophageal cancer Paternal Uncle   . Hypertension Neg Hx   . Cancer Neg Hx   . Colon cancer Neg Hx   . Colon polyps Neg Hx   . Rectal cancer Neg Hx   . Stomach cancer Neg Hx      Review of Systems  Cannot assess due to intubation and sedation Labs: Recent Labs    06/30/17 0126 06/30/17 0600  TROPONINI 0.09* 4.92*   Lab Results  Component Value Date   WBC 17.7 (H) 06/30/2017   HGB 13.7 06/30/2017   HCT 39.5 (L) 06/30/2017   MCV 84.9 06/30/2017   PLT 241 06/30/2017    Recent Labs  Lab 06/30/17 0600  NA 136  K 4.3  CL 108  CO2 23  BUN 14  CREATININE 1.04  0.99  CALCIUM 7.3*  PROT 5.8*  BILITOT 0.7  ALKPHOS 36*  ALT 66*  AST 69*  GLUCOSE 171*   Lab Results  Component Value Date   CHOL 153 06/30/2017   HDL 28 (L) 06/30/2017   LDLCALC 91 06/30/2017   TRIG 168 (H) 06/30/2017   TRIG 170 (H) 06/30/2017   No results found for: DDIMER  Radiology/Studies:  Dg Abdomen 1 View  Result Date: 06/30/2017 CLINICAL DATA:  Orogastric tube placement. EXAM: ABDOMEN - 1 VIEW COMPARISON:  None. FINDINGS: The bowel gas pattern is normal. Orogastric tube at the expected location of gastric body. Shock pads overlies the lower thorax/upper abdomen. IMPRESSION: Orogastric tube at the expected location of gastric body. Electronically Signed   By: Fidela Salisbury M.D.   On:  06/30/2017 02:52   Ct Head Wo Contrast  Result Date: 06/30/2017 CLINICAL DATA:  Found unresponsive. EXAM: CT HEAD WITHOUT CONTRAST CT CERVICAL SPINE WITHOUT CONTRAST TECHNIQUE: Multidetector CT imaging of the head and cervical spine was performed following the standard protocol without intravenous contrast. Multiplanar CT image reconstructions of the cervical spine were also generated. COMPARISON:  None. FINDINGS: CT HEAD FINDINGS Brain: No evidence of acute infarction, hemorrhage, hydrocephalus, extra-axial collection or mass lesion/mass effect. Vascular: No hyperdense vessel or unexpected calcification. Calcific atherosclerotic disease of the intracavernous internal carotid arteries. Skull: Normal. Negative for fracture or focal lesion. Sinuses/Orbits: No acute finding. Other: None. CT CERVICAL SPINE FINDINGS Alignment: Stranding  of the cervical lordosis, likely position. Skull base and vertebrae: No acute fracture. No primary bone lesion or focal pathologic process. Soft tissues and spinal canal: No prevertebral fluid or swelling. No visible canal hematoma. Disc levels: Mild multilevel osteoarthritic changes of the cervical spine. Upper chest: Post intubation and placement of enteric catheter. Debris within the trachea. Airspace consolidation in the visualized posterior aspect of the lung parenchyma, possibly due to aspiration. Other: None. IMPRESSION: No acute intracranial abnormality. No evidence of acute traumatic injury to cervical spine. Post intubation and placement of enteric catheter. Debris within the trachea. Airspace consolidation in the visualized posterior aspect of the lung parenchyma, possibly due to aspiration. Electronically Signed   By: Fidela Salisbury M.D.   On: 06/30/2017 03:03   Ct Cervical Spine Wo Contrast  Result Date: 06/30/2017 CLINICAL DATA:  Found unresponsive. EXAM: CT HEAD WITHOUT CONTRAST CT CERVICAL SPINE WITHOUT CONTRAST TECHNIQUE: Multidetector CT imaging of the head  and cervical spine was performed following the standard protocol without intravenous contrast. Multiplanar CT image reconstructions of the cervical spine were also generated. COMPARISON:  None. FINDINGS: CT HEAD FINDINGS Brain: No evidence of acute infarction, hemorrhage, hydrocephalus, extra-axial collection or mass lesion/mass effect. Vascular: No hyperdense vessel or unexpected calcification. Calcific atherosclerotic disease of the intracavernous internal carotid arteries. Skull: Normal. Negative for fracture or focal lesion. Sinuses/Orbits: No acute finding. Other: None. CT CERVICAL SPINE FINDINGS Alignment: Stranding of the cervical lordosis, likely position. Skull base and vertebrae: No acute fracture. No primary bone lesion or focal pathologic process. Soft tissues and spinal canal: No prevertebral fluid or swelling. No visible canal hematoma. Disc levels: Mild multilevel osteoarthritic changes of the cervical spine. Upper chest: Post intubation and placement of enteric catheter. Debris within the trachea. Airspace consolidation in the visualized posterior aspect of the lung parenchyma, possibly due to aspiration. Other: None. IMPRESSION: No acute intracranial abnormality. No evidence of acute traumatic injury to cervical spine. Post intubation and placement of enteric catheter. Debris within the trachea. Airspace consolidation in the visualized posterior aspect of the lung parenchyma, possibly due to aspiration. Electronically Signed   By: Fidela Salisbury M.D.   On: 06/30/2017 03:03   Dg Chest Port 1 View  Result Date: 06/30/2017 CLINICAL DATA:  Status post advancement of ET tube today. EXAM: PORTABLE CHEST 1 VIEW COMPARISON:  Single-view of the chest earlier today. FINDINGS: Endotracheal tube has been advanced. The tip is just below the thoracic inlet and the tube should be advanced 1-2 cm for better positioning. Defibrillator pad has been removed. Right IJ catheter remains in place. Subsegmental  atelectasis left mid lung noted. There is cardiomegaly. IMPRESSION: ETT tip is just below the thoracic inlet and should be advanced 1-2 cm. No other change. Electronically Signed   By: Inge Rise M.D.   On: 06/30/2017 08:16   Dg Chest Port 1 View  Result Date: 06/30/2017 CLINICAL DATA:  Status post central line placement EXAM: PORTABLE CHEST 1 VIEW COMPARISON:  Chest x-ray and chest CT scan of earlier today FINDINGS: The lungs remain hypoinflated. The right internal jugular venous catheter tip projects over the junction of the proximal and middle thirds of the SVC. No postprocedure pneumothorax is observed. The cardiac silhouette is enlarged. The pulmonary vascularity is not clearly engorged. S sternal pacemaker defibrillator pads are present. The esophagogastric tube tip in proximal port project in the proximal gastric body. The endotracheal tube tip appears to be somewhat high in position approximately 2 cm above the superior margin of  the clavicular heads at approximately 8.1 cm above the carina. Known right anterior rib fractures are not demonstrated on today's study. IMPRESSION: No postprocedure complication following right internal jugular venous catheter placement. High positioning of the endotracheal tube. Advancement by at least 5 cm is recommended. Good positioning of the esophagogastric tube. Bilateral hypoinflation. Cardiomegaly without pulmonary vascular congestion. Electronically Signed   By: David  Martinique M.D.   On: 06/30/2017 06:56   Dg Chest Portable 1 View  Addendum Date: 06/30/2017   ADDENDUM REPORT: 06/30/2017 05:12 ADDENDUM: These results were called by telephone at the time of interpretation on 06/30/2017 at 2:55 am to Dr. Charlesetta Ivory , who verbally acknowledged these results. Electronically Signed   By: Fidela Salisbury M.D.   On: 06/30/2017 05:12   Result Date: 06/30/2017 CLINICAL DATA:  Intubation. EXAM: PORTABLE CHEST 1 VIEW COMPARISON:  10/28/2016 FINDINGS:  Endotracheal tube at the level of T1 vertebral body. Tortuosity of the trachea at the cervicothoracic junction. Enteric catheter descends the abdomen. Shock pads overlie the chest. There is enlargement of the mediastinum which measures 12 cm. No evidence of pneumothorax. Low lung volumes. No focal airspace consolidation. Osseous structures are without acute abnormality. Soft tissues are grossly normal. IMPRESSION: Apparent widening of the mediastinum. This may be due to portable technique and low lung volumes, however acute aortic pathology cannot be excluded. If clinically warranted, CT angiogram of the chest should be considered. Endotracheal tube at the cervicothoracic junction. Advancement by 5 cm is recommended. Bowling of the tracheal air column at the cervicothoracic junction with uncertain significance. Electronically Signed: By: Fidela Salisbury M.D. On: 06/30/2017 02:50   Ct Angio Chest/abd/pel For Dissection W And/or Wo Contrast  Result Date: 06/30/2017 CLINICAL DATA:  Initial evaluation for acute cardiac arrest. EXAM: CT ANGIOGRAPHY CHEST, ABDOMEN AND PELVIS TECHNIQUE: Multidetector CT imaging through the chest, abdomen and pelvis was performed using the standard protocol during bolus administration of intravenous contrast. Multiplanar reconstructed images and MIPs were obtained and reviewed to evaluate the vascular anatomy. CONTRAST:  136m ISOVUE-370 IOPAMIDOL (ISOVUE-370) INJECTION 76% COMPARISON:  Prior radiograph from earlier same day. FINDINGS: CTA CHEST FINDINGS Cardiovascular: Precontrast imaging through the intrathoracic aorta at demonstrates no acute abnormality. Mild scattered atheromatous plaque within the aortic arch and descending intrathoracic aorta. Postcontrast imaging demonstrates no evidence for dissection or other acute abnormality. Partially visualized great vessels within normal limits. Cardiomegaly. No pericardial effusion. Limited evaluation of the pulmonary arterial tree  grossly unremarkable. Mediastinum/Nodes: Thyroid normal. No enlarged mediastinal, hilar, or axillary lymph nodes. No mediastinal hematoma. Enteric tube in place within the esophagus. Lungs/Pleura: Endotracheal tube in place. Layering secretions within the subglottic trachea extending into the right mainstem bronchus. Deep tendon atelectasis present within the lower lobes bilaterally. Lungs are otherwise largely clear. No other focal infiltrates. No pulmonary edema or pleural effusion. No pneumothorax. No worrisome pulmonary nodule or mass. Musculoskeletal: External soft tissues demonstrate no acute abnormality. Acute nondisplaced fractures of the left anterior third through sixth ribs, which may be related to chest compressions. No other acute osseus abnormality. No worrisome lytic or blastic osseous lesions. Review of the MIP images confirms the above findings. CTA ABDOMEN AND PELVIS FINDINGS VASCULAR Aorta: Intra-abdominal Eric or neural well opacified without evidence for dissection or other acute abnormality. No aneurysm. Moderate atherosclerosis. Celiac: Stenosis of approximately 40% present at the origin of the celiac. Celiac axis and its branch vessels well opacified distally. SMA: Atheromatous plaque at the proximal SMA without flow-limiting stenosis. SMA patent to its distal  aspects. Renals: Single renal arteries present bilaterally. Atheromatous plaque about the origin of the renal artery is without high-grade stenosis. IMA: IMA patent to its distal aspect. Inflow: Iliac artery is widely patent bilaterally. Veins: No acute venous abnormality. Review of the MIP images confirms the above findings. NON-VASCULAR Hepatobiliary: The liver demonstrates no acute abnormality. Gallbladder within normal limits. No biliary dilatation. Pancreas: Pancreas within normal limits. Spleen: Spleen within normal limits. Adrenals/Urinary Tract: 2.7 cm heterogeneous right adrenal nodule, indeterminate. Adrenal glands otherwise  unremarkable. Kidneys equal in size with symmetric enhancement. Few scattered renal cysts noted bilaterally, right greater than left. No nephrolithiasis or hydronephrosis. No focal enhancing renal mass. No hydroureter. Bladder largely decompressed with a Foley catheter in place. Stomach/Bowel: Enteric tube terminates within the stomach. No evidence for bowel obstruction. Normal appendix. No acute inflammatory changes seen about the bowels. Lymphatic: No adenopathy. Reproductive: Prostate normal. Other: No free air or fluid. Small fat containing paraumbilical hernia noted. Musculoskeletal: No acute osseus abnormality. No worrisome lytic or blastic osseous lesions. Mild scoliosis noted. Review of the MIP images confirms the above findings. IMPRESSION: 1. No CT evidence for dissection or other acute aortic pathology. 2. Acute nondisplaced fractures of the left anterior third through sixth ribs, which may be related to chest compressions given history of recent CPR. 3. Endotracheal and enteric tubes in place. Layering secretions within the subglottic trachea and right mainstem bronchus. Associated bibasilar atelectasis. 4. No other acute abnormality within the chest, abdomen, and pelvis. 5. 2.7 cm heterogeneous right adrenal nodule, indeterminate. Follow-up examination with nonemergent adrenal mass protocol CT and/or MRI suggested. Electronically Signed   By: Jeannine Boga M.D.   On: 06/30/2017 05:26    EKG: Normal sinus rhythm with T-wave inversion  Weights: Filed Weights   06/30/17 0129  Weight: 102.5 kg (226 lb)     Physical Exam: Blood pressure 90/66, pulse 63, temperature (!) 96.8 F (36 C), temperature source Bladder, resp. rate (!) 21, height _0  (1.803 m), weight 102.5 kg (226 lb), SpO2 100 %. Body mass index is 31.52 kg/m. General: Well developed, well nourished,  . Head eyes ears nose throat: Normocephalic, atraumatic, sclera non-icteric, no xanthomas, nares are without discharge. No  apparent thyromegaly and/or mass  Lungs: Normal respiratory effort.  no wheezes, no rales, no rhonchi.  Heart: RRR with normal S1 S2. no murmur gallop, no rub, PMI is normal size and placement, carotid upstroke normal without bruit, jugular venous pressure is normal Abdomen: Soft, non-tender, non-distended with normoactive bowel sounds. No hepatomegaly. No rebound/guarding. No obvious abdominal masses. Abdominal aorta is normal size without bruit Extremities: No edema. no cyanosis, no clubbing, no ulcers  Peripheral : 2+ bilateral upper extremity pulses, 2+ bilateral femoral pulses, 2+ bilateral dorsal pedal pulse Neuro: Not Alert and oriented. No facial asymmetry. No focal deficit. Does not Moves all extremities spontaneously. Musculoskeletal: Normal muscle tone without kyphosis Psych:  Does not Responds to questions appropriately with a normal affect.    Assessment: 61 year old male with hypertension hyperlipidemia with acute ventricular fibrillation arrest and elevated troponin consistent with myocardial infarction  Plan: 1. Continue supportive care for pressure support and ventilatory support 2. Proceed to cardiac catheterization to assess coronary anatomy and further treatment thereof is necessary. Patient's family understands risk and benefits cardiac catheterization. This includes possibility of death stroke heart attack infection bleeding blood clot. Patient is already with conscious sedation 3. Further treatment options after above  Signed, Corey Skains M.D. [facility] 06/30/2017, 8:26 AM

## 2017-06-30 NOTE — Progress Notes (Signed)
Pharmacy Antibiotic Note  Jason Gross is a 61 y.o. male admitted on 06/30/2017 with pneumonia.  Pharmacy has been consulted for zosyn dosing.  Plan: Zosyn 3.375g IV q8h (4 hour infusion).  Height: 5\' 11"  (180.3 cm) Weight: 226 lb (102.5 kg) IBW/kg (Calculated) : 75.3  Temp (24hrs), Avg:94.9 F (34.9 C), Min:92.8 F (33.8 C), Max:96 F (35.6 C)  Recent Labs  Lab 06/30/17 0126 06/30/17 0200  WBC 17.6*  --   CREATININE 1.22  --   LATICACIDVEN  --  5.0*    Estimated Creatinine Clearance: 78.5 mL/min (by C-G formula based on SCr of 1.22 mg/dL).    No Known Allergies   Thank you for allowing pharmacy to be a part of this patient's care.  Tobie Lords, PharmD, BCPS Clinical Pharmacist 06/30/2017

## 2017-06-30 NOTE — Interval H&P Note (Signed)
History and Physical Interval Note:  06/30/2017 10:04 AM  Jason Gross  has presented today for cardiac catheterization, with the diagnosis of cardiac arrest. The various methods of treatment have been discussed with the patient and family. After consideration of risks, benefits and other options for treatment, the patient has consented to  Procedure(s): LEFT HEART CATH AND CORONARY ANGIOGRAPHY (N/A) as a surgical intervention .  The patient's history has been reviewed, patient examined, no change in status, stable for surgery.  I have reviewed the patient's chart and labs.  Questions were answered to the patient's satisfaction.     Addyson Traub

## 2017-06-30 NOTE — H&P (View-Only) (Signed)
[name] Consultation Note  Patient ID: Jason Gross, MRN: [id number], DOB/AGE: 1956/06/02 61 y.o. Admit date: 06/30/2017   Date of Consult: 06/30/2017 Primary Physician: Venia Carbon, MD Primary Cardiologist: None none  Chief Complaint:  Chief Complaint  Patient presents with  . unresponsive   Reason for Consult: acute arrest  HPI: 61 y.o. male with known essential hypertension mixed hyperlipidemia and family history of cardiovascular disease who has had an ill feeling over the last several weeks and a significant ill feeling last weekend. He was sleeping and awakened with heartburn for which the patient had significant chest discomfort. He went to get a drink and collapse. The patient's of wife did CPR for approximately 10 minutes prior to arrival of the EMS for which showed the patient had a shockable rhythm with towards ventricular fibrillation. At that time the patient did respond to appropriate ACLS protocol and did recover from that but has not been able to recover neurologically. The patient is currently hemodynamically stable with a blood pressure 80/60 and a heart rate of 66 bpm with slight EKG changes with T-wave inversion in the inferior leads and a troponin of 4.6. The patient therefore also had a CAT scan showing no evidence of pulmonary embolism or aortic dissection or other changes all now consistent with concerns of acute myocardial infarction with acute arrest. We have talked with the family at length the about further evaluation and treatment including a cardiac catheterization to assess coronary anatomy for acute rest and further medical management thereafter. The patient family understand and agree with current process  Past Medical History:  Diagnosis Date  . Allergy   . Benign mass of parotid gland   . GERD (gastroesophageal reflux disease)   . Hyperlipidemia    not on meds  . Hypertension   . Vertigo       Surgical History:  Past Surgical  History:  Procedure Laterality Date  . PAROTIDECTOMY Left 01/07/2017   Procedure: PAROTIDECTOMY (SUPERFICIAL);  Surgeon: Clyde Canterbury, MD;  Location: [facility] ORS;  Service: ENT;  Laterality: Left;     Home Meds: Prior to Admission medications   Medication Sig Start Date End Date Taking? Authorizing Provider  cetirizine (ZYRTEC) 10 MG tablet Take 10 mg by mouth daily.     Yes [provider]  ibuprofen (ADVIL,MOTRIN) 200 MG tablet Take 200 mg by mouth every 6 (six) hours as needed.   Yes [provider]  lisinopril-hydrochlorothiazide (PRINZIDE,ZESTORETIC) 10-12.5 MG tablet TAKE 1 TABLET DAILY 12/12/16  Yes Venia Carbon, MD    Inpatient Medications:  . aspirin  325 mg Per Tube Daily  . chlorhexidine gluconate (MEDLINE KIT)  15 mL Mouth Rinse BID  . heparin  4,000 Units Intravenous Once  . insulin aspart  0-15 Units Subcutaneous Q4H  . ipratropium-albuterol  3 mL Nebulization Q6H  . mouth rinse  15 mL Mouth Rinse QID  . pantoprazole (PROTONIX) IV  40 mg Intravenous Daily   . sodium chloride    . fentaNYL infusion INTRAVENOUS 200 mcg/hr (06/30/17 0459)  . heparin    . magnesium sulfate 1 - 4 g bolus IVPB    . midazolam (VERSED) infusion Stopped (06/30/17 3007)  . norepinephrine (LEVOPHED) Adult infusion 2 mcg/min (06/30/17 0814)  . piperacillin-tazobactam (ZOSYN)  IV    . potassium chloride    . propofol (DIPRIVAN) infusion 25 mcg/kg/min (06/30/17 6226)    Allergies: No Known Allergies  Social History   Socioeconomic History  . Marital  status: Married    Spouse name: Not on file  . Number of children: 1  . Years of education: Not on file  . Highest education level: Not on file  Social Needs  . Financial resource strain: Not on file  . Food insecurity - worry: Not on file  . Food insecurity - inability: Not on file  . Transportation needs - medical: Not on file  . Transportation needs - non-medical: Not on file  Occupational History  . Occupation:  Furniture conservator/restorer @ [employer]  . Smoking status: Former Smoker    Last attempt to quit: 07/28/1968    Years since quitting: 48.9  . Smokeless tobacco: Never Used  Substance and Sexual Activity  . Alcohol use: No    Comment: occasional  . Drug use: No  . Sexual activity: Yes  Other Topics Concern  . Not on file  Social History Narrative  . Not on file     Family History  Problem Relation Age of Onset  . Healthy Mother   . Coronary artery disease Father   . Diabetes Sister   . Diabetes Maternal Uncle   . Coronary artery disease Maternal Grandfather   . Diabetes Maternal Grandfather   . Esophageal cancer Paternal Uncle   . Hypertension Neg Hx   . Cancer Neg Hx   . Colon cancer Neg Hx   . Colon polyps Neg Hx   . Rectal cancer Neg Hx   . Stomach cancer Neg Hx      Review of Systems  Cannot assess due to intubation and sedation Labs: Recent Labs    06/30/17 0126 06/30/17 0600  TROPONINI 0.09* 4.92*   Lab Results  Component Value Date   WBC 17.7 (H) 06/30/2017   HGB 13.7 06/30/2017   HCT 39.5 (L) 06/30/2017   MCV 84.9 06/30/2017   PLT 241 06/30/2017    Recent Labs  Lab 06/30/17 0600  NA 136  K 4.3  CL 108  CO2 23  BUN 14  CREATININE 1.04  0.99  CALCIUM 7.3*  PROT 5.8*  BILITOT 0.7  ALKPHOS 36*  ALT 66*  AST 69*  GLUCOSE 171*   Lab Results  Component Value Date   CHOL 153 06/30/2017   HDL 28 (L) 06/30/2017   LDLCALC 91 06/30/2017   TRIG 168 (H) 06/30/2017   TRIG 170 (H) 06/30/2017   No results found for: DDIMER  Radiology/Studies:  Dg Abdomen 1 View  Result Date: 06/30/2017 CLINICAL DATA:  Orogastric tube placement. EXAM: ABDOMEN - 1 VIEW COMPARISON:  None. FINDINGS: The bowel gas pattern is normal. Orogastric tube at the expected location of gastric body. Shock pads overlies the lower thorax/upper abdomen. IMPRESSION: Orogastric tube at the expected location of gastric body. Electronically Signed   By: Fidela Salisbury M.D.   On:  06/30/2017 02:52   Ct Head Wo Contrast  Result Date: 06/30/2017 CLINICAL DATA:  Found unresponsive. EXAM: CT HEAD WITHOUT CONTRAST CT CERVICAL SPINE WITHOUT CONTRAST TECHNIQUE: Multidetector CT imaging of the head and cervical spine was performed following the standard protocol without intravenous contrast. Multiplanar CT image reconstructions of the cervical spine were also generated. COMPARISON:  None. FINDINGS: CT HEAD FINDINGS Brain: No evidence of acute infarction, hemorrhage, hydrocephalus, extra-axial collection or mass lesion/mass effect. Vascular: No hyperdense vessel or unexpected calcification. Calcific atherosclerotic disease of the intracavernous internal carotid arteries. Skull: Normal. Negative for fracture or focal lesion. Sinuses/Orbits: No acute finding. Other: None. CT CERVICAL SPINE FINDINGS Alignment: Stranding  of the cervical lordosis, likely position. Skull base and vertebrae: No acute fracture. No primary bone lesion or focal pathologic process. Soft tissues and spinal canal: No prevertebral fluid or swelling. No visible canal hematoma. Disc levels: Mild multilevel osteoarthritic changes of the cervical spine. Upper chest: Post intubation and placement of enteric catheter. Debris within the trachea. Airspace consolidation in the visualized posterior aspect of the lung parenchyma, possibly due to aspiration. Other: None. IMPRESSION: No acute intracranial abnormality. No evidence of acute traumatic injury to cervical spine. Post intubation and placement of enteric catheter. Debris within the trachea. Airspace consolidation in the visualized posterior aspect of the lung parenchyma, possibly due to aspiration. Electronically Signed   By: Fidela Salisbury M.D.   On: 06/30/2017 03:03   Ct Cervical Spine Wo Contrast  Result Date: 06/30/2017 CLINICAL DATA:  Found unresponsive. EXAM: CT HEAD WITHOUT CONTRAST CT CERVICAL SPINE WITHOUT CONTRAST TECHNIQUE: Multidetector CT imaging of the head  and cervical spine was performed following the standard protocol without intravenous contrast. Multiplanar CT image reconstructions of the cervical spine were also generated. COMPARISON:  None. FINDINGS: CT HEAD FINDINGS Brain: No evidence of acute infarction, hemorrhage, hydrocephalus, extra-axial collection or mass lesion/mass effect. Vascular: No hyperdense vessel or unexpected calcification. Calcific atherosclerotic disease of the intracavernous internal carotid arteries. Skull: Normal. Negative for fracture or focal lesion. Sinuses/Orbits: No acute finding. Other: None. CT CERVICAL SPINE FINDINGS Alignment: Stranding of the cervical lordosis, likely position. Skull base and vertebrae: No acute fracture. No primary bone lesion or focal pathologic process. Soft tissues and spinal canal: No prevertebral fluid or swelling. No visible canal hematoma. Disc levels: Mild multilevel osteoarthritic changes of the cervical spine. Upper chest: Post intubation and placement of enteric catheter. Debris within the trachea. Airspace consolidation in the visualized posterior aspect of the lung parenchyma, possibly due to aspiration. Other: None. IMPRESSION: No acute intracranial abnormality. No evidence of acute traumatic injury to cervical spine. Post intubation and placement of enteric catheter. Debris within the trachea. Airspace consolidation in the visualized posterior aspect of the lung parenchyma, possibly due to aspiration. Electronically Signed   By: Fidela Salisbury M.D.   On: 06/30/2017 03:03   Dg Chest Port 1 View  Result Date: 06/30/2017 CLINICAL DATA:  Status post advancement of ET tube today. EXAM: PORTABLE CHEST 1 VIEW COMPARISON:  Single-view of the chest earlier today. FINDINGS: Endotracheal tube has been advanced. The tip is just below the thoracic inlet and the tube should be advanced 1-2 cm for better positioning. Defibrillator pad has been removed. Right IJ catheter remains in place. Subsegmental  atelectasis left mid lung noted. There is cardiomegaly. IMPRESSION: ETT tip is just below the thoracic inlet and should be advanced 1-2 cm. No other change. Electronically Signed   By: Inge Rise M.D.   On: 06/30/2017 08:16   Dg Chest Port 1 View  Result Date: 06/30/2017 CLINICAL DATA:  Status post central line placement EXAM: PORTABLE CHEST 1 VIEW COMPARISON:  Chest x-ray and chest CT scan of earlier today FINDINGS: The lungs remain hypoinflated. The right internal jugular venous catheter tip projects over the junction of the proximal and middle thirds of the SVC. No postprocedure pneumothorax is observed. The cardiac silhouette is enlarged. The pulmonary vascularity is not clearly engorged. S sternal pacemaker defibrillator pads are present. The esophagogastric tube tip in proximal port project in the proximal gastric body. The endotracheal tube tip appears to be somewhat high in position approximately 2 cm above the superior margin of  the clavicular heads at approximately 8.1 cm above the carina. Known right anterior rib fractures are not demonstrated on today's study. IMPRESSION: No postprocedure complication following right internal jugular venous catheter placement. High positioning of the endotracheal tube. Advancement by at least 5 cm is recommended. Good positioning of the esophagogastric tube. Bilateral hypoinflation. Cardiomegaly without pulmonary vascular congestion. Electronically Signed   By: David  Martinique M.D.   On: 06/30/2017 06:56   Dg Chest Portable 1 View  Addendum Date: 06/30/2017   ADDENDUM REPORT: 06/30/2017 05:12 ADDENDUM: These results were called by telephone at the time of interpretation on 06/30/2017 at 2:55 am to Dr. Charlesetta Ivory , who verbally acknowledged these results. Electronically Signed   By: Fidela Salisbury M.D.   On: 06/30/2017 05:12   Result Date: 06/30/2017 CLINICAL DATA:  Intubation. EXAM: PORTABLE CHEST 1 VIEW COMPARISON:  10/28/2016 FINDINGS:  Endotracheal tube at the level of T1 vertebral body. Tortuosity of the trachea at the cervicothoracic junction. Enteric catheter descends the abdomen. Shock pads overlie the chest. There is enlargement of the mediastinum which measures 12 cm. No evidence of pneumothorax. Low lung volumes. No focal airspace consolidation. Osseous structures are without acute abnormality. Soft tissues are grossly normal. IMPRESSION: Apparent widening of the mediastinum. This may be due to portable technique and low lung volumes, however acute aortic pathology cannot be excluded. If clinically warranted, CT angiogram of the chest should be considered. Endotracheal tube at the cervicothoracic junction. Advancement by 5 cm is recommended. Bowling of the tracheal air column at the cervicothoracic junction with uncertain significance. Electronically Signed: By: Fidela Salisbury M.D. On: 06/30/2017 02:50   Ct Angio Chest/abd/pel For Dissection W And/or Wo Contrast  Result Date: 06/30/2017 CLINICAL DATA:  Initial evaluation for acute cardiac arrest. EXAM: CT ANGIOGRAPHY CHEST, ABDOMEN AND PELVIS TECHNIQUE: Multidetector CT imaging through the chest, abdomen and pelvis was performed using the standard protocol during bolus administration of intravenous contrast. Multiplanar reconstructed images and MIPs were obtained and reviewed to evaluate the vascular anatomy. CONTRAST:  136m ISOVUE-370 IOPAMIDOL (ISOVUE-370) INJECTION 76% COMPARISON:  Prior radiograph from earlier same day. FINDINGS: CTA CHEST FINDINGS Cardiovascular: Precontrast imaging through the intrathoracic aorta at demonstrates no acute abnormality. Mild scattered atheromatous plaque within the aortic arch and descending intrathoracic aorta. Postcontrast imaging demonstrates no evidence for dissection or other acute abnormality. Partially visualized great vessels within normal limits. Cardiomegaly. No pericardial effusion. Limited evaluation of the pulmonary arterial tree  grossly unremarkable. Mediastinum/Nodes: Thyroid normal. No enlarged mediastinal, hilar, or axillary lymph nodes. No mediastinal hematoma. Enteric tube in place within the esophagus. Lungs/Pleura: Endotracheal tube in place. Layering secretions within the subglottic trachea extending into the right mainstem bronchus. Deep tendon atelectasis present within the lower lobes bilaterally. Lungs are otherwise largely clear. No other focal infiltrates. No pulmonary edema or pleural effusion. No pneumothorax. No worrisome pulmonary nodule or mass. Musculoskeletal: External soft tissues demonstrate no acute abnormality. Acute nondisplaced fractures of the left anterior third through sixth ribs, which may be related to chest compressions. No other acute osseus abnormality. No worrisome lytic or blastic osseous lesions. Review of the MIP images confirms the above findings. CTA ABDOMEN AND PELVIS FINDINGS VASCULAR Aorta: Intra-abdominal Eric or neural well opacified without evidence for dissection or other acute abnormality. No aneurysm. Moderate atherosclerosis. Celiac: Stenosis of approximately 40% present at the origin of the celiac. Celiac axis and its branch vessels well opacified distally. SMA: Atheromatous plaque at the proximal SMA without flow-limiting stenosis. SMA patent to its distal  aspects. Renals: Single renal arteries present bilaterally. Atheromatous plaque about the origin of the renal artery is without high-grade stenosis. IMA: IMA patent to its distal aspect. Inflow: Iliac artery is widely patent bilaterally. Veins: No acute venous abnormality. Review of the MIP images confirms the above findings. NON-VASCULAR Hepatobiliary: The liver demonstrates no acute abnormality. Gallbladder within normal limits. No biliary dilatation. Pancreas: Pancreas within normal limits. Spleen: Spleen within normal limits. Adrenals/Urinary Tract: 2.7 cm heterogeneous right adrenal nodule, indeterminate. Adrenal glands otherwise  unremarkable. Kidneys equal in size with symmetric enhancement. Few scattered renal cysts noted bilaterally, right greater than left. No nephrolithiasis or hydronephrosis. No focal enhancing renal mass. No hydroureter. Bladder largely decompressed with a Foley catheter in place. Stomach/Bowel: Enteric tube terminates within the stomach. No evidence for bowel obstruction. Normal appendix. No acute inflammatory changes seen about the bowels. Lymphatic: No adenopathy. Reproductive: Prostate normal. Other: No free air or fluid. Small fat containing paraumbilical hernia noted. Musculoskeletal: No acute osseus abnormality. No worrisome lytic or blastic osseous lesions. Mild scoliosis noted. Review of the MIP images confirms the above findings. IMPRESSION: 1. No CT evidence for dissection or other acute aortic pathology. 2. Acute nondisplaced fractures of the left anterior third through sixth ribs, which may be related to chest compressions given history of recent CPR. 3. Endotracheal and enteric tubes in place. Layering secretions within the subglottic trachea and right mainstem bronchus. Associated bibasilar atelectasis. 4. No other acute abnormality within the chest, abdomen, and pelvis. 5. 2.7 cm heterogeneous right adrenal nodule, indeterminate. Follow-up examination with nonemergent adrenal mass protocol CT and/or MRI suggested. Electronically Signed   By: Jeannine Boga M.D.   On: 06/30/2017 05:26    EKG: Normal sinus rhythm with T-wave inversion  Weights: Filed Weights   06/30/17 0129  Weight: 102.5 kg (226 lb)     Physical Exam: Blood pressure 90/66, pulse 63, temperature (!) 96.8 F (36 C), temperature source Bladder, resp. rate (!) 21, height _0  (1.803 m), weight 102.5 kg (226 lb), SpO2 100 %. Body mass index is 31.52 kg/m. General: Well developed, well nourished,  . Head eyes ears nose throat: Normocephalic, atraumatic, sclera non-icteric, no xanthomas, nares are without discharge. No  apparent thyromegaly and/or mass  Lungs: Normal respiratory effort.  no wheezes, no rales, no rhonchi.  Heart: RRR with normal S1 S2. no murmur gallop, no rub, PMI is normal size and placement, carotid upstroke normal without bruit, jugular venous pressure is normal Abdomen: Soft, non-tender, non-distended with normoactive bowel sounds. No hepatomegaly. No rebound/guarding. No obvious abdominal masses. Abdominal aorta is normal size without bruit Extremities: No edema. no cyanosis, no clubbing, no ulcers  Peripheral : 2+ bilateral upper extremity pulses, 2+ bilateral femoral pulses, 2+ bilateral dorsal pedal pulse Neuro: Not Alert and oriented. No facial asymmetry. No focal deficit. Does not Moves all extremities spontaneously. Musculoskeletal: Normal muscle tone without kyphosis Psych:  Does not Responds to questions appropriately with a normal affect.    Assessment: 61 year old male with hypertension hyperlipidemia with acute ventricular fibrillation arrest and elevated troponin consistent with myocardial infarction  Plan: 1. Continue supportive care for pressure support and ventilatory support 2. Proceed to cardiac catheterization to assess coronary anatomy and further treatment thereof is necessary. Patient's family understands risk and benefits cardiac catheterization. This includes possibility of death stroke heart attack infection bleeding blood clot. Patient is already with conscious sedation 3. Further treatment options after above  Signed, Corey Skains M.D. [facility] 06/30/2017, 8:26 AM

## 2017-06-30 NOTE — ED Notes (Signed)
Pt intubated by MD Webster with 7.5 et tube. Positive color change and placement ausculted.

## 2017-06-30 NOTE — ED Provider Notes (Signed)
[facility] Emergency Department Provider Note   ____________________________________________   First MD Initiated Contact with Patient 06/30/17 0122     (approximate)  I have reviewed the triage vital signs and the nursing notes.   HISTORY  Chief Complaint unresponsive  Patient unresponsive and unable to give history.  HPI Jason Gross is a 61 y.o. male who was brought in by ambulance after being found unresponsive.  According to EMS the patient got up to go to the bathroom and became incontinent on himself.  The patient fell sat up and then passed out.  EMS was called and when the fire department arrived the patient had agonal respirations.  The patient was shocked twice and received 1 round of epinephrine.  EMS states that they were able to regain return of spontaneous circulation.  The patient is diaphoretic and was brought in for evaluation and treatment.  Past Medical History:  Diagnosis Date  . Allergy   . GERD (gastroesophageal reflux disease)   . Hyperlipidemia    not on meds  . Hypertension   . Vertigo     Patient Active Problem List   Diagnosis Date Noted  . Cardiac arrest ([place]) 06/30/2017  . Parotid mass 01/07/2017  . Vertigo 10/31/2016  . Routine general medical examination at a health care facility 01/15/2011  . DYSHIDROTIC ECZEMA 07/17/2009  . HYPERLIPIDEMIA 01/01/2009  . ACTINIC KERATOSIS 01/01/2009  . Essential hypertension, benign 05/29/2008  . Allergic rhinitis due to pollen 05/29/2008    Past Surgical History:  Procedure Laterality Date  . PAROTIDECTOMY Left 01/07/2017   Procedure: PAROTIDECTOMY (SUPERFICIAL);  Surgeon: Clyde Canterbury, MD;  Location: [facility] ORS;  Service: ENT;  Laterality: Left;    Prior to Admission medications   Medication Sig Start Date End Date Taking? Authorizing Provider  cetirizine (ZYRTEC) 10 MG tablet Take 10 mg by mouth daily.     Yes [provider]  ibuprofen (ADVIL,MOTRIN) 200 MG  tablet Take 200 mg by mouth every 6 (six) hours as needed.   Yes [provider]  lisinopril-hydrochlorothiazide (PRINZIDE,ZESTORETIC) 10-12.5 MG tablet TAKE 1 TABLET DAILY 12/12/16  Yes Venia Carbon, MD    Allergies Patient has no known allergies.  Family History  Problem Relation Age of Onset  . Healthy Mother   . Coronary artery disease Father   . Diabetes Sister   . Diabetes Maternal Uncle   . Coronary artery disease Maternal Grandfather   . Diabetes Maternal Grandfather   . Esophageal cancer Paternal Uncle   . Hypertension Neg Hx   . Cancer Neg Hx   . Colon cancer Neg Hx   . Colon polyps Neg Hx   . Rectal cancer Neg Hx   . Stomach cancer Neg Hx     Social History Social History   Tobacco Use  . Smoking status: Former Smoker    Last attempt to quit: 07/28/1968    Years since quitting: 48.9  . Smokeless tobacco: Never Used  Substance Use Topics  . Alcohol use: No    Comment: occasional  . Drug use: No    Review of Systems  Unable to assess due to patient unresponsiveness  ____________________________________________   PHYSICAL EXAM:  VITAL SIGNS: ED Triage Vitals  Enc Vitals Group     BP 06/30/17 0128 (!) 182/120     Pulse Rate 06/30/17 0128 (!) 111     Resp 06/30/17 0128 14     Temp 06/30/17 0136 (!) 92.8 F (33.8 C)  Temp Source 06/30/17 0136 Bladder     SpO2 06/30/17 0128 98 %     Weight 06/30/17 0129 226 lb (102.5 kg)     Height 06/30/17 0129 5\' 11"  (1.803 m)     Head Circumference --      Peak Flow --      Pain Score --      Pain Loc --      Pain Edu? --      Excl. in [place]? --     Constitutional: Diaphoretic and unresponsive Eyes: Conjunctivae are normal. PERRL. EOMI. Head: Atraumatic. Nose: No congestion/rhinnorhea. Mouth/Throat: Mucous membranes are moist.  Oropharynx non-erythematous. Cardiovascular: Tachycardia regular rhythm. Grossly normal heart sounds.  Good peripheral circulation. Respiratory: Agonal respirations and  sonorous respirations, coarse rhonchorous breath sounds Gastrointestinal: Soft and nontender. No distention.  Musculoskeletal: No lower extremity  edema.   Neurologic: Unresponsive with agonal respirations Skin: Chin is cool, pale and diaphoretic Psychiatric: Patient is unresponsive  ____________________________________________   LABS (all labs ordered are listed, but only abnormal results are displayed)  Labs Reviewed  CBC - Abnormal; Notable for the following components:      Result Value   WBC 17.6 (*)    All other components within normal limits  COMPREHENSIVE METABOLIC PANEL - Abnormal; Notable for the following components:   Potassium 2.9 (*)    Chloride 100 (*)    CO2 19 (*)    Glucose, Bld 245 (*)    Calcium 8.6 (*)    AST 74 (*)    ALT 68 (*)    Anion gap 18 (*)    All other components within normal limits  TROPONIN I - Abnormal; Notable for the following components:   Troponin I 0.09 (*)    All other components within normal limits  LACTIC ACID, PLASMA - Abnormal; Notable for the following components:   Lactic Acid, Venous 5.0 (*)    All other components within normal limits  BLOOD GAS, ARTERIAL - Abnormal; Notable for the following components:   pH, Arterial 7.28 (*)    Acid-base deficit 6.4 (*)    All other components within normal limits  URINALYSIS, COMPLETE (UACMP) WITH MICROSCOPIC - Abnormal; Notable for the following components:   Color, Urine STRAW (*)    APPearance HAZY (*)    Glucose, UA >=500 (*)    Hgb urine dipstick SMALL (*)    Protein, ur 100 (*)    All other components within normal limits  LACTIC ACID, PLASMA  TSH  TRIGLYCERIDES  PROCALCITONIN  PROTIME-INR  CBC  COMPREHENSIVE METABOLIC PANEL  MAGNESIUM  PHOSPHORUS  TROPONIN I  TROPONIN I  TROPONIN I  APTT   ____________________________________________  EKG  ED ECG REPORT I, Loney Hering, the attending physician, personally viewed and interpreted this ECG.   Date:  06/30/2017  EKG Time: 209  Rate: 101  Rhythm: normal sinus rhythm, sinus tachycardia  Axis: left axis deviation  Intervals:prolonged qtc  ST&T Change: none  ____________________________________________  RADIOLOGY  Dg Abdomen 1 View  Result Date: 06/30/2017 CLINICAL DATA:  Orogastric tube placement. EXAM: ABDOMEN - 1 VIEW COMPARISON:  None. FINDINGS: The bowel gas pattern is normal. Orogastric tube at the expected location of gastric body. Shock pads overlies the lower thorax/upper abdomen. IMPRESSION: Orogastric tube at the expected location of gastric body. Electronically Signed   By: Fidela Salisbury M.D.   On: 06/30/2017 02:52   Ct Head Wo Contrast  Result Date: 06/30/2017 CLINICAL DATA:  Found unresponsive.  EXAM: CT HEAD WITHOUT CONTRAST CT CERVICAL SPINE WITHOUT CONTRAST TECHNIQUE: Multidetector CT imaging of the head and cervical spine was performed following the standard protocol without intravenous contrast. Multiplanar CT image reconstructions of the cervical spine were also generated. COMPARISON:  None. FINDINGS: CT HEAD FINDINGS Brain: No evidence of acute infarction, hemorrhage, hydrocephalus, extra-axial collection or mass lesion/mass effect. Vascular: No hyperdense vessel or unexpected calcification. Calcific atherosclerotic disease of the intracavernous internal carotid arteries. Skull: Normal. Negative for fracture or focal lesion. Sinuses/Orbits: No acute finding. Other: None. CT CERVICAL SPINE FINDINGS Alignment: Stranding of the cervical lordosis, likely position. Skull base and vertebrae: No acute fracture. No primary bone lesion or focal pathologic process. Soft tissues and spinal canal: No prevertebral fluid or swelling. No visible canal hematoma. Disc levels: Mild multilevel osteoarthritic changes of the cervical spine. Upper chest: Post intubation and placement of enteric catheter. Debris within the trachea. Airspace consolidation in the visualized posterior aspect of the  lung parenchyma, possibly due to aspiration. Other: None. IMPRESSION: No acute intracranial abnormality. No evidence of acute traumatic injury to cervical spine. Post intubation and placement of enteric catheter. Debris within the trachea. Airspace consolidation in the visualized posterior aspect of the lung parenchyma, possibly due to aspiration. Electronically Signed   By: Fidela Salisbury M.D.   On: 06/30/2017 03:03   Ct Cervical Spine Wo Contrast  Result Date: 06/30/2017 CLINICAL DATA:  Found unresponsive. EXAM: CT HEAD WITHOUT CONTRAST CT CERVICAL SPINE WITHOUT CONTRAST TECHNIQUE: Multidetector CT imaging of the head and cervical spine was performed following the standard protocol without intravenous contrast. Multiplanar CT image reconstructions of the cervical spine were also generated. COMPARISON:  None. FINDINGS: CT HEAD FINDINGS Brain: No evidence of acute infarction, hemorrhage, hydrocephalus, extra-axial collection or mass lesion/mass effect. Vascular: No hyperdense vessel or unexpected calcification. Calcific atherosclerotic disease of the intracavernous internal carotid arteries. Skull: Normal. Negative for fracture or focal lesion. Sinuses/Orbits: No acute finding. Other: None. CT CERVICAL SPINE FINDINGS Alignment: Stranding of the cervical lordosis, likely position. Skull base and vertebrae: No acute fracture. No primary bone lesion or focal pathologic process. Soft tissues and spinal canal: No prevertebral fluid or swelling. No visible canal hematoma. Disc levels: Mild multilevel osteoarthritic changes of the cervical spine. Upper chest: Post intubation and placement of enteric catheter. Debris within the trachea. Airspace consolidation in the visualized posterior aspect of the lung parenchyma, possibly due to aspiration. Other: None. IMPRESSION: No acute intracranial abnormality. No evidence of acute traumatic injury to cervical spine. Post intubation and placement of enteric catheter.  Debris within the trachea. Airspace consolidation in the visualized posterior aspect of the lung parenchyma, possibly due to aspiration. Electronically Signed   By: Fidela Salisbury M.D.   On: 06/30/2017 03:03   Dg Chest Portable 1 View  Result Date: 06/30/2017 CLINICAL DATA:  Intubation. EXAM: PORTABLE CHEST 1 VIEW COMPARISON:  10/28/2016 FINDINGS: Endotracheal tube at the level of T1 vertebral body. Tortuosity of the trachea at the cervicothoracic junction. Enteric catheter descends the abdomen. Shock pads overlie the chest. There is enlargement of the mediastinum which measures 12 cm. No evidence of pneumothorax. Low lung volumes. No focal airspace consolidation. Osseous structures are without acute abnormality. Soft tissues are grossly normal. IMPRESSION: Apparent widening of the mediastinum. This may be due to portable technique and low lung volumes, however acute aortic pathology cannot be excluded. If clinically warranted, CT angiogram of the chest should be considered. Endotracheal tube at the cervicothoracic junction. Advancement by 5 cm is recommended.  Bowling of the tracheal air column at the cervicothoracic junction with uncertain significance. Electronically Signed   By: Fidela Salisbury M.D.   On: 06/30/2017 02:50    ____________________________________________   PROCEDURES  Procedure(s) performed: please, see procedure note(s).  Procedure Name: Intubation Date/Time: 06/30/2017 1:30 AM Performed by: Loney Hering, MD Pre-anesthesia Checklist: Patient identified Oxygen Delivery Method: Ambu bag Preoxygenation: Pre-oxygenation with 100% oxygen Induction Type: Rapid sequence Ventilation: Mask ventilation without difficulty Laryngoscope Size: Glidescope and 4 Tube size: 7.5 mm Number of attempts: 1 Airway Equipment and Method: Rigid stylet Placement Confirmation: ETT inserted through vocal cords under direct vision,  Positive ETCO2,  CO2 detector and Breath sounds  checked- equal and bilateral Secured at: 22 cm Tube secured with: ETT holder Difficulty Due To: Difficulty was unanticipated    .Critical Care Performed by: Loney Hering, MD Authorized by: Loney Hering, MD   Critical care provider statement:    Critical care time (minutes):  45   Critical care start time:  06/30/2017 1:22 AM   Critical care end time:  06/30/2017 2:15 AM   Critical care time was exclusive of:  Separately billable procedures and treating other patients   Critical care was necessary to treat or prevent imminent or life-threatening deterioration of the following conditions:  Cardiac failure and respiratory failure   Critical care was time spent personally by me on the following activities:  Discussions with consultants, examination of patient, obtaining history from patient or surrogate, ordering and performing treatments and interventions, ordering and review of laboratory studies, ordering and review of radiographic studies, pulse oximetry, re-evaluation of patient's condition and review of old charts    Critical Care performed: Yes, see critical care note(s)  ____________________________________________   INITIAL IMPRESSION / ASSESSMENT AND PLAN / ED COURSE  As part of my medical decision making, I reviewed the following data within the electronic MEDICAL RECORD NUMBER Notes from prior ED visits and [place] Controlled Substance Database  This is a 61 year old male who comes into the hospital today after an unresponsive episode and cardiac arrest.  The patient did have pulses when he arrived back to the emergency department.  My initial concern was that he may have hit his head given the story of a fall.  We sent him for a CT scan of his head and cervical spine which were both unremarkable.  The patient's family arrived and I did spoke with his wife.  She states that the patient did develop some heartburn symptoms which is what prompted him to get out of bed and then he  collapsed.  The patient's EKG did not show any ST segment elevation.  His chest x-ray showed a widened mediastinum but we did send the patient for a CT scan as well.  I did contact the admitting physician to admit the patient to the hospitalist service.  We did decide to cool the patient given again his cardiac arrest and return of spontaneous circulation.  The patient was found to have a lactic acidosis and an elevated white blood cell count but otherwise his blood work was unremarkable.  His bicarb was slightly low and his AST and ALT were elevated.  The patient's troponin was also elevated but again this may be due to the fact that the patient underwent CPR.  He will be admitted to the hospitalist service in the intensive care unit.      ____________________________________________   FINAL CLINICAL IMPRESSION(S) / ED DIAGNOSES  Final diagnoses:  Cardiac arrest (  [place])  Unresponsive  Syncope, unspecified syncope type     ED Discharge Orders    None       Note:  This document was prepared using Dragon voice recognition software and may include unintentional dictation errors.    Loney Hering, MD 06/30/17 [phone]

## 2017-06-30 NOTE — Progress Notes (Signed)
Pt transported to CT scan and back w/o difficulty

## 2017-06-30 NOTE — Progress Notes (Signed)
ANTICOAGULATION CONSULT NOTE - Initial Consult  Pharmacy Consult for heparin Indication: chest pain/ACS s/p cardiac arrest  No Known Allergies  Patient Measurements: Height: _0  (180.3 cm) Weight: 226 lb (102.5 kg) IBW/kg (Calculated) : 75.3 Heparin Dosing Weight: 97 kg  Vital Signs: Temp: 97.5 F (36.4 C) (12/04 0600) Temp Source: Rectal (12/04 0432) BP: 91/64 (12/04 0600) Pulse Rate: 68 (12/04 0600)  Labs: Recent Labs    06/30/17 0126 06/30/17 0600  HGB 15.0 13.7  HCT 44.2 39.5*  PLT 282 241  APTT  --  27  LABPROT  --  14.4  INR  --  1.13  CREATININE 1.22 1.04  0.99  TROPONINI 0.09* 4.92*    Estimated Creatinine Clearance: 96.7 mL/min (by C-G formula based on SCr of 0.99 mg/dL).   Medical History: Past Medical History:  Diagnosis Date  . Allergy   . Benign mass of parotid gland   . GERD (gastroesophageal reflux disease)   . Hyperlipidemia    not on meds  . Hypertension   . Vertigo     Medications:  Scheduled:  . chlorhexidine gluconate (MEDLINE KIT)  15 mL Mouth Rinse BID  . enoxaparin (LOVENOX) injection  40 mg Subcutaneous Q24H  . fentaNYL (SUBLIMAZE) injection  50 mcg Intravenous Once  . heparin  4,000 Units Intravenous Once  . insulin aspart  0-15 Units Subcutaneous Q4H  . ipratropium-albuterol  3 mL Nebulization Q6H  . mouth rinse  15 mL Mouth Rinse QID  . pantoprazole (PROTONIX) IV  40 mg Intravenous Daily    Assessment: Patient admitted for cardiac arrest trops up to 4.92. Patient is being started on heparin drip  Goal of Therapy:  Heparin level 0.3-0.7 units/ml Monitor platelets by anticoagulation protocol: Yes   Plan:  Will bolus w/ heparin 4000 units IV x 1 Will start drip @ 1100 units/hr  Will check an anti-Xa @ 1400. Baseline labs WNL Will monitor daily CBCs and will adjust per anti-Xa's  Tobie Lords, PharmD, BCPS Clinical Pharmacist 06/30/2017

## 2017-06-30 NOTE — H&P (Addendum)
Jason Gross is an 61 y.o. male.   Chief Complaint: Unresponsive episode HPI: The patient with past medical history of hypertension presents to the emergency department after collapsing and becoming unresponsive.  The episode was witnessed.  The patient awoke from sleep complaining of heartburn.  He was walking toward the bathroom when his wife saw him collapse.  She quickly approached him and found him to respond incoherently before losing consciousness.  EMS initiated CPR.  He received 2 shocks in the field.  The patient had a pulse upon arrival to the emergency department.  He was intubated placed on mechanical ventilation prior to the emergency department staff calling the hospitalist service for admission.  Past Medical History:  Diagnosis Date  . Allergy   . Benign mass of parotid gland   . GERD (gastroesophageal reflux disease)   . Hyperlipidemia    not on meds  . Hypertension   . Vertigo     Past Surgical History:  Procedure Laterality Date  . PAROTIDECTOMY Left 01/07/2017   Procedure: PAROTIDECTOMY (SUPERFICIAL);  Surgeon: Clyde Canterbury, MD;  Location: [facility] ORS;  Service: ENT;  Laterality: Left;    Family History  Problem Relation Age of Onset  . Healthy Mother   . Coronary artery disease Father   . Diabetes Sister   . Diabetes Maternal Uncle   . Coronary artery disease Maternal Grandfather   . Diabetes Maternal Grandfather   . Esophageal cancer Paternal Uncle   . Hypertension Neg Hx   . Cancer Neg Hx   . Colon cancer Neg Hx   . Colon polyps Neg Hx   . Rectal cancer Neg Hx   . Stomach cancer Neg Hx    Social History:  reports that he quit smoking about 48 years ago. he has never used smokeless tobacco. He reports that he does not drink alcohol or use drugs.  Allergies: No Known Allergies  Medications Prior to Admission  Medication Sig Dispense Refill  . cetirizine (ZYRTEC) 10 MG tablet Take 10 mg by mouth daily.      Marland Kitchen ibuprofen (ADVIL,MOTRIN) 200 MG tablet Take  200 mg by mouth every 6 (six) hours as needed.    Marland Kitchen lisinopril-hydrochlorothiazide (PRINZIDE,ZESTORETIC) 10-12.5 MG tablet TAKE 1 TABLET DAILY 90 tablet 3    Results for orders placed or performed during the hospital encounter of 06/30/17 (from the past 48 hour(s))  CBC     Status: Abnormal   Collection Time: 06/30/17  1:26 AM  Result Value Ref Range   WBC 17.6 (H) 3.8 - 10.6 K/uL   RBC 5.17 4.40 - 5.90 MIL/uL   Hemoglobin 15.0 13.0 - 18.0 g/dL   HCT 44.2 40.0 - 52.0 %   MCV 85.5 80.0 - 100.0 fL   MCH 29.0 26.0 - 34.0 pg   MCHC 33.9 32.0 - 36.0 g/dL   RDW 13.7 11.5 - 14.5 %   Platelets 282 150 - 440 K/uL  Comprehensive metabolic panel     Status: Abnormal   Collection Time: 06/30/17  1:26 AM  Result Value Ref Range   Sodium 137 135 - 145 mmol/L   Potassium 2.9 (L) 3.5 - 5.1 mmol/L   Chloride 100 (L) 101 - 111 mmol/L   CO2 19 (L) 22 - 32 mmol/L   Glucose, Bld 245 (H) 65 - 99 mg/dL   BUN 11 6 - 20 mg/dL   Creatinine, Ser 1.22 0.61 - 1.24 mg/dL   Calcium 8.6 (L) 8.9 - 10.3 mg/dL   Total  Protein 6.7 6.5 - 8.1 g/dL   Albumin 3.7 3.5 - 5.0 g/dL   AST 74 (H) 15 - 41 U/L   ALT 68 (H) 17 - 63 U/L   Alkaline Phosphatase 51 38 - 126 U/L   Total Bilirubin 1.0 0.3 - 1.2 mg/dL   GFR calc non Af Amer >60 >60 mL/min   GFR calc Af Amer >60 >60 mL/min    Comment: (NOTE) The eGFR has been calculated using the CKD EPI equation. This calculation has not been validated in all clinical situations. eGFR's persistently <60 mL/min signify possible Chronic Kidney Disease.    Anion gap 18 (H) 5 - 15  Troponin I     Status: Abnormal   Collection Time: 06/30/17  1:26 AM  Result Value Ref Range   Troponin I 0.09 (HH) <0.03 ng/mL    Comment: CRITICAL RESULT CALLED TO, READ BACK BY AND VERIFIED WITH KAILEY WALKER ON 06/30/17 AT 0203 JAG   Lactic acid, plasma     Status: Abnormal   Collection Time: 06/30/17  2:00 AM  Result Value Ref Range   Lactic Acid, Venous 5.0 (HH) 0.5 - 1.9 mmol/L     Comment: CRITICAL RESULT CALLED TO, READ BACK BY AND VERIFIED WITH KAILEY WALKER ON 06/30/17 AT 0254 JAG   Blood gas, arterial ([facility] & [facility] ONLY)     Status: Abnormal   Collection Time: 06/30/17  2:00 AM  Result Value Ref Range   FIO2 1.00    Delivery systems VENTILATOR    VT 500 mL   LHR 14 resp/min   Peep/cpap 5.0 cm H20   pH, Arterial 7.28 (L) 7.350 - 7.450   pCO2 arterial 43 32.0 - 48.0 mmHg   pO2, Arterial 92 83.0 - 108.0 mmHg   Bicarbonate 20.2 20.0 - 28.0 mmol/L   Acid-base deficit 6.4 (H) 0.0 - 2.0 mmol/L   O2 Saturation 96.0 %   Patient temperature 37.0    Collection site RIGHT RADIAL    Sample type ARTERIAL DRAW    Allens test (pass/fail) PASS PASS   Mechanical Rate 14   Urinalysis, Complete w Microscopic     Status: Abnormal   Collection Time: 06/30/17  2:00 AM  Result Value Ref Range   Color, Urine STRAW (A) YELLOW   APPearance HAZY (A) CLEAR   Specific Gravity, Urine 1.006 1.005 - 1.030   pH 7.0 5.0 - 8.0   Glucose, UA >=500 (A) NEGATIVE mg/dL   Hgb urine dipstick SMALL (A) NEGATIVE   Bilirubin Urine NEGATIVE NEGATIVE   Ketones, ur NEGATIVE NEGATIVE mg/dL   Protein, ur 100 (A) NEGATIVE mg/dL   Nitrite NEGATIVE NEGATIVE   Leukocytes, UA NEGATIVE NEGATIVE   RBC / HPF 6-30 0 - 5 RBC/hpf   WBC, UA 6-30 0 - 5 WBC/hpf   Bacteria, UA NONE SEEN NONE SEEN   Squamous Epithelial / LPF NONE SEEN NONE SEEN   Mucus PRESENT   Glucose, capillary     Status: Abnormal   Collection Time: 06/30/17  4:59 AM  Result Value Ref Range   Glucose-Capillary 186 (H) 65 - 99 mg/dL  Lactic acid, plasma     Status: Abnormal   Collection Time: 06/30/17  6:00 AM  Result Value Ref Range   Lactic Acid, Venous 2.0 (HH) 0.5 - 1.9 mmol/L    Comment: CRITICAL RESULT CALLED TO, READ BACK BY AND VERIFIED WITH CHELSEY WRENN ON 06/30/17 AT 0643 JAG   Triglycerides     Status: Abnormal  Collection Time: 06/30/17  6:00 AM  Result Value Ref Range   Triglycerides 168 (H) <150 mg/dL  Protime-INR      Status: None   Collection Time: 06/30/17  6:00 AM  Result Value Ref Range   Prothrombin Time 14.4 11.4 - 15.2 seconds   INR 1.13   CBC     Status: Abnormal   Collection Time: 06/30/17  6:00 AM  Result Value Ref Range   WBC 17.7 (H) 3.8 - 10.6 K/uL   RBC 4.65 4.40 - 5.90 MIL/uL   Hemoglobin 13.7 13.0 - 18.0 g/dL   HCT 39.5 (L) 40.0 - 52.0 %   MCV 84.9 80.0 - 100.0 fL   MCH 29.5 26.0 - 34.0 pg   MCHC 34.8 32.0 - 36.0 g/dL   RDW 13.7 11.5 - 14.5 %   Platelets 241 150 - 440 K/uL  Comprehensive metabolic panel     Status: Abnormal   Collection Time: 06/30/17  6:00 AM  Result Value Ref Range   Sodium 136 135 - 145 mmol/L   Potassium 4.3 3.5 - 5.1 mmol/L   Chloride 108 101 - 111 mmol/L   CO2 23 22 - 32 mmol/L   Glucose, Bld 171 (H) 65 - 99 mg/dL   BUN 14 6 - 20 mg/dL   Creatinine, Ser 0.99 0.61 - 1.24 mg/dL   Calcium 7.3 (L) 8.9 - 10.3 mg/dL   Total Protein 5.8 (L) 6.5 - 8.1 g/dL   Albumin 3.2 (L) 3.5 - 5.0 g/dL   AST 69 (H) 15 - 41 U/L   ALT 66 (H) 17 - 63 U/L   Alkaline Phosphatase 36 (L) 38 - 126 U/L   Total Bilirubin 0.7 0.3 - 1.2 mg/dL   GFR calc non Af Amer >60 >60 mL/min   GFR calc Af Amer >60 >60 mL/min    Comment: (NOTE) The eGFR has been calculated using the CKD EPI equation. This calculation has not been validated in all clinical situations. eGFR's persistently <60 mL/min signify possible Chronic Kidney Disease.    Anion gap 5 5 - 15  Magnesium     Status: Abnormal   Collection Time: 06/30/17  6:00 AM  Result Value Ref Range   Magnesium 1.6 (L) 1.7 - 2.4 mg/dL  Phosphorus     Status: None   Collection Time: 06/30/17  6:00 AM  Result Value Ref Range   Phosphorus 3.3 2.5 - 4.6 mg/dL  APTT     Status: None   Collection Time: 06/30/17  6:00 AM  Result Value Ref Range   aPTT 27 24 - 36 seconds  Lipid panel     Status: Abnormal   Collection Time: 06/30/17  6:00 AM  Result Value Ref Range   Cholesterol 153 0 - 200 mg/dL   Triglycerides 170 (H) <150 mg/dL   HDL  28 (L) >40 mg/dL   Total CHOL/HDL Ratio 5.5 RATIO   VLDL 34 0 - 40 mg/dL   LDL Cholesterol 91 0 - 99 mg/dL    Comment:        Total Cholesterol/HDL:CHD Risk Coronary Heart Disease Risk Table                     Men   Women  1/2 Average Risk   3.4   3.3  Average Risk       5.0   4.4  2 X Average Risk   9.6   7.1  3 X Average Risk  23.4  11.0        Use the calculated Patient Ratio above and the CHD Risk Table to determine the patient's CHD Risk.        ATP III CLASSIFICATION (LDL):  <100     mg/dL   Optimal  100-129  mg/dL   Near or Above                    Optimal  130-159  mg/dL   Borderline  160-189  mg/dL   High  >190     mg/dL   Very High   Troponin I     Status: Abnormal   Collection Time: 06/30/17  6:00 AM  Result Value Ref Range   Troponin I 4.92 (HH) <0.03 ng/mL    Comment: CRITICAL RESULT CALLED TO, READ BACK BY AND VERIFIED WITH  JENNIE SMITH AT 7482 06/30/17 SDR   Creatinine, serum     Status: None   Collection Time: 06/30/17  6:00 AM  Result Value Ref Range   Creatinine, Ser 1.04 0.61 - 1.24 mg/dL   GFR calc non Af Amer >60 >60 mL/min   GFR calc Af Amer >60 >60 mL/min    Comment: (NOTE) The eGFR has been calculated using the CKD EPI equation. This calculation has not been validated in all clinical situations. eGFR's persistently <60 mL/min signify possible Chronic Kidney Disease.   Blood gas, arterial     Status: Abnormal   Collection Time: 06/30/17  6:10 AM  Result Value Ref Range   FIO2 1.00    Delivery systems VENTILATOR    Mode PRESSURE REGULATED VOLUME CONTROL    VT 500 mL   LHR 14 resp/min   Peep/cpap 5.0 cm H20   pH, Arterial 7.25 (L) 7.350 - 7.450   pCO2 arterial 54 (H) 32.0 - 48.0 mmHg   pO2, Arterial 233 (H) 83.0 - 108.0 mmHg   Bicarbonate 23.1 20.0 - 28.0 mmol/L   Acid-base deficit 5.0 (H) 0.0 - 2.0 mmol/L   O2 Saturation 99.8 %   Patient temperature 36.0    Collection site A-LINE    Sample type ARTERIAL DRAW    Mechanical Rate 14     Dg Abdomen 1 View  Result Date: 06/30/2017 CLINICAL DATA:  Orogastric tube placement. EXAM: ABDOMEN - 1 VIEW COMPARISON:  None. FINDINGS: The bowel gas pattern is normal. Orogastric tube at the expected location of gastric body. Shock pads overlies the lower thorax/upper abdomen. IMPRESSION: Orogastric tube at the expected location of gastric body. Electronically Signed   By: Fidela Salisbury M.D.   On: 06/30/2017 02:52   Ct Head Wo Contrast  Result Date: 06/30/2017 CLINICAL DATA:  Found unresponsive. EXAM: CT HEAD WITHOUT CONTRAST CT CERVICAL SPINE WITHOUT CONTRAST TECHNIQUE: Multidetector CT imaging of the head and cervical spine was performed following the standard protocol without intravenous contrast. Multiplanar CT image reconstructions of the cervical spine were also generated. COMPARISON:  None. FINDINGS: CT HEAD FINDINGS Brain: No evidence of acute infarction, hemorrhage, hydrocephalus, extra-axial collection or mass lesion/mass effect. Vascular: No hyperdense vessel or unexpected calcification. Calcific atherosclerotic disease of the intracavernous internal carotid arteries. Skull: Normal. Negative for fracture or focal lesion. Sinuses/Orbits: No acute finding. Other: None. CT CERVICAL SPINE FINDINGS Alignment: Stranding of the cervical lordosis, likely position. Skull base and vertebrae: No acute fracture. No primary bone lesion or focal pathologic process. Soft tissues and spinal canal: No prevertebral fluid or swelling. No visible canal hematoma. Disc levels: Mild multilevel osteoarthritic changes of  the cervical spine. Upper chest: Post intubation and placement of enteric catheter. Debris within the trachea. Airspace consolidation in the visualized posterior aspect of the lung parenchyma, possibly due to aspiration. Other: None. IMPRESSION: No acute intracranial abnormality. No evidence of acute traumatic injury to cervical spine. Post intubation and placement of enteric catheter. Debris  within the trachea. Airspace consolidation in the visualized posterior aspect of the lung parenchyma, possibly due to aspiration. Electronically Signed   By: Fidela Salisbury M.D.   On: 06/30/2017 03:03   Ct Cervical Spine Wo Contrast  Result Date: 06/30/2017 CLINICAL DATA:  Found unresponsive. EXAM: CT HEAD WITHOUT CONTRAST CT CERVICAL SPINE WITHOUT CONTRAST TECHNIQUE: Multidetector CT imaging of the head and cervical spine was performed following the standard protocol without intravenous contrast. Multiplanar CT image reconstructions of the cervical spine were also generated. COMPARISON:  None. FINDINGS: CT HEAD FINDINGS Brain: No evidence of acute infarction, hemorrhage, hydrocephalus, extra-axial collection or mass lesion/mass effect. Vascular: No hyperdense vessel or unexpected calcification. Calcific atherosclerotic disease of the intracavernous internal carotid arteries. Skull: Normal. Negative for fracture or focal lesion. Sinuses/Orbits: No acute finding. Other: None. CT CERVICAL SPINE FINDINGS Alignment: Stranding of the cervical lordosis, likely position. Skull base and vertebrae: No acute fracture. No primary bone lesion or focal pathologic process. Soft tissues and spinal canal: No prevertebral fluid or swelling. No visible canal hematoma. Disc levels: Mild multilevel osteoarthritic changes of the cervical spine. Upper chest: Post intubation and placement of enteric catheter. Debris within the trachea. Airspace consolidation in the visualized posterior aspect of the lung parenchyma, possibly due to aspiration. Other: None. IMPRESSION: No acute intracranial abnormality. No evidence of acute traumatic injury to cervical spine. Post intubation and placement of enteric catheter. Debris within the trachea. Airspace consolidation in the visualized posterior aspect of the lung parenchyma, possibly due to aspiration. Electronically Signed   By: Fidela Salisbury M.D.   On: 06/30/2017 03:03   Dg Chest  Port 1 View  Result Date: 06/30/2017 CLINICAL DATA:  Status post central line placement EXAM: PORTABLE CHEST 1 VIEW COMPARISON:  Chest x-ray and chest CT scan of earlier today FINDINGS: The lungs remain hypoinflated. The right internal jugular venous catheter tip projects over the junction of the proximal and middle thirds of the SVC. No postprocedure pneumothorax is observed. The cardiac silhouette is enlarged. The pulmonary vascularity is not clearly engorged. S sternal pacemaker defibrillator pads are present. The esophagogastric tube tip in proximal port project in the proximal gastric body. The endotracheal tube tip appears to be somewhat high in position approximately 2 cm above the superior margin of the clavicular heads at approximately 8.1 cm above the carina. Known right anterior rib fractures are not demonstrated on today's study. IMPRESSION: No postprocedure complication following right internal jugular venous catheter placement. High positioning of the endotracheal tube. Advancement by at least 5 cm is recommended. Good positioning of the esophagogastric tube. Bilateral hypoinflation. Cardiomegaly without pulmonary vascular congestion. Electronically Signed   By: David  Martinique M.D.   On: 06/30/2017 06:56   Dg Chest Portable 1 View  Addendum Date: 06/30/2017   ADDENDUM REPORT: 06/30/2017 05:12 ADDENDUM: These results were called by telephone at the time of interpretation on 06/30/2017 at 2:55 am to Dr. Charlesetta Ivory , who verbally acknowledged these results. Electronically Signed   By: Fidela Salisbury M.D.   On: 06/30/2017 05:12   Result Date: 06/30/2017 CLINICAL DATA:  Intubation. EXAM: PORTABLE CHEST 1 VIEW COMPARISON:  10/28/2016 FINDINGS: Endotracheal tube at the  level of T1 vertebral body. Tortuosity of the trachea at the cervicothoracic junction. Enteric catheter descends the abdomen. Shock pads overlie the chest. There is enlargement of the mediastinum which measures 12 cm. No  evidence of pneumothorax. Low lung volumes. No focal airspace consolidation. Osseous structures are without acute abnormality. Soft tissues are grossly normal. IMPRESSION: Apparent widening of the mediastinum. This may be due to portable technique and low lung volumes, however acute aortic pathology cannot be excluded. If clinically warranted, CT angiogram of the chest should be considered. Endotracheal tube at the cervicothoracic junction. Advancement by 5 cm is recommended. Bowling of the tracheal air column at the cervicothoracic junction with uncertain significance. Electronically Signed: By: Fidela Salisbury M.D. On: 06/30/2017 02:50   Ct Angio Chest/abd/pel For Dissection W And/or Wo Contrast  Result Date: 06/30/2017 CLINICAL DATA:  Initial evaluation for acute cardiac arrest. EXAM: CT ANGIOGRAPHY CHEST, ABDOMEN AND PELVIS TECHNIQUE: Multidetector CT imaging through the chest, abdomen and pelvis was performed using the standard protocol during bolus administration of intravenous contrast. Multiplanar reconstructed images and MIPs were obtained and reviewed to evaluate the vascular anatomy. CONTRAST:  147m ISOVUE-370 IOPAMIDOL (ISOVUE-370) INJECTION 76% COMPARISON:  Prior radiograph from earlier same day. FINDINGS: CTA CHEST FINDINGS Cardiovascular: Precontrast imaging through the intrathoracic aorta at demonstrates no acute abnormality. Mild scattered atheromatous plaque within the aortic arch and descending intrathoracic aorta. Postcontrast imaging demonstrates no evidence for dissection or other acute abnormality. Partially visualized great vessels within normal limits. Cardiomegaly. No pericardial effusion. Limited evaluation of the pulmonary arterial tree grossly unremarkable. Mediastinum/Nodes: Thyroid normal. No enlarged mediastinal, hilar, or axillary lymph nodes. No mediastinal hematoma. Enteric tube in place within the esophagus. Lungs/Pleura: Endotracheal tube in place. Layering secretions  within the subglottic trachea extending into the right mainstem bronchus. Deep tendon atelectasis present within the lower lobes bilaterally. Lungs are otherwise largely clear. No other focal infiltrates. No pulmonary edema or pleural effusion. No pneumothorax. No worrisome pulmonary nodule or mass. Musculoskeletal: External soft tissues demonstrate no acute abnormality. Acute nondisplaced fractures of the left anterior third through sixth ribs, which may be related to chest compressions. No other acute osseus abnormality. No worrisome lytic or blastic osseous lesions. Review of the MIP images confirms the above findings. CTA ABDOMEN AND PELVIS FINDINGS VASCULAR Aorta: Intra-abdominal Eric or neural well opacified without evidence for dissection or other acute abnormality. No aneurysm. Moderate atherosclerosis. Celiac: Stenosis of approximately 40% present at the origin of the celiac. Celiac axis and its branch vessels well opacified distally. SMA: Atheromatous plaque at the proximal SMA without flow-limiting stenosis. SMA patent to its distal aspects. Renals: Single renal arteries present bilaterally. Atheromatous plaque about the origin of the renal artery is without high-grade stenosis. IMA: IMA patent to its distal aspect. Inflow: Iliac artery is widely patent bilaterally. Veins: No acute venous abnormality. Review of the MIP images confirms the above findings. NON-VASCULAR Hepatobiliary: The liver demonstrates no acute abnormality. Gallbladder within normal limits. No biliary dilatation. Pancreas: Pancreas within normal limits. Spleen: Spleen within normal limits. Adrenals/Urinary Tract: 2.7 cm heterogeneous right adrenal nodule, indeterminate. Adrenal glands otherwise unremarkable. Kidneys equal in size with symmetric enhancement. Few scattered renal cysts noted bilaterally, right greater than left. No nephrolithiasis or hydronephrosis. No focal enhancing renal mass. No hydroureter. Bladder largely  decompressed with a Foley catheter in place. Stomach/Bowel: Enteric tube terminates within the stomach. No evidence for bowel obstruction. Normal appendix. No acute inflammatory changes seen about the bowels. Lymphatic: No adenopathy. Reproductive: Prostate normal. Other:  No free air or fluid. Small fat containing paraumbilical hernia noted. Musculoskeletal: No acute osseus abnormality. No worrisome lytic or blastic osseous lesions. Mild scoliosis noted. Review of the MIP images confirms the above findings. IMPRESSION: 1. No CT evidence for dissection or other acute aortic pathology. 2. Acute nondisplaced fractures of the left anterior third through sixth ribs, which may be related to chest compressions given history of recent CPR. 3. Endotracheal and enteric tubes in place. Layering secretions within the subglottic trachea and right mainstem bronchus. Associated bibasilar atelectasis. 4. No other acute abnormality within the chest, abdomen, and pelvis. 5. 2.7 cm heterogeneous right adrenal nodule, indeterminate. Follow-up examination with nonemergent adrenal mass protocol CT and/or MRI suggested. Electronically Signed   By: Jeannine Boga M.D.   On: 06/30/2017 05:26    Review of Systems  Unable to perform ROS: Critical illness    Blood pressure 91/64, pulse 68, temperature (!) 97.5 F (36.4 C), resp. rate 13, height _0  (1.803 m), weight 102.5 kg (226 lb), SpO2 100 %. Physical Exam  Vitals reviewed. Constitutional: He appears well-developed and well-nourished. No distress. He is intubated.  HENT:  Head: Normocephalic and atraumatic.  Mouth/Throat: Oropharynx is clear and moist.  Eyes: Conjunctivae and EOM are normal. Pupils are equal, round, and reactive to light. No scleral icterus.  Neck: Normal range of motion. Neck supple. No JVD present. No tracheal deviation present. No thyromegaly present.  Cardiovascular: Normal rate, regular rhythm and normal heart sounds. Exam reveals no gallop  and no friction rub.  No murmur heard. Respiratory: Breath sounds normal. He is intubated. No respiratory distress.  GI: Soft. Bowel sounds are normal. He exhibits no distension. There is no tenderness.  Genitourinary:  Genitourinary Comments: Deferred  Musculoskeletal: Normal range of motion. He exhibits no edema.  Lymphadenopathy:    He has no cervical adenopathy.  Neurological: No cranial nerve deficit.  Sedated  Skin: Skin is warm and dry. No rash noted. No erythema.  Psychiatric:  Sedated      Assessment/Plan This is a 61 year old male admitted for cardiac arrest. 1.  Cardiac arrest: Witnessed; code ice initiated.  The patient is hemodynamically stable at this time.  Continue hypothermic protocol. 2.  Acute respiratory failure: With hypoxemia; secondary to cardiac arrest.  Mental oxygen as needed.  Continue mechanical ventilation. 3.  Hypertension: Controlled; mildly hypotensive at this time.  Resume resuscitate with intravenous fluid 4.  Hyperlipidemia: Patient is not currently on medication but this likely contributes to underlying coronary artery disease.  Start statin therapy once patient is able to take p.o. meds 5.  DVT prophylaxis: Lovenox plan 6.  GI prophylaxis: None The patient is a full code.  Time spent on admission orders and critical care approximately 45 minutes.  Discussed with [facility] telemedicine.  Harrie Foreman, MD 06/30/2017, 7:30 AM

## 2017-06-30 NOTE — Progress Notes (Signed)
Patient admitted by my colleague Dr. Marcille Blanco early this morning. 61 year old male with history of hypertension, hyperlipidemia and GERD was brought in after an unresponsive episode. Received CPR and round and also shocked in the field. Intubated. -This morning he is intubated and on fentanyl, propofol for sedation. Also on low-dose Levophed for blood pressure. -Troponin seems to be elevated. -Cardiology consulted and possible cardiac catheterization today. - agree with current management and will follow along while in ICU

## 2017-06-30 NOTE — ED Notes (Signed)
Patient transported to CT 

## 2017-06-30 NOTE — Progress Notes (Signed)
Upon arrival Jason Gross was intubated, cardiac arrest. Family (wife - Butch Penny, son - Emauri, parents - strong faith, siblings, and other relatives present).  Chaplain offered listening presence and prayer with family in addition to providing emotional support in ED and to ICU. Family reports having much grief over the past five years (sevearal family members have died in this hospital).    2017/07/05 0300  Clinical Encounter Type  Visited With Patient and family together;Health care provider  Visit Type Initial;Critical Care;ED;Spiritual support  Referral From Nurse  Spiritual Encounters  Spiritual Needs Prayer;Emotional  Stress Factors  Family Stress Factors Other (Comment)

## 2017-06-30 NOTE — Progress Notes (Signed)
Initial Nutrition Assessment  DOCUMENTATION CODES:   Obesity unspecified  INTERVENTION:  Recommend initiating Vital High Protein at 20 mL/hr + Pro-Stat 60 mL QID. Provides 1280 kcal, 162 grams of protein, 403 mL H2O daily. Provides 1929 kcal with current propofol rate.  Provide liquid multivitamin with minerals daily as goal tube feed rate does not meet 100% RDIs for vitamins/minerals.  NUTRITION DIAGNOSIS:   Inadequate oral intake related to inability to eat as evidenced by NPO status.  GOAL:   Provide needs based on ASPEN/SCCM guidelines  MONITOR:   Vent status, Labs, Weight trends, TF tolerance, I & O's  REASON FOR ASSESSMENT:   Ventilator    ASSESSMENT:   61 year old male with PMHx of HTN, GERD, HLD admitted with acute NSTEMI with acute cardiac arrest s/p CPR initially by wife before EMS arrived and intubation. There is suspected anoxic brain injury and code ice was initiated.   -Patient underwent cardiac catheterization 12/4 showing mild LV systolic dysfunction and significant two-vessel CAD. LAD artery without critical stenosis. No further stenting or intervention at this time.  No family members present at time of RD assessment. Patient intubated and sedated. On 36 C cooling protocol with Nimbex ordered. Per RN plan is to start re-warming in AM. Patient appears weight stable per chart.  Access: 18 Fr. OGT placed 12/4; terminates in gastric body per abdominal x-ray 12/4; 60 cm at corner of mouth  MAP: 69-96 mmHg  Patient is currently intubated on ventilator support MV: 8.8 L/min Temp (24hrs), Avg:96.7 F (35.9 C), Min:92.8 F (33.8 C), Max:97.9 F (36.6 C)  Propofol: 24.6 ml/hr (649 kcal daily)  Medications reviewed and include: Novolog 0-9 units Q4hrs, pantoprazole, Nimbex, fentanyl gtt, LR @ 50 mL/hr, Versed gtt, Levophed gtt (off since 6789), Zosyn, propofol gtt.  Labs reviewed: CBG 105-140.  Patient does not meet criteria for malnutrition.  Discussed  with RN.  NUTRITION - FOCUSED PHYSICAL EXAM:    Most Recent Value  Orbital Region  No depletion  Upper Arm Region  No depletion  Thoracic and Lumbar Region  No depletion  Buccal Region  Unable to assess  Temple Region  No depletion  Clavicle Bone Region  No depletion  Clavicle and Acromion Bone Region  No depletion  Scapular Bone Region  Unable to assess  Dorsal Hand  No depletion  Patellar Region  No depletion  Anterior Thigh Region  No depletion  Posterior Calf Region  No depletion  Edema (RD Assessment)  Mild  Hair  Reviewed  Eyes  Unable to assess  Mouth  Unable to assess  Skin  Reviewed  Nails  Reviewed     Diet Order:  Diet NPO time specified  EDUCATION NEEDS:   No education needs have been identified at this time  Skin:  Skin Assessment: Reviewed RN Assessment  Last BM:  Unknown  Height:   Ht Readings from Last 1 Encounters:  06/30/17 5\' 11"  (1.803 m)    Weight:   Wt Readings from Last 1 Encounters:  06/30/17 226 lb (102.5 kg)    Ideal Body Weight:  78.2 kg  BMI:  Body mass index is 31.52 kg/m.  Estimated Nutritional Needs:   Kcal:  3810-1751 (11-14 kcal/kg)  Protein:  >/=156 grams (>/= 2 grams/kg IBW)  Fluid:  2.3 L/day (30 mL/kg IBW)  Willey Blade, MS, RD, LDN Office: [phone] Pager: [phone] After Hours/Weekend Pager: [phone]

## 2017-06-30 NOTE — Care Management (Signed)
Patient with no prior medical history suffered cardiac arrest at home.  Cardiac cath shows 3 vessel disease.  Code ice.

## 2017-06-30 NOTE — Consult Note (Signed)
PULMONARY / CRITICAL CARE MEDICINE   Name: Jason Gross MRN: [id number] DOB: 1956/06/01    ADMISSION DATE:  06/30/2017   CONSULTATION DATE:  06/30/2017  REFERRING MD:  Dr Dahlia Client  Reason: Cardiac arrest  HISTORY OF PRESENT ILLNESS: This is a 61 year old Caucasian male with a past medical history as indicated below who presented to the ED following a witnessed arrest at home.  History is obtained from patient's spouse as patient is currently intubated and sedated.  Per patient's wife, patient was in his usual state of health when he went to bed.  He got out of bed to go use the restroom in and fell.  His wife reports hearing a stump and when she responded she found him unresponsive on the floor with a little excoriation on his head.  She called EMS and was instructed to start chest compressions immediately.  Upon EMS arrival, she was doing chest compressions.  EMS found patient with agonal respirations and was in a shockable rhythm.  Exact rhythm unclear.  Patient was shocked twice and given 1 dose of epinephrine with return of spontaneous circulation.  He arrived the ED with BVM ventilation and an ET tube was placed in the ED.  His initial ED workup showed hyperglycemia with a blood glucose level of 245, hypokalemia with potassium level of 2.9, lactic acidosis with a lactic acid level of 5, and a WBC of 17.6K. His CT head, CT angiogram chest and abdomen are all unremarkable his portable chest x-ray showed a widened mediastinum and small right lower lobe infiltrate suggestive of aspiration. His core temperature upon arrival in the ED was 33.8 C.  His blood pressure was severely elevated at 203/131 with a heart rate of 110. Patient is restless but unable to follow commands.  PAST MEDICAL HISTORY :  He  has a past medical history of Allergy, GERD (gastroesophageal reflux disease), Hyperlipidemia, Hypertension, and Vertigo.  PAST SURGICAL HISTORY: He  has a past surgical history that includes  Parotidectomy (Left, 01/07/2017).  No Known Allergies  No current facility-administered medications on file prior to encounter.    Current Outpatient Medications on File Prior to Encounter  Medication Sig  . cetirizine (ZYRTEC) 10 MG tablet Take 10 mg by mouth daily.    Marland Kitchen ibuprofen (ADVIL,MOTRIN) 200 MG tablet Take 200 mg by mouth every 6 (six) hours as needed.  Marland Kitchen lisinopril-hydrochlorothiazide (PRINZIDE,ZESTORETIC) 10-12.5 MG tablet TAKE 1 TABLET DAILY    FAMILY HISTORY:  His indicated that his mother is alive. He indicated that his father is alive. He indicated that his sister is alive. He indicated that his brother is alive. He indicated that his maternal grandfather is deceased. He indicated that his maternal uncle is alive. He indicated that his paternal uncle is deceased. He indicated that the status of his neg hx is unknown.   SOCIAL HISTORY: He  reports that he quit smoking about 48 years ago. he has never used smokeless tobacco. He reports that he does not drink alcohol or use drugs.  REVIEW OF SYSTEMS:   Unable to obtain as patient is intubated and sedated  SUBJECTIVE:   VITAL SIGNS: BP (!) 167/119   Pulse 97   Temp (!) 96 F (35.6 C)   Resp (!) 24   Ht 5\' 11"  (1.803 m)   Wt 102.5 kg (226 lb)   SpO2 100%   BMI 31.52 kg/m   HEMODYNAMICS:    VENTILATOR SETTINGS: Vent Mode: PRVC FiO2 (%):  [100 %] 100 %  Set Rate:  [14 bmp] 14 bmp Vt Set:  [500 mL] 500 mL PEEP:  [5 cmH20] 5 cmH20  INTAKE / OUTPUT: No intake/output data recorded.  PHYSICAL EXAMINATION: General: Well-nourished, well-developed, intubated and sedated Neuro: Restless, moves all extremities, unable to follow commands, pupils are equal and reactive, got reflex intact HEENT: Neck is supple with good range of motion, no JVD conjunctivae pink Cardiovascular: Apical pulse regular, S1-S2, no murmur regurg or gallop, +2 pulses bilaterally, no edema Lungs:  Abdomen: Nondistended, normal bowel sounds in  all 4 quadrants, palpation reveals no organomegaly Musculoskeletal: No deformities, positive range of motion Skin: Warm and dry without any lesions  LABS:  BMET Recent Labs  Lab 06/30/17 0126  NA 137  K 2.9*  CL 100*  CO2 19*  BUN 11  CREATININE 1.22  GLUCOSE 245*    Electrolytes Recent Labs  Lab 06/30/17 0126  CALCIUM 8.6*    CBC Recent Labs  Lab 06/30/17 0126  WBC 17.6*  HGB 15.0  HCT 44.2  PLT 282    Coag's No results for input(s): APTT, INR in the last 168 hours.  Sepsis Markers Recent Labs  Lab 06/30/17 0200  LATICACIDVEN 5.0*    ABG Recent Labs  Lab 06/30/17 0200  PHART 7.28*  PCO2ART 43  PO2ART 92    Liver Enzymes Recent Labs  Lab 06/30/17 0126  AST 74*  ALT 68*  ALKPHOS 51  BILITOT 1.0  ALBUMIN 3.7    Cardiac Enzymes Recent Labs  Lab 06/30/17 0126  TROPONINI 0.09*    Glucose No results for input(s): GLUCAP in the last 168 hours.  Imaging Dg Abdomen 1 View  Result Date: 06/30/2017 CLINICAL DATA:  Orogastric tube placement. EXAM: ABDOMEN - 1 VIEW COMPARISON:  None. FINDINGS: The bowel gas pattern is normal. Orogastric tube at the expected location of gastric body. Shock pads overlies the lower thorax/upper abdomen. IMPRESSION: Orogastric tube at the expected location of gastric body. Electronically Signed   By: Fidela Salisbury M.D.   On: 06/30/2017 02:52   Ct Head Wo Contrast  Result Date: 06/30/2017 CLINICAL DATA:  Found unresponsive. EXAM: CT HEAD WITHOUT CONTRAST CT CERVICAL SPINE WITHOUT CONTRAST TECHNIQUE: Multidetector CT imaging of the head and cervical spine was performed following the standard protocol without intravenous contrast. Multiplanar CT image reconstructions of the cervical spine were also generated. COMPARISON:  None. FINDINGS: CT HEAD FINDINGS Brain: No evidence of acute infarction, hemorrhage, hydrocephalus, extra-axial collection or mass lesion/mass effect. Vascular: No hyperdense vessel or unexpected  calcification. Calcific atherosclerotic disease of the intracavernous internal carotid arteries. Skull: Normal. Negative for fracture or focal lesion. Sinuses/Orbits: No acute finding. Other: None. CT CERVICAL SPINE FINDINGS Alignment: Stranding of the cervical lordosis, likely position. Skull base and vertebrae: No acute fracture. No primary bone lesion or focal pathologic process. Soft tissues and spinal canal: No prevertebral fluid or swelling. No visible canal hematoma. Disc levels: Mild multilevel osteoarthritic changes of the cervical spine. Upper chest: Post intubation and placement of enteric catheter. Debris within the trachea. Airspace consolidation in the visualized posterior aspect of the lung parenchyma, possibly due to aspiration. Other: None. IMPRESSION: No acute intracranial abnormality. No evidence of acute traumatic injury to cervical spine. Post intubation and placement of enteric catheter. Debris within the trachea. Airspace consolidation in the visualized posterior aspect of the lung parenchyma, possibly due to aspiration. Electronically Signed   By: Fidela Salisbury M.D.   On: 06/30/2017 03:03   Ct Cervical Spine Wo Contrast  Result  Date: 06/30/2017 CLINICAL DATA:  Found unresponsive. EXAM: CT HEAD WITHOUT CONTRAST CT CERVICAL SPINE WITHOUT CONTRAST TECHNIQUE: Multidetector CT imaging of the head and cervical spine was performed following the standard protocol without intravenous contrast. Multiplanar CT image reconstructions of the cervical spine were also generated. COMPARISON:  None. FINDINGS: CT HEAD FINDINGS Brain: No evidence of acute infarction, hemorrhage, hydrocephalus, extra-axial collection or mass lesion/mass effect. Vascular: No hyperdense vessel or unexpected calcification. Calcific atherosclerotic disease of the intracavernous internal carotid arteries. Skull: Normal. Negative for fracture or focal lesion. Sinuses/Orbits: No acute finding. Other: None. CT CERVICAL SPINE  FINDINGS Alignment: Stranding of the cervical lordosis, likely position. Skull base and vertebrae: No acute fracture. No primary bone lesion or focal pathologic process. Soft tissues and spinal canal: No prevertebral fluid or swelling. No visible canal hematoma. Disc levels: Mild multilevel osteoarthritic changes of the cervical spine. Upper chest: Post intubation and placement of enteric catheter. Debris within the trachea. Airspace consolidation in the visualized posterior aspect of the lung parenchyma, possibly due to aspiration. Other: None. IMPRESSION: No acute intracranial abnormality. No evidence of acute traumatic injury to cervical spine. Post intubation and placement of enteric catheter. Debris within the trachea. Airspace consolidation in the visualized posterior aspect of the lung parenchyma, possibly due to aspiration. Electronically Signed   By: Fidela Salisbury M.D.   On: 06/30/2017 03:03   Dg Chest Portable 1 View  Result Date: 06/30/2017 CLINICAL DATA:  Intubation. EXAM: PORTABLE CHEST 1 VIEW COMPARISON:  10/28/2016 FINDINGS: Endotracheal tube at the level of T1 vertebral body. Tortuosity of the trachea at the cervicothoracic junction. Enteric catheter descends the abdomen. Shock pads overlie the chest. There is enlargement of the mediastinum which measures 12 cm. No evidence of pneumothorax. Low lung volumes. No focal airspace consolidation. Osseous structures are without acute abnormality. Soft tissues are grossly normal. IMPRESSION: Apparent widening of the mediastinum. This may be due to portable technique and low lung volumes, however acute aortic pathology cannot be excluded. If clinically warranted, CT angiogram of the chest should be considered. Endotracheal tube at the cervicothoracic junction. Advancement by 5 cm is recommended. Bowling of the tracheal air column at the cervicothoracic junction with uncertain significance. Electronically Signed   By: Fidela Salisbury M.D.   On:  06/30/2017 02:50    STUDIES:   2D echo pending CULTURES: Cultures x2 Urine culture Sputum culture  ANTIBIOTICS: Zosyn 12/4>  SIGNIFICANT EVENTS: 12/4>Cardiac arrest at home  LINES/TUBES: 12/4 Rt IJ 12/4 ETT 12/4 Rt radial A-line  DISCUSSION: This is a 61 year old male with a past medical history of hypertension and hyperlipidemia presenting with an out of hospital cardiac arrest possibly VT or VF arrest, acute hypoxic respiratory failure, hypokalemia and hyperglycemia without a diagnosis of diabetes.    ASSESSMENT / PLAN:  PULMONARY A: Acute hypoxic respiratory failure secondary to cardiac arrest Aspiration into airway P:   Full vent support with current settings Post intubation ABG reviewed Post intubation chest x-ray reviewed- advance ET tube by 5 cm Nebulized bronchodilators VAP protocol   CARDIOVASCULAR A:  Cardiac arrest-VF/VT arrest Cardiogenic shock History of hypertension and hyperlipidemia P:  Hemodynamic monitoring per ICU protocol IV fluids CVP monitoring every 4 hours Pressors to maintain mean arterial blood pressure greater than 65 Trend cardiac enzymes 2D echo Cardiology consulted-spoke with Dr. [name] Heparin infusion ACS protocol Targeted temperature management per protocol  RENAL A:   AKI-improved with IV fluids Hypomagnesemia Hypokalemia P:   Monitor and replace electrolytes IV fluids  GASTROINTESTINAL A:  History of GERD P:   PPI for stress ulcer prophylaxis  HEMATOLOGIC A:   No acute issues P:  DVT prophylaxis: Patient is already on full strength anticoagulation  INFECTIOUS A:   Leukocytosis Possible aspiration pneumonitis P:   Cultures as above Antibiotics as above monitor fever curve Trend pro-calcitonin  ENDOCRINE A:   Hyperglycemia with severe glucosuria without a diagnosis of type 2 diabetes P:   Blood glucose monitoring with sliding scale coverage Obtain hemoglobin A1c  NEUROLOGIC A:   Acute  encephalopathy secondary to cardiac arrest Event associated discomfort P:   RASS goal: -1 to -2 Targeted temperature management Propofol and fentanyl for sedation and vent discomfort Monitor neurological status CT head negative for any acute bleed or infarct   FAMILY  - Updates: Patient's family updated on current treatment plan, all questions answered  Vernard Gambles is a full code  - Inter-disciplinary family meet or Palliative Care meeting due by:  day 7  Candon Caras S. [facility] ANP-BC Pulmonary and Critical Care Medicine [facility] Pager [phone] or [phone]  06/30/2017, 4:43 AM

## 2017-06-30 NOTE — ED Triage Notes (Signed)
NOTE from 0017-Pt arrived via ems from home after wife hearing fall. Pt was with found with no pulses by fire department. EMS arrived and administered 1 round of epi and 2 shocks. Pt arrived to ED with pulses palpable but agonal breathing. MD and RT at bedside.

## 2017-07-01 ENCOUNTER — Encounter: Payer: Self-pay | Admitting: Internal Medicine

## 2017-07-01 ENCOUNTER — Inpatient Hospital Stay
Admit: 2017-07-01 | Discharge: 2017-07-01 | Disposition: A | Discharge status: Home / Self Care | Payer: No Typology Code available for payment source | Attending: Internal Medicine | Admitting: Internal Medicine

## 2017-07-01 ENCOUNTER — Inpatient Hospital Stay: Payer: No Typology Code available for payment source

## 2017-07-01 LAB — CBC
HEMATOCRIT: 36.2 % — AB (ref 40.0–52.0)
Hemoglobin: 12.7 g/dL — ABNORMAL LOW (ref 13.0–18.0)
MCH: 29.5 pg (ref 26.0–34.0)
MCHC: 35 g/dL (ref 32.0–36.0)
MCV: 84.3 fL (ref 80.0–100.0)
Platelets: 152 10*3/uL (ref 150–440)
RBC: 4.3 MIL/uL — ABNORMAL LOW (ref 4.40–5.90)
RDW: 13.7 % (ref 11.5–14.5)
WBC: 10 10*3/uL (ref 3.8–10.6)

## 2017-07-01 LAB — BASIC METABOLIC PANEL
ANION GAP: 10 (ref 5–15)
Anion gap: 10 (ref 5–15)
BUN: 15 mg/dL (ref 6–20)
BUN: 16 mg/dL (ref 6–20)
CALCIUM: 7.3 mg/dL — AB (ref 8.9–10.3)
CHLORIDE: 105 mmol/L (ref 101–111)
CO2: 21 mmol/L — ABNORMAL LOW (ref 22–32)
CO2: 23 mmol/L (ref 22–32)
CREATININE: 0.78 mg/dL (ref 0.61–1.24)
CREATININE: 0.78 mg/dL (ref 0.61–1.24)
Calcium: 7 mg/dL — ABNORMAL LOW (ref 8.9–10.3)
Chloride: 103 mmol/L (ref 101–111)
GFR calc Af Amer: >60 mL/min (ref >60)
GFR calc Af Amer: >60 mL/min (ref >60)
GFR calc non Af Amer: >60 mL/min (ref >60)
GLUCOSE: 135 mg/dL — AB (ref 65–99)
GLUCOSE: 114 mg/dL — AB (ref 65–99)
Potassium: 3.5 mmol/L (ref 3.5–5.1)
Potassium: 3.6 mmol/L (ref 3.5–5.1)
SODIUM: 136 mmol/L (ref 135–145)
SODIUM: 136 mmol/L (ref 135–145)

## 2017-07-01 LAB — GLUCOSE, CAPILLARY
Glucose-Capillary: 132 mg/dL — ABNORMAL HIGH (ref 65–99)
Glucose-Capillary: 129 mg/dL — ABNORMAL HIGH (ref 65–99)
Glucose-Capillary: 125 mg/dL — ABNORMAL HIGH (ref 65–99)
Glucose-Capillary: 118 mg/dL — ABNORMAL HIGH (ref 65–99)
Glucose-Capillary: 115 mg/dL — ABNORMAL HIGH (ref 65–99)

## 2017-07-01 LAB — HEPARIN LEVEL (UNFRACTIONATED)
HEPARIN UNFRACTIONATED: 0.47 [IU]/mL (ref 0.30–0.70)
Heparin Unfractionated: 0.53 IU/mL (ref 0.30–0.70)

## 2017-07-01 LAB — ECHOCARDIOGRAM COMPLETE
Height: 71 in
Weight: 3770.75 oz

## 2017-07-01 LAB — TROPONIN I: Troponin I: 0.88 ng/mL (ref <0.03)

## 2017-07-01 LAB — PROCALCITONIN: PROCALCITONIN: 1.34 ng/mL

## 2017-07-01 MED ORDER — FREE WATER
200.0000 mL | Freq: Three times a day (TID) | Status: DC
  Administered 2017-07-01 – 2017-07-02 (×3): 200 mL

## 2017-07-01 MED ORDER — HYDRALAZINE HCL 20 MG/ML IJ SOLN
10.0000 mg | INTRAMUSCULAR | Status: DC | PRN
  Administered 2017-07-01 – 2017-07-04 (×5): 20 mg via INTRAVENOUS
  Filled 2017-07-01: qty 1
  Filled 2017-07-01 (×2): qty 2

## 2017-07-01 MED ORDER — FENTANYL BOLUS VIA INFUSION
50.0000 ug | INTRAVENOUS | Status: DC | PRN
  Filled 2017-07-01: qty 50

## 2017-07-01 MED ORDER — LABETALOL HCL 5 MG/ML IV SOLN
10.0000 mg | INTRAVENOUS | Status: DC | PRN
  Administered 2017-07-01: 10 mg via INTRAVENOUS
  Filled 2017-07-01: qty 4

## 2017-07-01 MED ORDER — SODIUM CHLORIDE 0.9 % IV SOLN
0.5000 ug/kg/min | INTRAVENOUS | Status: DC
  Administered 2017-07-01 (×2): 3 ug/kg/min via INTRAVENOUS
  Filled 2017-07-01 (×2): qty 20

## 2017-07-01 MED ORDER — POTASSIUM CHLORIDE 20 MEQ/15ML (10%) PO SOLN
40.0000 meq | Freq: Once | ORAL | Status: AC
  Administered 2017-07-01: 40 meq
  Filled 2017-07-01 (×2): qty 30

## 2017-07-01 MED ORDER — ORAL CARE MOUTH RINSE
15.0000 mL | OROMUCOSAL | Status: DC
  Administered 2017-07-01 – 2017-07-02 (×5): 15 mL via OROMUCOSAL

## 2017-07-01 MED ORDER — ARTIFICIAL TEARS OPHTHALMIC OINT
1.0000 "application " | TOPICAL_OINTMENT | Freq: Three times a day (TID) | OPHTHALMIC | Status: DC
  Administered 2017-07-01 – 2017-07-02 (×3): 1 via OPHTHALMIC
  Filled 2017-07-01: qty 3.5

## 2017-07-01 MED ORDER — SODIUM CHLORIDE 0.9% FLUSH: 10.0000 mL | INTRAVENOUS | Status: DC | PRN

## 2017-07-01 MED ORDER — HEPARIN BOLUS VIA INFUSION
1500.0000 [IU] | Freq: Once | INTRAVENOUS | Status: AC
  Administered 2017-07-01: 1500 [IU] via INTRAVENOUS
  Filled 2017-07-01: qty 1500

## 2017-07-01 MED ORDER — VITAL HIGH PROTEIN PO LIQD
1000.0000 mL | ORAL | Status: DC
  Administered 2017-07-01: 1000 mL

## 2017-07-01 MED ORDER — FENTANYL 2500MCG IN NS 250ML (10MCG/ML) PREMIX INFUSION
0.0000 ug/h | INTRAVENOUS | Status: DC
  Administered 2017-07-01 – 2017-07-02 (×2): 300 ug/h via INTRAVENOUS
  Filled 2017-07-01 (×2): qty 250

## 2017-07-01 MED ORDER — SODIUM CHLORIDE 0.9 % IV SOLN
1.0000 mg/h | INTRAVENOUS | Status: DC
  Administered 2017-07-01: 4 mg/h via INTRAVENOUS
  Administered 2017-07-01: 3 mg/h via INTRAVENOUS
  Filled 2017-07-01 (×3): qty 10

## 2017-07-01 MED ORDER — SODIUM CHLORIDE 0.9% FLUSH
10.0000 mL | Freq: Two times a day (BID) | INTRAVENOUS | Status: DC
  Administered 2017-07-01 – 2017-07-03 (×5): 10 mL
  Administered 2017-07-03: 30 mL
  Administered 2017-07-04 (×2): 10 mL

## 2017-07-01 MED ORDER — INFLUENZA VAC SPLIT QUAD 0.5 ML IM SUSY
0.5000 mL | PREFILLED_SYRINGE | INTRAMUSCULAR | Status: AC
  Administered 2017-07-03: 0.5 mL via INTRAMUSCULAR
  Filled 2017-07-01: qty 0.5

## 2017-07-01 MED ORDER — NITROGLYCERIN 2 % TD OINT
1.0000 [in_us] | TOPICAL_OINTMENT | Freq: Three times a day (TID) | TRANSDERMAL | Status: DC
  Administered 2017-07-01 – 2017-07-02 (×3): 1 [in_us] via TOPICAL
  Filled 2017-07-01 (×3): qty 1

## 2017-07-01 MED ORDER — METOPROLOL TARTRATE 25 MG PO TABS
25.0000 mg | ORAL_TABLET | Freq: Two times a day (BID) | ORAL | Status: DC
  Administered 2017-07-01 – 2017-07-02 (×3): 25 mg
  Filled 2017-07-01 (×3): qty 1

## 2017-07-01 MED ORDER — MIDAZOLAM HCL 2 MG/2ML IJ SOLN: 2.0000 mg | Freq: Once | INTRAMUSCULAR | Status: DC

## 2017-07-01 MED ORDER — INFLUENZA VAC SPLIT QUAD 0.5 ML IM SUSY: 0.5000 mL | PREFILLED_SYRINGE | INTRAMUSCULAR | Status: DC

## 2017-07-01 MED ORDER — MIDAZOLAM BOLUS VIA INFUSION
2.0000 mg | INTRAVENOUS | Status: DC | PRN
  Administered 2017-07-01: 2 mg via INTRAVENOUS
  Filled 2017-07-01: qty 2

## 2017-07-01 MED ORDER — PRO-STAT SUGAR FREE PO LIQD
30.0000 mL | Freq: Two times a day (BID) | ORAL | Status: DC
  Administered 2017-07-01 (×2): 30 mL

## 2017-07-01 NOTE — Progress Notes (Addendum)
PULMONARY / CRITICAL CARE MEDICINE   Name: Jason Gross MRN: [id number] DOB: September 26, 1955    ADMISSION DATE:  06/30/2017  PT PROFILE:   32 M with minimal PMH admitted after out of hospital cardiac arrest (shockable rhythm - presumed VF). 10-15 mins of pulselessness. Wife administered chest compressions awaiting EMS. TTM protocol (36 degrees) initiated.   MAJOR EVENTS/TEST RESULTS: 12/04 Admission as above 12/04 CT head/neck: no acute findings 12/04 CTA chest/abd/pelvis: No CT evidence for dissection or other acute aortic pathology. Acute nondisplaced fractures of the left anterior third through sixth ribs, which may be related to chest compressions given history of recent CPR. Layering secretions within the subglottic trachea and right mainstem bronchus. Associated bibasilar atelectasis. No other acute abnormality within the chest, abdomen, and pelvis 12/04 LHC: Three-vessel coronary artery disease, including moderate diffuse mid and distal LAD disease, 95% D1 stenosis, and chronic total occlusions of the proximal/mid LCx and proximal/mid RCA. Suspect culprit vessel may have been D1, as other occlusions are well-collateralized and likely chronic. Med rx recommended 12/05 Echocardiogram: LVEF 65-70%. No regional wall motion abnormalities. No significant valvular abnormalities  12/05 Rewarming. Off vasopressors.  12/05 PM: Again developed severe ventilator dyssynchrony despite heavy level of sedation. NMB resumed. EKG with ischemic changes across the precordium. Metoprolol and NTG paste initiated  INDWELLING DEVICES:: ETT 12/04 >>  R IJ CVL 12/04 >>  R radial A-line 12/04 >>   MICRO DATA: MRSA PCR 12/04 >> NEG Resp 12/04 >>  Blood 12/04 >>   ANTIMICROBIALS:  Pip-tazo 12/04 >>      SUBJECTIVE:  Rewarming initiated.  Moves all extremities  VITAL SIGNS: BP 127/78   Pulse 89   Temp 99.5 F (37.5 C)   Resp 19   Ht 5\' 11"  (1.803 m)   Wt 106.9 kg (235 lb 10.8 oz)   SpO2 93%   BMI  32.87 kg/m   HEMODYNAMICS:    VENTILATOR SETTINGS: Vent Mode: PRVC FiO2 (%):  [35 %-45 %] 35 % Set Rate:  [18 bmp] 18 bmp Vt Set:  [500 mL] 500 mL PEEP:  [5 cmH20] 5 cmH20 Plateau Pressure:  [19 cmH20-20 cmH20] 20 cmH20  INTAKE / OUTPUT: I/O last 3 completed shifts: In: 2259.6 [I.V.:2109.6; IV Piggyback:150] Out: 2410 [Urine:2410]  PHYSICAL EXAMINATION: General: Intubated, sedated Neuro: CNs intact, moves all extremities HEENT: NCAT, sclerae white Cardiovascular: Regular, no M noted Lungs: Clear anteriorly Abdomen: Soft, diminished bowel sounds Extremities: No edema  LABS:  BMET Recent Labs  Lab 06/30/17 0600 06/30/17 1532 07/01/17 0341  NA 136 137 136  K 4.3 5.1 3.5  CL 108 107 105  CO2 23 23 21*  BUN 14 14 15   CREATININE 1.04  0.99 0.78 0.78  GLUCOSE 171* 137* 135*    Electrolytes Recent Labs  Lab 06/30/17 0600 06/30/17 1532 07/01/17 0341  CALCIUM 7.3* 7.0* 7.0*  MG 1.6*  --   --   PHOS 3.3  --   --     CBC Recent Labs  Lab 06/30/17 0126 06/30/17 0600 07/01/17 0341  WBC 17.6* 17.7* 10.0  HGB 15.0 13.7 12.7*  HCT 44.2 39.5* 36.2*  PLT 282 241 152    Coag's Recent Labs  Lab 06/30/17 0600  APTT 27  INR 1.13    Sepsis Markers Recent Labs  Lab 06/30/17 0200 06/30/17 0600 07/01/17 0341  LATICACIDVEN 5.0* 2.0*  --   PROCALCITON  --  0.60 1.34    ABG Recent Labs  Lab 06/30/17 0200 06/30/17 0610  PHART 7.28* 7.25*  PCO2ART 43 54*  PO2ART 92 233*    Liver Enzymes Recent Labs  Lab 06/30/17 0126 06/30/17 0600  AST 74* 69*  ALT 68* 66*  ALKPHOS 51 36*  BILITOT 1.0 0.7  ALBUMIN 3.7 3.2*    Cardiac Enzymes Recent Labs  Lab 06/30/17 1211 06/30/17 1540 06/30/17 2212  TROPONINI 4.19* 3.16* 2.18*    Glucose Recent Labs  Lab 06/30/17 1647 06/30/17 1943 06/30/17 2338 07/01/17 0351 07/01/17 0900 07/01/17 1140  GLUCAP 105* 140* 108* 132* 129* 125*    CXR: Air bronchograms in the retrocardiac  area    ASSESSMENT / PLAN:  PULMONARY A: Ventilator dependent respiratory failure after cardiac arrest Suspected pneumonia P:   Cont full vent support - settings reviewed and/or adjusted Cont vent bundle Daily SBT if/when meets criteria   CARDIOVASCULAR A:  Cardiac arrest -likely ischemic in nature NSTEMI Three-vessel coronary disease Hypertension P:  Continue ASA, heparin Metoprolol initiated 12/05 Hydralazine as needed to maintain SBP <160 mmHg Cardiology following  RENAL A:   Borderline hypokalemia P:   Monitor BMET intermittently Monitor I/Os Correct electrolytes as indicated   GASTROINTESTINAL A:   No issues P:   SUP: IV pantoprazole Consider TF 12/06 if not extubated  HEMATOLOGIC A:   No issues P:  DVT px: full dose heparin Monitor CBC intermittently Transfuse per usual guidelines   INFECTIOUS A:   Suspected aspiration pneumonia P:   Monitor temp, WBC count Micro and abx as above   ENDOCRINE A:   Mild stress-induced hyperglycemia P:   Continue sensitive scale SSI  NEUROLOGIC A:   Anoxic encephalopathy after cardiac arrest ICU/ventilator associated discomfort P:   RASS goal: -1, -2 Cont PAD protocol - propofol/fentanyl infusions DC NMB protocol  ADD 12/05 PM: Again developed severe ventilator dyssynchrony despite heavy level of sedation. NMB resumed.   FAMILY  - Updates: Wife updated in detail at bedside   CCM time: 60 mins  The above time includes time spent in consultation with patient and/or family members and reviewing care plan on multidisciplinary rounds  Merton Border, MD PCCM service Mobile [phone] Pager [phone]    07/01/2017, 12:06 PM

## 2017-07-01 NOTE — Progress Notes (Signed)
*  PRELIMINARY RESULTS* Echocardiogram 2D Echocardiogram has been performed.  Jason Gross 07/01/2017, 11:16 AM

## 2017-07-01 NOTE — Progress Notes (Signed)
Decreased to 30% Fio2, sats 100%. Will continue to monitor.

## 2017-07-01 NOTE — Progress Notes (Signed)
ANTICOAGULATION CONSULT NOTE - Initial Consult  Pharmacy Consult for heparin Indication: chest pain/ACS s/p cardiac arrest  No Known Allergies  Patient Measurements: Height: '5\' 11"'  (180.3 cm) Weight: 235 lb 10.8 oz (106.9 kg) IBW/kg (Calculated) : 75.3 Heparin Dosing Weight: 97 kg  Vital Signs: Temp: 97.2 F (36.2 C) (12/05 0600) BP: 103/58 (12/05 0600) Pulse Rate: 57 (12/05 0600)  Labs: Recent Labs    06/30/17 0126 06/30/17 0600 06/30/17 1211 06/30/17 1532 06/30/17 1540 06/30/17 2212 07/01/17 0341 07/01/17 0638  HGB 15.0 13.7  --   --   --   --  12.7*  --   HCT 44.2 39.5*  --   --   --   --  36.2*  --   PLT 282 241  --   --   --   --  152  --   APTT  --  27  --   --   --   --   --   --   LABPROT  --  14.4  --   --   --   --   --   --   INR  --  1.13  --   --   --   --   --   --   HEPARINUNFRC  --   --   --   --   --  0.29*  --  0.53  CREATININE 1.22 1.04  0.99  --  0.78  --   --  0.78  --   TROPONINI 0.09* 4.92* 4.19*  --  3.16* 2.18*  --   --     Estimated Creatinine Clearance: 122.1 mL/min (by C-G formula based on SCr of 0.78 mg/dL).   Medical History: Past Medical History:  Diagnosis Date  . Allergy   . Benign mass of parotid gland   . GERD (gastroesophageal reflux disease)   . Hyperlipidemia    not on meds  . Hypertension   . Vertigo     Medications:  Scheduled:  . artificial tears  1 application Both Eyes U3J  . aspirin  81 mg Oral Pre-Cath  . aspirin EC  81 mg Oral Daily  . atorvastatin  40 mg Per Tube q1800  . chlorhexidine gluconate (MEDLINE KIT)  15 mL Mouth Rinse BID  . Influenza vac split quadrivalent PF  0.5 mL Intramuscular Tomorrow-1000  . insulin aspart  0-9 Units Subcutaneous Q4H  . ipratropium-albuterol  3 mL Nebulization Q6H  . mouth rinse  15 mL Mouth Rinse QID  . pantoprazole (PROTONIX) IV  40 mg Intravenous Daily  . sodium chloride flush  3 mL Intravenous Q12H  . sodium chloride flush  3 mL Intravenous Q12H     Assessment: Patient admitted for cardiac arrest trops up to 4.92. Patient is on TTM and is now s/p cath who will need CABG or PCI.  Goal of Therapy:  Heparin level 0.3-0.7 units/ml Monitor platelets by anticoagulation protocol: Yes   Plan:  Will resume heparin drip at 1100 units/hr with no bolus  2 hours after TR band deflation. Will check a HL 6 hours after resuming infusion.   12/5 @ 2212 HL 0.29 subtherapeutic. Will rebolus w/ heparin 1500 units IV x 1 and increase rate to 1250 units/hr and will recheck HL @ 0700 and CBC w/ am labs.  12/5 @ 0630 HL 0.53 therapeutic. Will continue current rate and will recheck @ 1230.  Tobie Lords, PharmD, BCPS Clinical Pharmacist 07/01/2017

## 2017-07-01 NOTE — Progress Notes (Signed)
[facility] [place] NAME: Jason Gross    MR#:  [id number]  DATE OF BIRTH:  November 13, 1955  SUBJECTIVE:   Patient is intubated and sedated on vent  REVIEW OF SYSTEMS:    Intubated and sedated  Tolerating Diet: npo      DRUG ALLERGIES:  No Known Allergies  VITALS:  Blood pressure (!) 103/58, pulse (!) 57, temperature (!) 97.2 F (36.2 C), resp. rate 18, height 5\' 11"  (1.803 m), weight 106.9 kg (235 lb 10.8 oz), SpO2 100 %.  PHYSICAL EXAMINATION:  Constitutional: Appears well-developed and well-nourished. No distress. Intubated HENT: Normocephalic. . Intubated Eyes: Conjunctivae  are normal.  no scleral icterus.  Neck: Normal ROM. Neck supple. No JVD. No tracheal deviation. CVS: RRR, S1/S2 +, no murmurs, no gallops, no carotid bruit.  Pulmonary: Effort and breath sounds normal, no stridor, rhonchi, wheezes, rales.  Abdominal: Soft. BS +,  no distension, tenderness, rebound or guarding.  Musculoskeletal: Normal range of motion. No edema and no tenderness.  Neuro: Sedated on VENT Skin: Skin is warm and dry. No rash noted. Psychiatric: Sedated.      LABORATORY PANEL:   CBC Recent Labs  Lab 07/01/17 0341  WBC 10.0  HGB 12.7*  HCT 36.2*  PLT 152   ------------------------------------------------------------------------------------------------------------------  Chemistries  Recent Labs  Lab 06/30/17 0600  07/01/17 0341  NA 136   < > 136  K 4.3   < > 3.5  CL 108   < > 105  CO2 23   < > 21*  GLUCOSE 171*   < > 135*  BUN 14   < > 15  CREATININE 1.04  0.99   < > 0.78  CALCIUM 7.3*   < > 7.0*  MG 1.6*  --   --   AST 69*  --   --   ALT 66*  --   --   ALKPHOS 36*  --   --   BILITOT 0.7  --   --    < > = values in this interval not displayed.   ------------------------------------------------------------------------------------------------------------------  Cardiac Enzymes Recent Labs  Lab 06/30/17 1211  06/30/17 1540 06/30/17 2212  TROPONINI 4.19* 3.16* 2.18*   ------------------------------------------------------------------------------------------------------------------  RADIOLOGY:  Dg Abdomen 1 View  Result Date: 06/30/2017 CLINICAL DATA:  Orogastric tube placement. EXAM: ABDOMEN - 1 VIEW COMPARISON:  None. FINDINGS: The bowel gas pattern is normal. Orogastric tube at the expected location of gastric body. Shock pads overlies the lower thorax/upper abdomen. IMPRESSION: Orogastric tube at the expected location of gastric body. Electronically Signed   By: Fidela Salisbury M.D.   On: 06/30/2017 02:52   Ct Head Wo Contrast  Result Date: 06/30/2017 CLINICAL DATA:  Found unresponsive. EXAM: CT HEAD WITHOUT CONTRAST CT CERVICAL SPINE WITHOUT CONTRAST TECHNIQUE: Multidetector CT imaging of the head and cervical spine was performed following the standard protocol without intravenous contrast. Multiplanar CT image reconstructions of the cervical spine were also generated. COMPARISON:  None. FINDINGS: CT HEAD FINDINGS Brain: No evidence of acute infarction, hemorrhage, hydrocephalus, extra-axial collection or mass lesion/mass effect. Vascular: No hyperdense vessel or unexpected calcification. Calcific atherosclerotic disease of the intracavernous internal carotid arteries. Skull: Normal. Negative for fracture or focal lesion. Sinuses/Orbits: No acute finding. Other: None. CT CERVICAL SPINE FINDINGS Alignment: Stranding of the cervical lordosis, likely position. Skull base and vertebrae: No acute fracture. No primary bone lesion or focal pathologic process. Soft tissues and spinal canal: No prevertebral fluid or swelling. No  visible canal hematoma. Disc levels: Mild multilevel osteoarthritic changes of the cervical spine. Upper chest: Post intubation and placement of enteric catheter. Debris within the trachea. Airspace consolidation in the visualized posterior aspect of the lung parenchyma, possibly due  to aspiration. Other: None. IMPRESSION: No acute intracranial abnormality. No evidence of acute traumatic injury to cervical spine. Post intubation and placement of enteric catheter. Debris within the trachea. Airspace consolidation in the visualized posterior aspect of the lung parenchyma, possibly due to aspiration. Electronically Signed   By: Fidela Salisbury M.D.   On: 06/30/2017 03:03   Ct Cervical Spine Wo Contrast  Result Date: 06/30/2017 CLINICAL DATA:  Found unresponsive. EXAM: CT HEAD WITHOUT CONTRAST CT CERVICAL SPINE WITHOUT CONTRAST TECHNIQUE: Multidetector CT imaging of the head and cervical spine was performed following the standard protocol without intravenous contrast. Multiplanar CT image reconstructions of the cervical spine were also generated. COMPARISON:  None. FINDINGS: CT HEAD FINDINGS Brain: No evidence of acute infarction, hemorrhage, hydrocephalus, extra-axial collection or mass lesion/mass effect. Vascular: No hyperdense vessel or unexpected calcification. Calcific atherosclerotic disease of the intracavernous internal carotid arteries. Skull: Normal. Negative for fracture or focal lesion. Sinuses/Orbits: No acute finding. Other: None. CT CERVICAL SPINE FINDINGS Alignment: Stranding of the cervical lordosis, likely position. Skull base and vertebrae: No acute fracture. No primary bone lesion or focal pathologic process. Soft tissues and spinal canal: No prevertebral fluid or swelling. No visible canal hematoma. Disc levels: Mild multilevel osteoarthritic changes of the cervical spine. Upper chest: Post intubation and placement of enteric catheter. Debris within the trachea. Airspace consolidation in the visualized posterior aspect of the lung parenchyma, possibly due to aspiration. Other: None. IMPRESSION: No acute intracranial abnormality. No evidence of acute traumatic injury to cervical spine. Post intubation and placement of enteric catheter. Debris within the trachea. Airspace  consolidation in the visualized posterior aspect of the lung parenchyma, possibly due to aspiration. Electronically Signed   By: Fidela Salisbury M.D.   On: 06/30/2017 03:03   Portable Chest Xray  Result Date: 07/01/2017 CLINICAL DATA:  Respiratory failure EXAM: PORTABLE CHEST 1 VIEW COMPARISON:  06/30/2017 FINDINGS: Cardiac shadow is mildly enlarged but accentuated by the portable technique. Endotracheal tube, nasogastric catheter and right jugular central line are again noted and stable. The overall inspiratory effort is poor with bibasilar atelectatic changes. No new focal abnormality is noted. IMPRESSION: Stable bibasilar atelectatic changes. Tubes and lines as described. Electronically Signed   By: Inez Catalina M.D.   On: 07/01/2017 07:21   Dg Chest Port 1 View  Result Date: 06/30/2017 CLINICAL DATA:  Endotracheal tube EXAM: PORTABLE CHEST 1 VIEW COMPARISON:  06/30/2017 FINDINGS: Endotracheal tube 2 cm above the carina. Right jugular catheter tip in the SVC. NG tube in the stomach. Mild improvement bibasilar atelectasis. Continued hypoventilation. Negative for heart failure or effusion. No pneumothorax IMPRESSION: Mild improvement hypoventilation and bibasilar atelectasis. Support lines remain in satisfactory position. Electronically Signed   By: Franchot Gallo M.D.   On: 06/30/2017 09:17   Dg Chest Port 1 View  Result Date: 06/30/2017 CLINICAL DATA:  Status post advancement of ET tube today. EXAM: PORTABLE CHEST 1 VIEW COMPARISON:  Single-view of the chest earlier today. FINDINGS: Endotracheal tube has been advanced. The tip is just below the thoracic inlet and the tube should be advanced 1-2 cm for better positioning. Defibrillator pad has been removed. Right IJ catheter remains in place. Subsegmental atelectasis left mid lung noted. There is cardiomegaly. IMPRESSION: ETT tip is just below the  thoracic inlet and should be advanced 1-2 cm. No other change. Electronically Signed   By: Inge Rise M.D.   On: 06/30/2017 08:16   Dg Chest Port 1 View  Result Date: 06/30/2017 CLINICAL DATA:  Status post central line placement EXAM: PORTABLE CHEST 1 VIEW COMPARISON:  Chest x-ray and chest CT scan of earlier today FINDINGS: The lungs remain hypoinflated. The right internal jugular venous catheter tip projects over the junction of the proximal and middle thirds of the SVC. No postprocedure pneumothorax is observed. The cardiac silhouette is enlarged. The pulmonary vascularity is not clearly engorged. S sternal pacemaker defibrillator pads are present. The esophagogastric tube tip in proximal port project in the proximal gastric body. The endotracheal tube tip appears to be somewhat high in position approximately 2 cm above the superior margin of the clavicular heads at approximately 8.1 cm above the carina. Known right anterior rib fractures are not demonstrated on today's study. IMPRESSION: No postprocedure complication following right internal jugular venous catheter placement. High positioning of the endotracheal tube. Advancement by at least 5 cm is recommended. Good positioning of the esophagogastric tube. Bilateral hypoinflation. Cardiomegaly without pulmonary vascular congestion. Electronically Signed   By: David  Martinique M.D.   On: 06/30/2017 06:56   Dg Chest Portable 1 View  Addendum Date: 06/30/2017   ADDENDUM REPORT: 06/30/2017 05:12 ADDENDUM: These results were called by telephone at the time of interpretation on 06/30/2017 at 2:55 am to Dr. Charlesetta Ivory , who verbally acknowledged these results. Electronically Signed   By: Fidela Salisbury M.D.   On: 06/30/2017 05:12   Result Date: 06/30/2017 CLINICAL DATA:  Intubation. EXAM: PORTABLE CHEST 1 VIEW COMPARISON:  10/28/2016 FINDINGS: Endotracheal tube at the level of T1 vertebral body. Tortuosity of the trachea at the cervicothoracic junction. Enteric catheter descends the abdomen. Shock pads overlie the chest. There is enlargement  of the mediastinum which measures 12 cm. No evidence of pneumothorax. Low lung volumes. No focal airspace consolidation. Osseous structures are without acute abnormality. Soft tissues are grossly normal. IMPRESSION: Apparent widening of the mediastinum. This may be due to portable technique and low lung volumes, however acute aortic pathology cannot be excluded. If clinically warranted, CT angiogram of the chest should be considered. Endotracheal tube at the cervicothoracic junction. Advancement by 5 cm is recommended. Bowling of the tracheal air column at the cervicothoracic junction with uncertain significance. Electronically Signed: By: Fidela Salisbury M.D. On: 06/30/2017 02:50   Ct Angio Chest/abd/pel For Dissection W And/or Wo Contrast  Result Date: 06/30/2017 CLINICAL DATA:  Initial evaluation for acute cardiac arrest. EXAM: CT ANGIOGRAPHY CHEST, ABDOMEN AND PELVIS TECHNIQUE: Multidetector CT imaging through the chest, abdomen and pelvis was performed using the standard protocol during bolus administration of intravenous contrast. Multiplanar reconstructed images and MIPs were obtained and reviewed to evaluate the vascular anatomy. CONTRAST:  183mL ISOVUE-370 IOPAMIDOL (ISOVUE-370) INJECTION 76% COMPARISON:  Prior radiograph from earlier same day. FINDINGS: CTA CHEST FINDINGS Cardiovascular: Precontrast imaging through the intrathoracic aorta at demonstrates no acute abnormality. Mild scattered atheromatous plaque within the aortic arch and descending intrathoracic aorta. Postcontrast imaging demonstrates no evidence for dissection or other acute abnormality. Partially visualized great vessels within normal limits. Cardiomegaly. No pericardial effusion. Limited evaluation of the pulmonary arterial tree grossly unremarkable. Mediastinum/Nodes: Thyroid normal. No enlarged mediastinal, hilar, or axillary lymph nodes. No mediastinal hematoma. Enteric tube in place within the esophagus. Lungs/Pleura:  Endotracheal tube in place. Layering secretions within the subglottic trachea extending into the right  mainstem bronchus. Deep tendon atelectasis present within the lower lobes bilaterally. Lungs are otherwise largely clear. No other focal infiltrates. No pulmonary edema or pleural effusion. No pneumothorax. No worrisome pulmonary nodule or mass. Musculoskeletal: External soft tissues demonstrate no acute abnormality. Acute nondisplaced fractures of the left anterior third through sixth ribs, which may be related to chest compressions. No other acute osseus abnormality. No worrisome lytic or blastic osseous lesions. Review of the MIP images confirms the above findings. CTA ABDOMEN AND PELVIS FINDINGS VASCULAR Aorta: Intra-abdominal Eric or neural well opacified without evidence for dissection or other acute abnormality. No aneurysm. Moderate atherosclerosis. Celiac: Stenosis of approximately 40% present at the origin of the celiac. Celiac axis and its branch vessels well opacified distally. SMA: Atheromatous plaque at the proximal SMA without flow-limiting stenosis. SMA patent to its distal aspects. Renals: Single renal arteries present bilaterally. Atheromatous plaque about the origin of the renal artery is without high-grade stenosis. IMA: IMA patent to its distal aspect. Inflow: Iliac artery is widely patent bilaterally. Veins: No acute venous abnormality. Review of the MIP images confirms the above findings. NON-VASCULAR Hepatobiliary: The liver demonstrates no acute abnormality. Gallbladder within normal limits. No biliary dilatation. Pancreas: Pancreas within normal limits. Spleen: Spleen within normal limits. Adrenals/Urinary Tract: 2.7 cm heterogeneous right adrenal nodule, indeterminate. Adrenal glands otherwise unremarkable. Kidneys equal in size with symmetric enhancement. Few scattered renal cysts noted bilaterally, right greater than left. No nephrolithiasis or hydronephrosis. No focal enhancing renal  mass. No hydroureter. Bladder largely decompressed with a Foley catheter in place. Stomach/Bowel: Enteric tube terminates within the stomach. No evidence for bowel obstruction. Normal appendix. No acute inflammatory changes seen about the bowels. Lymphatic: No adenopathy. Reproductive: Prostate normal. Other: No free air or fluid. Small fat containing paraumbilical hernia noted. Musculoskeletal: No acute osseus abnormality. No worrisome lytic or blastic osseous lesions. Mild scoliosis noted. Review of the MIP images confirms the above findings. IMPRESSION: 1. No CT evidence for dissection or other acute aortic pathology. 2. Acute nondisplaced fractures of the left anterior third through sixth ribs, which may be related to chest compressions given history of recent CPR. 3. Endotracheal and enteric tubes in place. Layering secretions within the subglottic trachea and right mainstem bronchus. Associated bibasilar atelectasis. 4. No other acute abnormality within the chest, abdomen, and pelvis. 5. 2.7 cm heterogeneous right adrenal nodule, indeterminate. Follow-up examination with nonemergent adrenal mass protocol CT and/or MRI suggested. Electronically Signed   By: Jeannine Boga M.D.   On: 06/30/2017 05:26     ASSESSMENT AND PLAN:   61 year old male with history of essential hypertension who presents after cardiac arrest.  1. Cardiac arrest due to non-ST elevation MI status post cardiac catheterization Cardiac catheter shows mild LV systolic distant with occluded circumflex and right coronary artery which appeared to be old and have collateral blood flow from left anterior descending artery. Diagonal artery with some atherosclerosis and possible culprit lesion but small artery not amenable and not appropriate for PCI and stent placement. Left anterior descending artery with modest LAD stenosis and not requiring further intervention  Appreciate cardiology consultation Continue heparin for total 48  hours then consider changing to aspirin and Plavix. No beta blocker or ACE inhibitor due to hypotension  2. Acute hypoxic respiratory failure due to cardiac arrest/aspiration pneumonia: Ventilator management as per ICU team. Continue Zosyn  3. Hyperglycemia: Check A1c Continue sliding scale  4. Acute metabolic encephalopathy due to cardiac arrest: Targeted temperature management Monitor neurological status   Management  plans discussed with the patient's wife  and she is in agreement.  CODE STATUS: FULL  TOTAL TIME TAKING CARE OF THIS PATIENT: 30 minutes.     POSSIBLE D/C ??, DEPENDING ON CLINICAL CONDITION.   Ingra Rother M.D on 07/01/2017 at 8:14 AM  Between 7am to 6pm - Pager - [phone] After 6pm go to [email or web address] - password [email or web address] [facility]  Office  [phone]  CC: Primary care physician; Venia Carbon, MD  Note: This dictation was prepared with Dragon dictation along with smaller phrase technology. Any transcriptional errors that result from this process are unintentional.

## 2017-07-01 NOTE — Progress Notes (Signed)
ANTICOAGULATION CONSULT NOTE - Initial Consult  Pharmacy Consult for heparin Indication: chest pain/ACS s/p cardiac arrest  No Known Allergies  Patient Measurements: Height: '5\' 11"'  (180.3 cm) Weight: 226 lb (102.5 kg) IBW/kg (Calculated) : 75.3 Heparin Dosing Weight: 97 kg  Vital Signs: Temp: 97.3 F (36.3 C) (12/04 2300) Temp Source: Bladder (12/04 1600) BP: 97/56 (12/04 2300) Pulse Rate: 60 (12/04 2300)  Labs: Recent Labs    06/30/17 0126 06/30/17 0600 06/30/17 1211 06/30/17 1532 06/30/17 1540 06/30/17 2212  HGB 15.0 13.7  --   --   --   --   HCT 44.2 39.5*  --   --   --   --   PLT 282 241  --   --   --   --   APTT  --  27  --   --   --   --   LABPROT  --  14.4  --   --   --   --   INR  --  1.13  --   --   --   --   HEPARINUNFRC  --   --   --   --   --  0.29*  CREATININE 1.22 1.04  0.99  --  0.78  --   --   TROPONINI 0.09* 4.92* 4.19*  --  3.16* 2.18*    Estimated Creatinine Clearance: 119.7 mL/min (by C-G formula based on SCr of 0.78 mg/dL).   Medical History: Past Medical History:  Diagnosis Date  . Allergy   . Benign mass of parotid gland   . GERD (gastroesophageal reflux disease)   . Hyperlipidemia    not on meds  . Hypertension   . Vertigo     Medications:  Scheduled:  . artificial tears  1 application Both Eyes G2R  . aspirin  81 mg Oral Pre-Cath  . aspirin EC  81 mg Oral Daily  . atorvastatin  40 mg Per Tube q1800  . chlorhexidine gluconate (MEDLINE KIT)  15 mL Mouth Rinse BID  . Influenza vac split quadrivalent PF  0.5 mL Intramuscular Tomorrow-1000  . insulin aspart  0-9 Units Subcutaneous Q4H  . ipratropium-albuterol  3 mL Nebulization Q6H  . mouth rinse  15 mL Mouth Rinse QID  . pantoprazole (PROTONIX) IV  40 mg Intravenous Daily  . sodium chloride flush  3 mL Intravenous Q12H  . sodium chloride flush  3 mL Intravenous Q12H    Assessment: Patient admitted for cardiac arrest trops up to 4.92. Patient is on TTM and is now s/p cath  who will need CABG or PCI.  Goal of Therapy:  Heparin level 0.3-0.7 units/ml Monitor platelets by anticoagulation protocol: Yes   Plan:  Will resume heparin drip at 1100 units/hr with no bolus  2 hours after TR band deflation. Will check a HL 6 hours after resuming infusion.   12/5 @ 2212 HL 0.29 subtherapeutic. Will rebolus w/ heparin 1500 units IV x 1 and increase rate to 1250 units/hr and will recheck HL @ 0700 and CBC w/ am labs.  Tobie Lords, PharmD, BCPS Clinical Pharmacist 07/01/2017

## 2017-07-01 NOTE — Progress Notes (Signed)
[facility] Encounter Note  Patient: Jason Gross / Admit Date: 06/30/2017 / Date of Encounter: 07/01/2017, 8:52 AM   Subjective: Patient is still intubated status post what appears to be a non-ST elevation myocardial infarction Cardiac catheter shows mild LV systolic distant with occluded circumflex and right coronary artery which appeared to be old and have collateral blood flow from left anterior descending artery. Diagonal artery with some atherosclerosis and possible culprit lesion but small artery not amenable and not appropriate for PCI and stent placement. Left anterior descending artery with modest LAD stenosis and not requiring further intervention Review of Systems:  Cannot assess Objective: Telemetry: Normal sinus rhythm Physical Exam: Blood pressure (!) 90/53, pulse (!) 51, temperature (!) 95.2 F (35.1 C), temperature source Rectal, resp. rate 18, height _0  (1.803 m), weight 106.9 kg (235 lb 10.8 oz), SpO2 100 %. Body mass index is 32.87 kg/m. General: Well developed, well nourished, in no acute distress. Head: Normocephalic, atraumatic, sclera non-icteric, no xanthomas, nares are without discharge. Neck: No apparent masses Lungs: Normal respirations with no wheezes, no rhonchi, no rales , no crackles   Heart: Regular rate and rhythm, normal S1 S2, no murmur, no rub, no gallop, PMI is normal size and placement, carotid upstroke normal without bruit, jugular venous pressure normal Abdomen: Soft, non-tender, non-distended with normoactive bowel sounds. No hepatosplenomegaly. Abdominal aorta is normal size without bruit Extremities: No edema, no clubbing, no cyanosis, no ulcers,  Peripheral: 2+ radial, 2+ femoral, 2+ dorsal pedal pulses Neuro: Not Alert and oriented. Does not Moves all extremities spontaneously. Psych:  Does not Responds to questions appropriately with a normal affect.   Intake/Output Summary (Last 24 hours) at 07/01/2017 0852 Last  data filed at 07/01/2017 0841 Gross per 24 hour  Intake 2628.59 ml  Output 2410 ml  Net 218.59 ml    Inpatient Medications:  . artificial tears  1 application Both Eyes Z0S  . aspirin  81 mg Oral Pre-Cath  . aspirin EC  81 mg Oral Daily  . atorvastatin  40 mg Per Tube q1800  . chlorhexidine gluconate (MEDLINE KIT)  15 mL Mouth Rinse BID  . Influenza vac split quadrivalent PF  0.5 mL Intramuscular Tomorrow-1000  . insulin aspart  0-9 Units Subcutaneous Q4H  . ipratropium-albuterol  3 mL Nebulization Q6H  . mouth rinse  15 mL Mouth Rinse QID  . pantoprazole (PROTONIX) IV  40 mg Intravenous Daily  . sodium chloride flush  3 mL Intravenous Q12H  . sodium chloride flush  3 mL Intravenous Q12H   Infusions:  . sodium chloride    . sodium chloride    . sodium chloride    . cisatracurium (NIMBEX) infusion 2 mcg/kg/min (07/01/17 9233)  . fentaNYL infusion INTRAVENOUS 300 mcg/hr (07/01/17 0526)  . fentaNYL infusion INTRAVENOUS 250 mcg/hr (07/01/17 0841)  . heparin 1,250 Units/hr (07/01/17 0841)  . lactated ringers 50 mL/hr at 07/01/17 0841  . midazolam (VERSED) infusion Stopped (06/30/17 0076)  . norepinephrine (LEVOPHED) Adult infusion Stopped (06/30/17 2263)  . piperacillin-tazobactam (ZOSYN)  IV 3.375 g (07/01/17 0532)  . propofol (DIPRIVAN) infusion 40 mcg/kg/min (07/01/17 0401)  . propofol (DIPRIVAN) infusion 40 mcg/kg/min (07/01/17 0841)    Labs: Recent Labs    06/30/17 0600 06/30/17 1532 07/01/17 0341  NA 136 137 136  K 4.3 5.1 3.5  CL 108 107 105  CO2 23 23 21*  GLUCOSE 171* 137* 135*  BUN _1 CREATININE 1.04  0.99 0.78 0.78  CALCIUM 7.3* 7.0* 7.0*  MG 1.6*  --   --   PHOS 3.3  --   --    Recent Labs    06/30/17 0126 06/30/17 0600  AST 74* 69*  ALT 68* 66*  ALKPHOS 51 36*  BILITOT 1.0 0.7  PROT 6.7 5.8*  ALBUMIN 3.7 3.2*   Recent Labs    06/30/17 0600 07/01/17 0341  WBC 17.7* 10.0  HGB 13.7 12.7*  HCT 39.5* 36.2*  MCV 84.9 84.3  PLT 241 152    Recent Labs    06/30/17 0600 06/30/17 1211 06/30/17 1540 06/30/17 2212  TROPONINI 4.92* 4.19* 3.16* 2.18*   Invalid input(s): POCBNP Recent Labs    06/30/17 0600  HGBA1C 5.6     Weights: Filed Weights   06/30/17 0129 07/01/17 0500  Weight: 102.5 kg (226 lb) 106.9 kg (235 lb 10.8 oz)     Radiology/Studies:  Dg Abdomen 1 View  Result Date: 06/30/2017 CLINICAL DATA:  Orogastric tube placement. EXAM: ABDOMEN - 1 VIEW COMPARISON:  None. FINDINGS: The bowel gas pattern is normal. Orogastric tube at the expected location of gastric body. Shock pads overlies the lower thorax/upper abdomen. IMPRESSION: Orogastric tube at the expected location of gastric body. Electronically Signed   By: Fidela Salisbury M.D.   On: 06/30/2017 02:52   Ct Head Wo Contrast  Result Date: 06/30/2017 CLINICAL DATA:  Found unresponsive. EXAM: CT HEAD WITHOUT CONTRAST CT CERVICAL SPINE WITHOUT CONTRAST TECHNIQUE: Multidetector CT imaging of the head and cervical spine was performed following the standard protocol without intravenous contrast. Multiplanar CT image reconstructions of the cervical spine were also generated. COMPARISON:  None. FINDINGS: CT HEAD FINDINGS Brain: No evidence of acute infarction, hemorrhage, hydrocephalus, extra-axial collection or mass lesion/mass effect. Vascular: No hyperdense vessel or unexpected calcification. Calcific atherosclerotic disease of the intracavernous internal carotid arteries. Skull: Normal. Negative for fracture or focal lesion. Sinuses/Orbits: No acute finding. Other: None. CT CERVICAL SPINE FINDINGS Alignment: Stranding of the cervical lordosis, likely position. Skull base and vertebrae: No acute fracture. No primary bone lesion or focal pathologic process. Soft tissues and spinal canal: No prevertebral fluid or swelling. No visible canal hematoma. Disc levels: Mild multilevel osteoarthritic changes of the cervical spine. Upper chest: Post intubation and placement of  enteric catheter. Debris within the trachea. Airspace consolidation in the visualized posterior aspect of the lung parenchyma, possibly due to aspiration. Other: None. IMPRESSION: No acute intracranial abnormality. No evidence of acute traumatic injury to cervical spine. Post intubation and placement of enteric catheter. Debris within the trachea. Airspace consolidation in the visualized posterior aspect of the lung parenchyma, possibly due to aspiration. Electronically Signed   By: Fidela Salisbury M.D.   On: 06/30/2017 03:03   Ct Cervical Spine Wo Contrast  Result Date: 06/30/2017 CLINICAL DATA:  Found unresponsive. EXAM: CT HEAD WITHOUT CONTRAST CT CERVICAL SPINE WITHOUT CONTRAST TECHNIQUE: Multidetector CT imaging of the head and cervical spine was performed following the standard protocol without intravenous contrast. Multiplanar CT image reconstructions of the cervical spine were also generated. COMPARISON:  None. FINDINGS: CT HEAD FINDINGS Brain: No evidence of acute infarction, hemorrhage, hydrocephalus, extra-axial collection or mass lesion/mass effect. Vascular: No hyperdense vessel or unexpected calcification. Calcific atherosclerotic disease of the intracavernous internal carotid arteries. Skull: Normal. Negative for fracture or focal lesion. Sinuses/Orbits: No acute finding. Other: None. CT CERVICAL SPINE FINDINGS Alignment: Stranding of the cervical lordosis, likely position. Skull base and vertebrae: No acute fracture. No primary bone lesion or focal pathologic  process. Soft tissues and spinal canal: No prevertebral fluid or swelling. No visible canal hematoma. Disc levels: Mild multilevel osteoarthritic changes of the cervical spine. Upper chest: Post intubation and placement of enteric catheter. Debris within the trachea. Airspace consolidation in the visualized posterior aspect of the lung parenchyma, possibly due to aspiration. Other: None. IMPRESSION: No acute intracranial abnormality. No  evidence of acute traumatic injury to cervical spine. Post intubation and placement of enteric catheter. Debris within the trachea. Airspace consolidation in the visualized posterior aspect of the lung parenchyma, possibly due to aspiration. Electronically Signed   By: Fidela Salisbury M.D.   On: 06/30/2017 03:03   Portable Chest Xray  Result Date: 07/01/2017 CLINICAL DATA:  Respiratory failure EXAM: PORTABLE CHEST 1 VIEW COMPARISON:  06/30/2017 FINDINGS: Cardiac shadow is mildly enlarged but accentuated by the portable technique. Endotracheal tube, nasogastric catheter and right jugular central line are again noted and stable. The overall inspiratory effort is poor with bibasilar atelectatic changes. No new focal abnormality is noted. IMPRESSION: Stable bibasilar atelectatic changes. Tubes and lines as described. Electronically Signed   By: Inez Catalina M.D.   On: 07/01/2017 07:21   Dg Chest Port 1 View  Result Date: 06/30/2017 CLINICAL DATA:  Endotracheal tube EXAM: PORTABLE CHEST 1 VIEW COMPARISON:  06/30/2017 FINDINGS: Endotracheal tube 2 cm above the carina. Right jugular catheter tip in the SVC. NG tube in the stomach. Mild improvement bibasilar atelectasis. Continued hypoventilation. Negative for heart failure or effusion. No pneumothorax IMPRESSION: Mild improvement hypoventilation and bibasilar atelectasis. Support lines remain in satisfactory position. Electronically Signed   By: Franchot Gallo M.D.   On: 06/30/2017 09:17   Dg Chest Port 1 View  Result Date: 06/30/2017 CLINICAL DATA:  Status post advancement of ET tube today. EXAM: PORTABLE CHEST 1 VIEW COMPARISON:  Single-view of the chest earlier today. FINDINGS: Endotracheal tube has been advanced. The tip is just below the thoracic inlet and the tube should be advanced 1-2 cm for better positioning. Defibrillator pad has been removed. Right IJ catheter remains in place. Subsegmental atelectasis left mid lung noted. There is cardiomegaly.  IMPRESSION: ETT tip is just below the thoracic inlet and should be advanced 1-2 cm. No other change. Electronically Signed   By: Inge Rise M.D.   On: 06/30/2017 08:16   Dg Chest Port 1 View  Result Date: 06/30/2017 CLINICAL DATA:  Status post central line placement EXAM: PORTABLE CHEST 1 VIEW COMPARISON:  Chest x-ray and chest CT scan of earlier today FINDINGS: The lungs remain hypoinflated. The right internal jugular venous catheter tip projects over the junction of the proximal and middle thirds of the SVC. No postprocedure pneumothorax is observed. The cardiac silhouette is enlarged. The pulmonary vascularity is not clearly engorged. S sternal pacemaker defibrillator pads are present. The esophagogastric tube tip in proximal port project in the proximal gastric body. The endotracheal tube tip appears to be somewhat high in position approximately 2 cm above the superior margin of the clavicular heads at approximately 8.1 cm above the carina. Known right anterior rib fractures are not demonstrated on today's study. IMPRESSION: No postprocedure complication following right internal jugular venous catheter placement. High positioning of the endotracheal tube. Advancement by at least 5 cm is recommended. Good positioning of the esophagogastric tube. Bilateral hypoinflation. Cardiomegaly without pulmonary vascular congestion. Electronically Signed   By: David  Martinique M.D.   On: 06/30/2017 06:56   Dg Chest Portable 1 View  Addendum Date: 06/30/2017   ADDENDUM REPORT: 06/30/2017  05:12 ADDENDUM: These results were called by telephone at the time of interpretation on 06/30/2017 at 2:55 am to Dr. Charlesetta Ivory , who verbally acknowledged these results. Electronically Signed   By: Fidela Salisbury M.D.   On: 06/30/2017 05:12   Result Date: 06/30/2017 CLINICAL DATA:  Intubation. EXAM: PORTABLE CHEST 1 VIEW COMPARISON:  10/28/2016 FINDINGS: Endotracheal tube at the level of T1 vertebral body. Tortuosity of  the trachea at the cervicothoracic junction. Enteric catheter descends the abdomen. Shock pads overlie the chest. There is enlargement of the mediastinum which measures 12 cm. No evidence of pneumothorax. Low lung volumes. No focal airspace consolidation. Osseous structures are without acute abnormality. Soft tissues are grossly normal. IMPRESSION: Apparent widening of the mediastinum. This may be due to portable technique and low lung volumes, however acute aortic pathology cannot be excluded. If clinically warranted, CT angiogram of the chest should be considered. Endotracheal tube at the cervicothoracic junction. Advancement by 5 cm is recommended. Bowling of the tracheal air column at the cervicothoracic junction with uncertain significance. Electronically Signed: By: Fidela Salisbury M.D. On: 06/30/2017 02:50   Ct Angio Chest/abd/pel For Dissection W And/or Wo Contrast  Result Date: 06/30/2017 CLINICAL DATA:  Initial evaluation for acute cardiac arrest. EXAM: CT ANGIOGRAPHY CHEST, ABDOMEN AND PELVIS TECHNIQUE: Multidetector CT imaging through the chest, abdomen and pelvis was performed using the standard protocol during bolus administration of intravenous contrast. Multiplanar reconstructed images and MIPs were obtained and reviewed to evaluate the vascular anatomy. CONTRAST:  145m ISOVUE-370 IOPAMIDOL (ISOVUE-370) INJECTION 76% COMPARISON:  Prior radiograph from earlier same day. FINDINGS: CTA CHEST FINDINGS Cardiovascular: Precontrast imaging through the intrathoracic aorta at demonstrates no acute abnormality. Mild scattered atheromatous plaque within the aortic arch and descending intrathoracic aorta. Postcontrast imaging demonstrates no evidence for dissection or other acute abnormality. Partially visualized great vessels within normal limits. Cardiomegaly. No pericardial effusion. Limited evaluation of the pulmonary arterial tree grossly unremarkable. Mediastinum/Nodes: Thyroid normal. No enlarged  mediastinal, hilar, or axillary lymph nodes. No mediastinal hematoma. Enteric tube in place within the esophagus. Lungs/Pleura: Endotracheal tube in place. Layering secretions within the subglottic trachea extending into the right mainstem bronchus. Deep tendon atelectasis present within the lower lobes bilaterally. Lungs are otherwise largely clear. No other focal infiltrates. No pulmonary edema or pleural effusion. No pneumothorax. No worrisome pulmonary nodule or mass. Musculoskeletal: External soft tissues demonstrate no acute abnormality. Acute nondisplaced fractures of the left anterior third through sixth ribs, which may be related to chest compressions. No other acute osseus abnormality. No worrisome lytic or blastic osseous lesions. Review of the MIP images confirms the above findings. CTA ABDOMEN AND PELVIS FINDINGS VASCULAR Aorta: Intra-abdominal Eric or neural well opacified without evidence for dissection or other acute abnormality. No aneurysm. Moderate atherosclerosis. Celiac: Stenosis of approximately 40% present at the origin of the celiac. Celiac axis and its branch vessels well opacified distally. SMA: Atheromatous plaque at the proximal SMA without flow-limiting stenosis. SMA patent to its distal aspects. Renals: Single renal arteries present bilaterally. Atheromatous plaque about the origin of the renal artery is without high-grade stenosis. IMA: IMA patent to its distal aspect. Inflow: Iliac artery is widely patent bilaterally. Veins: No acute venous abnormality. Review of the MIP images confirms the above findings. NON-VASCULAR Hepatobiliary: The liver demonstrates no acute abnormality. Gallbladder within normal limits. No biliary dilatation. Pancreas: Pancreas within normal limits. Spleen: Spleen within normal limits. Adrenals/Urinary Tract: 2.7 cm heterogeneous right adrenal nodule, indeterminate. Adrenal glands otherwise unremarkable. Kidneys equal in  size with symmetric enhancement. Few  scattered renal cysts noted bilaterally, right greater than left. No nephrolithiasis or hydronephrosis. No focal enhancing renal mass. No hydroureter. Bladder largely decompressed with a Foley catheter in place. Stomach/Bowel: Enteric tube terminates within the stomach. No evidence for bowel obstruction. Normal appendix. No acute inflammatory changes seen about the bowels. Lymphatic: No adenopathy. Reproductive: Prostate normal. Other: No free air or fluid. Small fat containing paraumbilical hernia noted. Musculoskeletal: No acute osseus abnormality. No worrisome lytic or blastic osseous lesions. Mild scoliosis noted. Review of the MIP images confirms the above findings. IMPRESSION: 1. No CT evidence for dissection or other acute aortic pathology. 2. Acute nondisplaced fractures of the left anterior third through sixth ribs, which may be related to chest compressions given history of recent CPR. 3. Endotracheal and enteric tubes in place. Layering secretions within the subglottic trachea and right mainstem bronchus. Associated bibasilar atelectasis. 4. No other acute abnormality within the chest, abdomen, and pelvis. 5. 2.7 cm heterogeneous right adrenal nodule, indeterminate. Follow-up examination with nonemergent adrenal mass protocol CT and/or MRI suggested. Electronically Signed   By: Jeannine Boga M.D.   On: 06/30/2017 05:26     Assessment and Recommendation  61 y.o. male with acute non-ST elevation myocardial infarction with acute cardiac arrest status post CPR intubation and medication management treatment currently hemodynamically stable status post cardiac catheterization showing mild LV systolic dysfunction and significant two-vessel coronary artery disease but left anterior descending artery without critical stenosis not requiring further stenting or intervention at this time 1. Continue heparin for 48 more hours and then consider changing to aspirin and Plavix 2. No additional beta blocker  or ACE inhibitor for myocardial infarction due to hypotension 3. Supportive care for neurologic damage from acute cardiac arrest 4. Continue pulmonary supportive care 5. No further cardiac diagnostics necessary at this time  Signed, Serafina Royals M.D. FACC

## 2017-07-01 NOTE — Progress Notes (Signed)
ANTICOAGULATION CONSULT NOTE - Initial Consult  Pharmacy Consult for heparin Indication: chest pain/ACS s/p cardiac arrest  No Known Allergies  Patient Measurements: Height: _0  (180.3 cm) Weight: 235 lb 10.8 oz (106.9 kg) IBW/kg (Calculated) : 75.3 Heparin Dosing Weight: 97 kg  Vital Signs: Temp: 98.8 F (37.1 C) (12/05 1300) Temp Source: Rectal (12/05 1200) BP: 143/72 (12/05 1300) Pulse Rate: 100 (12/05 1300)  Labs: Recent Labs    06/30/17 0126 06/30/17 0600 06/30/17 1211 06/30/17 1532 06/30/17 1540 06/30/17 2212 07/01/17 0341 07/01/17 0638 07/01/17 1236  HGB 15.0 13.7  --   --   --   --  12.7*  --   --   HCT 44.2 39.5*  --   --   --   --  36.2*  --   --   PLT 282 241  --   --   --   --  152  --   --   APTT  --  27  --   --   --   --   --   --   --   LABPROT  --  14.4  --   --   --   --   --   --   --   INR  --  1.13  --   --   --   --   --   --   --   HEPARINUNFRC  --   --   --   --   --  0.29*  --  0.53 0.47  CREATININE 1.22 1.04  0.99  --  0.78  --   --  0.78  --   --   TROPONINI 0.09* 4.92* 4.19*  --  3.16* 2.18*  --   --   --     Estimated Creatinine Clearance: 122.1 mL/min (by C-G formula based on SCr of 0.78 mg/dL).   Medical History: Past Medical History:  Diagnosis Date  . Allergy   . Benign mass of parotid gland   . GERD (gastroesophageal reflux disease)   . Hyperlipidemia    not on meds  . Hypertension   . Vertigo     Medications:  Scheduled:  . artificial tears  1 application Both Eyes G2E  . aspirin  81 mg Oral Pre-Cath  . aspirin EC  81 mg Oral Daily  . atorvastatin  40 mg Per Tube q1800  . chlorhexidine gluconate (MEDLINE KIT)  15 mL Mouth Rinse BID  . Influenza vac split quadrivalent PF  0.5 mL Intramuscular Tomorrow-1000  . [START ON 07/02/2017] Influenza vac split quadrivalent PF  0.5 mL Intramuscular Tomorrow-1000  . insulin aspart  0-9 Units Subcutaneous Q4H  . ipratropium-albuterol  3 mL Nebulization Q6H  . mouth rinse  15  mL Mouth Rinse QID  . metoprolol tartrate  25 mg Per Tube BID  . pantoprazole (PROTONIX) IV  40 mg Intravenous Daily  . sodium chloride flush  10-40 mL Intracatheter Q12H  . sodium chloride flush  3 mL Intravenous Q12H  . sodium chloride flush  3 mL Intravenous Q12H    Assessment: Patient admitted for cardiac arrest trops up to 4.92. Patient is on TTM and is now s/p cath who will need CABG or PCI.  Goal of Therapy:  Heparin level 0.3-0.7 units/ml Monitor platelets by anticoagulation protocol: Yes   Plan:  Will resume heparin drip at 1100 units/hr with no bolus  2 hours after TR band deflation. Will check a HL 6  hours after resuming infusion.   12/5 @ 2212 HL 0.29 subtherapeutic. Will rebolus w/ heparin 1500 units IV x 1 and increase rate to 1250 units/hr and will recheck HL @ 0700 and CBC w/ am labs.  12/5 @ 0630 HL 0.53 therapeutic. Will continue current rate and will recheck @ 1230.  12/5 @ 1320. HL= 0.47. Will continue heparin infusion at current rate and f/u AM labs.   Ulice Dash, PharmD Clinical Pharmacist  07/01/2017

## 2017-07-01 NOTE — Plan of Care (Signed)
Pt remains cooling,, Vital signs as charted family at bedside

## 2017-07-01 NOTE — Progress Notes (Signed)
Jason Reef, RN called RT to room to assess patients ventilatory status. Patient very dyssynchronus with the ventilator and various attempts to meet patient demands were unsuccessful, patients saturations were maintained during this event with saturations in the upper 90's. RN called MD to bedside to assess patient due to this and the change in patients heart rhythm. MD decided to place patient back on paralytic., once this was done, patient synchrony with mechanical ventilation improved with airway pressure lowering back to baseline. Pt resting better after this, will continue to monitor.

## 2017-07-02 ENCOUNTER — Inpatient Hospital Stay: Payer: No Typology Code available for payment source

## 2017-07-02 DIAGNOSIS — J9601 Acute respiratory failure with hypoxia: Secondary | ICD-10-CM

## 2017-07-02 DIAGNOSIS — J690 Pneumonitis due to inhalation of food and vomit: Secondary | ICD-10-CM

## 2017-07-02 DIAGNOSIS — G9340 Encephalopathy, unspecified: Secondary | ICD-10-CM

## 2017-07-02 LAB — GLUCOSE, CAPILLARY
Glucose-Capillary: 106 mg/dL — ABNORMAL HIGH (ref 65–99)
Glucose-Capillary: 108 mg/dL — ABNORMAL HIGH (ref 65–99)
Glucose-Capillary: 112 mg/dL — ABNORMAL HIGH (ref 65–99)
Glucose-Capillary: 118 mg/dL — ABNORMAL HIGH (ref 65–99)
Glucose-Capillary: 118 mg/dL — ABNORMAL HIGH (ref 65–99)
Glucose-Capillary: 83 mg/dL (ref 65–99)
Glucose-Capillary: 91 mg/dL (ref 65–99)

## 2017-07-02 LAB — CULTURE, RESPIRATORY W GRAM STAIN: Culture: NORMAL

## 2017-07-02 LAB — CBC
HEMATOCRIT: 33.5 % — AB (ref 40.0–52.0)
Hemoglobin: 11.6 g/dL — ABNORMAL LOW (ref 13.0–18.0)
MCH: 29.5 pg (ref 26.0–34.0)
MCHC: 34.7 g/dL (ref 32.0–36.0)
MCV: 85 fL (ref 80.0–100.0)
Platelets: 145 10*3/uL — ABNORMAL LOW (ref 150–440)
RBC: 3.95 MIL/uL — ABNORMAL LOW (ref 4.40–5.90)
RDW: 13.6 % (ref 11.5–14.5)
WBC: 8.4 10*3/uL (ref 3.8–10.6)

## 2017-07-02 LAB — BASIC METABOLIC PANEL
Anion gap: 9 (ref 5–15)
BUN: 18 mg/dL (ref 6–20)
CO2: 21 mmol/L — AB (ref 22–32)
CREATININE: 0.62 mg/dL (ref 0.61–1.24)
Calcium: 7.2 mg/dL — ABNORMAL LOW (ref 8.9–10.3)
Chloride: 104 mmol/L (ref 101–111)
GFR calc non Af Amer: >60 mL/min (ref >60)
Glucose, Bld: 116 mg/dL — ABNORMAL HIGH (ref 65–99)
POTASSIUM: 4.1 mmol/L (ref 3.5–5.1)
SODIUM: 134 mmol/L — AB (ref 135–145)

## 2017-07-02 LAB — HEPARIN LEVEL (UNFRACTIONATED)
Heparin Unfractionated: 0.17 IU/mL — ABNORMAL LOW (ref 0.30–0.70)
Heparin Unfractionated: 0.42 IU/mL (ref 0.30–0.70)
Heparin Unfractionated: 0.29 IU/mL — ABNORMAL LOW (ref 0.30–0.70)

## 2017-07-02 LAB — TROPONIN I
TROPONIN I: 7.4 ng/mL — AB (ref <0.03)
TROPONIN I: 3.98 ng/mL — AB (ref <0.03)

## 2017-07-02 LAB — URINE CULTURE: Culture: NO GROWTH

## 2017-07-02 LAB — PROCALCITONIN: PROCALCITONIN: 1.02 ng/mL

## 2017-07-02 MED ORDER — FUROSEMIDE 10 MG/ML IJ SOLN
INTRAMUSCULAR | Status: AC
  Administered 2017-07-02: 40 mg via INTRAVENOUS
  Filled 2017-07-02: qty 4

## 2017-07-02 MED ORDER — NITROGLYCERIN IN D5W 200-5 MCG/ML-% IV SOLN
0.0000 ug/min | INTRAVENOUS | Status: DC
  Administered 2017-07-02: 25 ug/min via INTRAVENOUS

## 2017-07-02 MED ORDER — METOPROLOL TARTRATE 5 MG/5ML IV SOLN: 2.5000 mg | INTRAVENOUS | Status: DC | PRN

## 2017-07-02 MED ORDER — NITROGLYCERIN IN D5W 200-5 MCG/ML-% IV SOLN
INTRAVENOUS | Status: AC
  Administered 2017-07-02: 25 ug/min via INTRAVENOUS
  Filled 2017-07-02: qty 250

## 2017-07-02 MED ORDER — BISACODYL 10 MG RE SUPP: 10.0000 mg | Freq: Every day | RECTAL | Status: DC | PRN

## 2017-07-02 MED ORDER — VITAL HIGH PROTEIN PO LIQD
1000.0000 mL | ORAL | Status: DC
  Administered 2017-07-02: 1000 mL

## 2017-07-02 MED ORDER — ORAL CARE MOUTH RINSE
15.0000 mL | Freq: Four times a day (QID) | OROMUCOSAL | Status: DC
  Administered 2017-07-02 – 2017-07-04 (×7): 15 mL via OROMUCOSAL

## 2017-07-02 MED ORDER — MIDAZOLAM HCL 2 MG/2ML IJ SOLN
2.0000 mg | INTRAMUSCULAR | Status: DC | PRN
  Administered 2017-07-02 – 2017-07-03 (×4): 2 mg via INTRAVENOUS
  Filled 2017-07-02 (×4): qty 2

## 2017-07-02 MED ORDER — FENTANYL BOLUS VIA INFUSION
50.0000 ug | INTRAVENOUS | Status: DC | PRN
  Administered 2017-07-02 (×2): 50 ug via INTRAVENOUS
  Filled 2017-07-02: qty 50

## 2017-07-02 MED ORDER — ASPIRIN 81 MG PO CHEW: 81.0000 mg | CHEWABLE_TABLET | Freq: Every day | ORAL | Status: DC

## 2017-07-02 MED ORDER — HEPARIN BOLUS VIA INFUSION
1500.0000 [IU] | Freq: Once | INTRAVENOUS | Status: AC
  Administered 2017-07-02: 1500 [IU] via INTRAVENOUS
  Filled 2017-07-02: qty 1500

## 2017-07-02 MED ORDER — HEPARIN BOLUS VIA INFUSION
3000.0000 [IU] | Freq: Once | INTRAVENOUS | Status: AC
  Administered 2017-07-02: 3000 [IU] via INTRAVENOUS
  Filled 2017-07-02: qty 3000

## 2017-07-02 MED ORDER — FREE WATER
100.0000 mL | Freq: Three times a day (TID) | Status: DC
  Administered 2017-07-02 (×2): 100 mL

## 2017-07-02 MED ORDER — IPRATROPIUM-ALBUTEROL 0.5-2.5 (3) MG/3ML IN SOLN
3.0000 mL | RESPIRATORY_TRACT | Status: DC | PRN
Start: 2017-07-02 — End: 2017-07-07

## 2017-07-02 MED ORDER — VITAL HIGH PROTEIN PO LIQD: 1000.0000 mL | ORAL | Status: DC

## 2017-07-02 MED ORDER — NITROGLYCERIN 2 % TD OINT
1.0000 [in_us] | TOPICAL_OINTMENT | Freq: Three times a day (TID) | TRANSDERMAL | Status: DC
  Administered 2017-07-02 – 2017-07-05 (×9): 1 [in_us] via TOPICAL
  Filled 2017-07-02 (×10): qty 1

## 2017-07-02 MED ORDER — IPRATROPIUM-ALBUTEROL 0.5-2.5 (3) MG/3ML IN SOLN
3.0000 mL | Freq: Once | RESPIRATORY_TRACT | Status: AC
  Administered 2017-07-02: 3 mL via RESPIRATORY_TRACT

## 2017-07-02 MED ORDER — FAMOTIDINE 20 MG PO TABS
20.0000 mg | ORAL_TABLET | Freq: Every day | ORAL | Status: DC
  Administered 2017-07-02: 20 mg
  Filled 2017-07-02: qty 1

## 2017-07-02 MED ORDER — FUROSEMIDE 10 MG/ML IJ SOLN
40.0000 mg | Freq: Once | INTRAMUSCULAR | Status: AC
  Administered 2017-07-02: 40 mg via INTRAVENOUS

## 2017-07-02 MED ORDER — AMLODIPINE BESYLATE 10 MG PO TABS
10.0000 mg | ORAL_TABLET | Freq: Every day | ORAL | Status: DC
  Administered 2017-07-02: 10 mg
  Filled 2017-07-02: qty 1

## 2017-07-02 MED ORDER — SODIUM CHLORIDE 0.9 % IV BOLUS (SEPSIS)
500.0000 mL | Freq: Once | INTRAVENOUS | Status: AC
  Administered 2017-07-02: 500 mL via INTRAVENOUS

## 2017-07-02 MED ORDER — FENTANYL 2500MCG IN NS 250ML (10MCG/ML) PREMIX INFUSION
25.0000 ug/h | INTRAVENOUS | Status: DC
  Administered 2017-07-02: 350 ug/h via INTRAVENOUS
  Administered 2017-07-02: 250 ug/h via INTRAVENOUS
  Administered 2017-07-03: 350 ug/h via INTRAVENOUS
  Filled 2017-07-02 (×3): qty 250

## 2017-07-02 MED ORDER — DOCUSATE SODIUM 50 MG/5ML PO LIQD
100.0000 mg | Freq: Two times a day (BID) | ORAL | Status: DC | PRN
  Administered 2017-07-02: 100 mg
  Filled 2017-07-02 (×2): qty 10

## 2017-07-02 MED ORDER — FENTANYL CITRATE (PF) 100 MCG/2ML IJ SOLN
50.0000 ug | Freq: Once | INTRAMUSCULAR | Status: AC
  Administered 2017-07-02: 50 ug via INTRAVENOUS
  Filled 2017-07-02: qty 2

## 2017-07-02 MED ORDER — PRO-STAT SUGAR FREE PO LIQD
60.0000 mL | Freq: Four times a day (QID) | ORAL | Status: DC
  Administered 2017-07-02 (×3): 60 mL

## 2017-07-02 MED ORDER — POTASSIUM CHLORIDE 20 MEQ/15ML (10%) PO SOLN
40.0000 meq | ORAL | Status: DC
  Filled 2017-07-02 (×2): qty 30

## 2017-07-02 MED ORDER — METOPROLOL TARTRATE 5 MG/5ML IV SOLN
2.5000 mg | INTRAVENOUS | Status: DC | PRN
  Administered 2017-07-02: 2.5 mg via INTRAVENOUS
  Filled 2017-07-02: qty 5

## 2017-07-02 MED ORDER — METOPROLOL TARTRATE 50 MG PO TABS
50.0000 mg | ORAL_TABLET | Freq: Two times a day (BID) | ORAL | Status: DC
  Administered 2017-07-02: 50 mg
  Filled 2017-07-02: qty 1

## 2017-07-02 MED ORDER — CHLORHEXIDINE GLUCONATE 0.12% ORAL RINSE (MEDLINE KIT)
15.0000 mL | Freq: Two times a day (BID) | OROMUCOSAL | Status: DC
  Administered 2017-07-02 – 2017-07-04 (×4): 15 mL via OROMUCOSAL

## 2017-07-02 MED ORDER — POTASSIUM CHLORIDE 20 MEQ/15ML (10%) PO SOLN
80.0000 meq | Freq: Once | ORAL | Status: AC
  Administered 2017-07-02: 80 meq
  Filled 2017-07-02: qty 60

## 2017-07-02 NOTE — Progress Notes (Signed)
[facility] Cardiology [facility] Encounter Note  Patient: Jason Gross / Admit Date: 06/30/2017 / Date of Encounter: 07/02/2017, 8:41 AM   Subjective: Patient is still intubated status post what appears to be a non-ST elevation myocardial infarction Cardiac catheter shows mild LV systolic distant with occluded circumflex and right coronary artery which appeared to be old and have collateral blood flow from left anterior descending artery. Diagonal artery with some atherosclerosis and possible culprit lesion but small artery not amenable and not appropriate for PCI and stent placement. Left anterior descending artery with modest LAD stenosis and not requiring further intervention or bypass surgery Echocardiogram showing normal LV systolic function with ejection fraction of 60% and no evidence of the concerns for valvular heart disease Review of Systems:  Cannot assess Objective: Telemetry: Normal sinus rhythm Physical Exam: Blood pressure 106/65, pulse 61, temperature 98.6 F (37 C), temperature source Rectal, resp. rate 18, height 5\' 11"  (1.803 m), weight 106.9 kg (235 lb 10.8 oz), SpO2 100 %. Body mass index is 32.87 kg/m. General: Well developed, well nourished, in no acute distress. Head: Normocephalic, atraumatic, sclera non-icteric, no xanthomas, nares are without discharge. Neck: No apparent masses Lungs: Normal respirations with no wheezes, no rhonchi, no rales , no crackles   Heart: Regular rate and rhythm, normal S1 S2, no murmur, no rub, no gallop, PMI is normal size and placement, carotid upstroke normal without bruit, jugular venous pressure normal Abdomen: Soft, non-tender, non-distended with normoactive bowel sounds. No hepatosplenomegaly. Abdominal aorta is normal size without bruit Extremities: No edema, no clubbing, no cyanosis, no ulcers,  Peripheral: 2+ radial, 2+ femoral, 2+ dorsal pedal pulses Neuro: Not Alert and oriented. Does not Moves all extremities  spontaneously. Psych:  Does not Responds to questions appropriately with a normal affect.   Intake/Output Summary (Last 24 hours) at 07/02/2017 0841 Last data filed at 07/02/2017 0604 Gross per 24 hour  Intake 1742.86 ml  Output 625 ml  Net 1117.86 ml    Inpatient Medications:  . artificial tears  1 application Both Eyes K5L  . artificial tears  1 application Both Eyes [id number]  . aspirin EC  81 mg Oral Daily  . atorvastatin  40 mg Per Tube q1800  . feeding supplement (PRO-STAT SUGAR FREE 64)  30 mL Per Tube BID  . feeding supplement (VITAL HIGH PROTEIN)  1,000 mL Per Tube Q24H  . free water  200 mL Per Tube Q8H  . Influenza vac split quadrivalent PF  0.5 mL Intramuscular Tomorrow-1000  . insulin aspart  0-9 Units Subcutaneous Q4H  . ipratropium-albuterol  3 mL Nebulization Q6H  . mouth rinse  15 mL Mouth Rinse 10 times per day  . metoprolol tartrate  25 mg Per Tube BID  . midazolam  2 mg Intravenous Once  . nitroGLYCERIN  1 inch Topical Q8H  . pantoprazole (PROTONIX) IV  40 mg Intravenous Daily  . sodium chloride flush  10-40 mL Intracatheter Q12H  . sodium chloride flush  3 mL Intravenous Q12H  . sodium chloride flush  3 mL Intravenous Q12H   Infusions:  . sodium chloride    . sodium chloride    . cisatracurium (NIMBEX) infusion Stopped (07/02/17 0821)  . fentaNYL infusion INTRAVENOUS 300 mcg/hr (07/02/17 0600)  . heparin 1,450 Units/hr (07/02/17 0600)  . midazolam (VERSED) infusion 2.5 mg/hr (07/02/17 0821)  . piperacillin-tazobactam (ZOSYN)  IV 3.375 g (07/02/17 0604)    Labs: Recent Labs    06/30/17 0600  07/01/17 0341 07/01/17 1434  NA 136   < > 136 136  K 4.3   < > 3.5 3.6  CL 108   < > 105 103  CO2 23   < > 21* 23  GLUCOSE 171*   < > 135* 114*  BUN 14   < > 15 16  CREATININE 1.04  0.99   < > 0.78 0.78  CALCIUM 7.3*   < > 7.0* 7.3*  MG 1.6*  --   --   --   PHOS 3.3  --   --   --    < > = values in this interval not displayed.   Recent Labs     06/30/17 0126 06/30/17 0600  AST 74* 69*  ALT 68* 66*  ALKPHOS 51 36*  BILITOT 1.0 0.7  PROT 6.7 5.8*  ALBUMIN 3.7 3.2*   Recent Labs    07/01/17 0341 07/02/17 0401  WBC 10.0 8.4  HGB 12.7* 11.6*  HCT 36.2* 33.5*  MCV 84.3 85.0  PLT 152 145*   Recent Labs    06/30/17 1540 06/30/17 2212 07/01/17 1434 07/02/17 0401  TROPONINI 3.16* 2.18* 0.88* 7.40*   Invalid input(s): POCBNP Recent Labs    06/30/17 0600  HGBA1C 5.6     Weights: Filed Weights   06/30/17 0129 07/01/17 0500  Weight: 102.5 kg (226 lb) 106.9 kg (235 lb 10.8 oz)     Radiology/Studies:  Dg Abdomen 1 View  Result Date: 06/30/2017 CLINICAL DATA:  Orogastric tube placement. EXAM: ABDOMEN - 1 VIEW COMPARISON:  None. FINDINGS: The bowel gas pattern is normal. Orogastric tube at the expected location of gastric body. Shock pads overlies the lower thorax/upper abdomen. IMPRESSION: Orogastric tube at the expected location of gastric body. Electronically Signed   By: Fidela Salisbury M.D.   On: 06/30/2017 02:52   Ct Head Wo Contrast  Result Date: 06/30/2017 CLINICAL DATA:  Found unresponsive. EXAM: CT HEAD WITHOUT CONTRAST CT CERVICAL SPINE WITHOUT CONTRAST TECHNIQUE: Multidetector CT imaging of the head and cervical spine was performed following the standard protocol without intravenous contrast. Multiplanar CT image reconstructions of the cervical spine were also generated. COMPARISON:  None. FINDINGS: CT HEAD FINDINGS Brain: No evidence of acute infarction, hemorrhage, hydrocephalus, extra-axial collection or mass lesion/mass effect. Vascular: No hyperdense vessel or unexpected calcification. Calcific atherosclerotic disease of the intracavernous internal carotid arteries. Skull: Normal. Negative for fracture or focal lesion. Sinuses/Orbits: No acute finding. Other: None. CT CERVICAL SPINE FINDINGS Alignment: Stranding of the cervical lordosis, likely position. Skull base and vertebrae: No acute fracture. No  primary bone lesion or focal pathologic process. Soft tissues and spinal canal: No prevertebral fluid or swelling. No visible canal hematoma. Disc levels: Mild multilevel osteoarthritic changes of the cervical spine. Upper chest: Post intubation and placement of enteric catheter. Debris within the trachea. Airspace consolidation in the visualized posterior aspect of the lung parenchyma, possibly due to aspiration. Other: None. IMPRESSION: No acute intracranial abnormality. No evidence of acute traumatic injury to cervical spine. Post intubation and placement of enteric catheter. Debris within the trachea. Airspace consolidation in the visualized posterior aspect of the lung parenchyma, possibly due to aspiration. Electronically Signed   By: Fidela Salisbury M.D.   On: 06/30/2017 03:03   Ct Cervical Spine Wo Contrast  Result Date: 06/30/2017 CLINICAL DATA:  Found unresponsive. EXAM: CT HEAD WITHOUT CONTRAST CT CERVICAL SPINE WITHOUT CONTRAST TECHNIQUE: Multidetector CT imaging of the head and cervical spine was performed following the standard protocol without intravenous contrast. Multiplanar CT  image reconstructions of the cervical spine were also generated. COMPARISON:  None. FINDINGS: CT HEAD FINDINGS Brain: No evidence of acute infarction, hemorrhage, hydrocephalus, extra-axial collection or mass lesion/mass effect. Vascular: No hyperdense vessel or unexpected calcification. Calcific atherosclerotic disease of the intracavernous internal carotid arteries. Skull: Normal. Negative for fracture or focal lesion. Sinuses/Orbits: No acute finding. Other: None. CT CERVICAL SPINE FINDINGS Alignment: Stranding of the cervical lordosis, likely position. Skull base and vertebrae: No acute fracture. No primary bone lesion or focal pathologic process. Soft tissues and spinal canal: No prevertebral fluid or swelling. No visible canal hematoma. Disc levels: Mild multilevel osteoarthritic changes of the cervical spine.  Upper chest: Post intubation and placement of enteric catheter. Debris within the trachea. Airspace consolidation in the visualized posterior aspect of the lung parenchyma, possibly due to aspiration. Other: None. IMPRESSION: No acute intracranial abnormality. No evidence of acute traumatic injury to cervical spine. Post intubation and placement of enteric catheter. Debris within the trachea. Airspace consolidation in the visualized posterior aspect of the lung parenchyma, possibly due to aspiration. Electronically Signed   By: Fidela Salisbury M.D.   On: 06/30/2017 03:03   Portable Chest Xray  Result Date: 07/01/2017 CLINICAL DATA:  Respiratory failure EXAM: PORTABLE CHEST 1 VIEW COMPARISON:  06/30/2017 FINDINGS: Cardiac shadow is mildly enlarged but accentuated by the portable technique. Endotracheal tube, nasogastric catheter and right jugular central line are again noted and stable. The overall inspiratory effort is poor with bibasilar atelectatic changes. No new focal abnormality is noted. IMPRESSION: Stable bibasilar atelectatic changes. Tubes and lines as described. Electronically Signed   By: Inez Catalina M.D.   On: 07/01/2017 07:21   Dg Chest Port 1 View  Result Date: 06/30/2017 CLINICAL DATA:  Endotracheal tube EXAM: PORTABLE CHEST 1 VIEW COMPARISON:  06/30/2017 FINDINGS: Endotracheal tube 2 cm above the carina. Right jugular catheter tip in the SVC. NG tube in the stomach. Mild improvement bibasilar atelectasis. Continued hypoventilation. Negative for heart failure or effusion. No pneumothorax IMPRESSION: Mild improvement hypoventilation and bibasilar atelectasis. Support lines remain in satisfactory position. Electronically Signed   By: Franchot Gallo M.D.   On: 06/30/2017 09:17   Dg Chest Port 1 View  Result Date: 06/30/2017 CLINICAL DATA:  Status post advancement of ET tube today. EXAM: PORTABLE CHEST 1 VIEW COMPARISON:  Single-view of the chest earlier today. FINDINGS: Endotracheal tube  has been advanced. The tip is just below the thoracic inlet and the tube should be advanced 1-2 cm for better positioning. Defibrillator pad has been removed. Right IJ catheter remains in place. Subsegmental atelectasis left mid lung noted. There is cardiomegaly. IMPRESSION: ETT tip is just below the thoracic inlet and should be advanced 1-2 cm. No other change. Electronically Signed   By: Inge Rise M.D.   On: 06/30/2017 08:16   Dg Chest Port 1 View  Result Date: 06/30/2017 CLINICAL DATA:  Status post central line placement EXAM: PORTABLE CHEST 1 VIEW COMPARISON:  Chest x-ray and chest CT scan of earlier today FINDINGS: The lungs remain hypoinflated. The right internal jugular venous catheter tip projects over the junction of the proximal and middle thirds of the SVC. No postprocedure pneumothorax is observed. The cardiac silhouette is enlarged. The pulmonary vascularity is not clearly engorged. S sternal pacemaker defibrillator pads are present. The esophagogastric tube tip in proximal port project in the proximal gastric body. The endotracheal tube tip appears to be somewhat high in position approximately 2 cm above the superior margin of the clavicular heads  at approximately 8.1 cm above the carina. Known right anterior rib fractures are not demonstrated on today's study. IMPRESSION: No postprocedure complication following right internal jugular venous catheter placement. High positioning of the endotracheal tube. Advancement by at least 5 cm is recommended. Good positioning of the esophagogastric tube. Bilateral hypoinflation. Cardiomegaly without pulmonary vascular congestion. Electronically Signed   By: David  Martinique M.D.   On: 06/30/2017 06:56   Dg Chest Portable 1 View  Addendum Date: 06/30/2017   ADDENDUM REPORT: 06/30/2017 05:12 ADDENDUM: These results were called by telephone at the time of interpretation on 06/30/2017 at 2:55 am to Dr. Charlesetta Ivory , who verbally acknowledged these  results. Electronically Signed   By: Fidela Salisbury M.D.   On: 06/30/2017 05:12   Result Date: 06/30/2017 CLINICAL DATA:  Intubation. EXAM: PORTABLE CHEST 1 VIEW COMPARISON:  10/28/2016 FINDINGS: Endotracheal tube at the level of T1 vertebral body. Tortuosity of the trachea at the cervicothoracic junction. Enteric catheter descends the abdomen. Shock pads overlie the chest. There is enlargement of the mediastinum which measures 12 cm. No evidence of pneumothorax. Low lung volumes. No focal airspace consolidation. Osseous structures are without acute abnormality. Soft tissues are grossly normal. IMPRESSION: Apparent widening of the mediastinum. This may be due to portable technique and low lung volumes, however acute aortic pathology cannot be excluded. If clinically warranted, CT angiogram of the chest should be considered. Endotracheal tube at the cervicothoracic junction. Advancement by 5 cm is recommended. Bowling of the tracheal air column at the cervicothoracic junction with uncertain significance. Electronically Signed: By: Fidela Salisbury M.D. On: 06/30/2017 02:50   Ct Angio Chest/abd/pel For Dissection W And/or Wo Contrast  Result Date: 06/30/2017 CLINICAL DATA:  Initial evaluation for acute cardiac arrest. EXAM: CT ANGIOGRAPHY CHEST, ABDOMEN AND PELVIS TECHNIQUE: Multidetector CT imaging through the chest, abdomen and pelvis was performed using the standard protocol during bolus administration of intravenous contrast. Multiplanar reconstructed images and MIPs were obtained and reviewed to evaluate the vascular anatomy. CONTRAST:  188mL ISOVUE-370 IOPAMIDOL (ISOVUE-370) INJECTION 76% COMPARISON:  Prior radiograph from earlier same day. FINDINGS: CTA CHEST FINDINGS Cardiovascular: Precontrast imaging through the intrathoracic aorta at demonstrates no acute abnormality. Mild scattered atheromatous plaque within the aortic arch and descending intrathoracic aorta. Postcontrast imaging demonstrates  no evidence for dissection or other acute abnormality. Partially visualized great vessels within normal limits. Cardiomegaly. No pericardial effusion. Limited evaluation of the pulmonary arterial tree grossly unremarkable. Mediastinum/Nodes: Thyroid normal. No enlarged mediastinal, hilar, or axillary lymph nodes. No mediastinal hematoma. Enteric tube in place within the esophagus. Lungs/Pleura: Endotracheal tube in place. Layering secretions within the subglottic trachea extending into the right mainstem bronchus. Deep tendon atelectasis present within the lower lobes bilaterally. Lungs are otherwise largely clear. No other focal infiltrates. No pulmonary edema or pleural effusion. No pneumothorax. No worrisome pulmonary nodule or mass. Musculoskeletal: External soft tissues demonstrate no acute abnormality. Acute nondisplaced fractures of the left anterior third through sixth ribs, which may be related to chest compressions. No other acute osseus abnormality. No worrisome lytic or blastic osseous lesions. Review of the MIP images confirms the above findings. CTA ABDOMEN AND PELVIS FINDINGS VASCULAR Aorta: Intra-abdominal Eric or neural well opacified without evidence for dissection or other acute abnormality. No aneurysm. Moderate atherosclerosis. Celiac: Stenosis of approximately 40% present at the origin of the celiac. Celiac axis and its branch vessels well opacified distally. SMA: Atheromatous plaque at the proximal SMA without flow-limiting stenosis. SMA patent to its distal aspects. Renals: Single  renal arteries present bilaterally. Atheromatous plaque about the origin of the renal artery is without high-grade stenosis. IMA: IMA patent to its distal aspect. Inflow: Iliac artery is widely patent bilaterally. Veins: No acute venous abnormality. Review of the MIP images confirms the above findings. NON-VASCULAR Hepatobiliary: The liver demonstrates no acute abnormality. Gallbladder within normal limits. No  biliary dilatation. Pancreas: Pancreas within normal limits. Spleen: Spleen within normal limits. Adrenals/Urinary Tract: 2.7 cm heterogeneous right adrenal nodule, indeterminate. Adrenal glands otherwise unremarkable. Kidneys equal in size with symmetric enhancement. Few scattered renal cysts noted bilaterally, right greater than left. No nephrolithiasis or hydronephrosis. No focal enhancing renal mass. No hydroureter. Bladder largely decompressed with a Foley catheter in place. Stomach/Bowel: Enteric tube terminates within the stomach. No evidence for bowel obstruction. Normal appendix. No acute inflammatory changes seen about the bowels. Lymphatic: No adenopathy. Reproductive: Prostate normal. Other: No free air or fluid. Small fat containing paraumbilical hernia noted. Musculoskeletal: No acute osseus abnormality. No worrisome lytic or blastic osseous lesions. Mild scoliosis noted. Review of the MIP images confirms the above findings. IMPRESSION: 1. No CT evidence for dissection or other acute aortic pathology. 2. Acute nondisplaced fractures of the left anterior third through sixth ribs, which may be related to chest compressions given history of recent CPR. 3. Endotracheal and enteric tubes in place. Layering secretions within the subglottic trachea and right mainstem bronchus. Associated bibasilar atelectasis. 4. No other acute abnormality within the chest, abdomen, and pelvis. 5. 2.7 cm heterogeneous right adrenal nodule, indeterminate. Follow-up examination with nonemergent adrenal mass protocol CT and/or MRI suggested. Electronically Signed   By: Jeannine Boga M.D.   On: 06/30/2017 05:26     Assessment and Recommendation  61 y.o. male with acute non-ST elevation myocardial infarction with acute cardiac arrest status post CPR intubation and medication management treatment currently hemodynamically stable status post cardiac catheterization showing mild LV systolic dysfunction and significant  two-vessel coronary artery disease but left anterior descending artery without critical stenosis not requiring further stenting or intervention at this time 1. Continue heparin for 48 more hours and then consider changing to aspirin and Plavix when deemed appropriate as per intensive care 2. No additional beta blocker or ACE inhibitor for myocardial infarction due to hypotension but will consider addition as patient further recovers 3. Supportive care for neurologic damage from acute cardiac arrest and fully assess after abstinence of the paralyzing agents 4. Continue pulmonary supportive care 5. No further cardiac diagnostics necessary at this time  Signed, Serafina Royals M.D. FACC

## 2017-07-02 NOTE — Progress Notes (Signed)
ANTICOAGULATION CONSULT NOTE - FOLLOW UP  Consult  Pharmacy Consult for heparin Indication: chest pain/ACS s/p cardiac arrest  No Known Allergies  Patient Measurements: Height: '5\' 11"'  (180.3 cm) Weight: 235 lb 10.8 oz (106.9 kg) IBW/kg (Calculated) : 75.3 Heparin Dosing Weight: 97 kg  Vital Signs: Temp: 100.9 F (38.3 C) (12/06 1830) Temp Source: Rectal (12/06 1200) BP: 130/76 (12/06 1830) Pulse Rate: 94 (12/06 1830)  Labs: Recent Labs    06/30/17 0600  07/01/17 0341  07/01/17 1434 07/02/17 0401 07/02/17 1129 07/02/17 1539 07/02/17 1807  HGB 13.7  --  12.7*  --   --  11.6*  --   --   --   HCT 39.5*  --  36.2*  --   --  33.5*  --   --   --   PLT 241  --  152  --   --  145*  --   --   --   APTT 27  --   --   --   --   --   --   --   --   LABPROT 14.4  --   --   --   --   --   --   --   --   INR 1.13  --   --   --   --   --   --   --   --   HEPARINUNFRC  --    < >  --    < >  --  0.17* 0.42  --  0.29*  CREATININE 1.04  0.99   < > 0.78  --  0.78 0.62  --   --   --   TROPONINI 4.92*   < >  --   --  0.88* 7.40*  --  3.98*  --    < > = values in this interval not displayed.    Estimated Creatinine Clearance: 122.1 mL/min (by C-G formula based on SCr of 0.62 mg/dL).   Medical History: Past Medical History:  Diagnosis Date  . Allergy   . Benign mass of parotid gland   . GERD (gastroesophageal reflux disease)   . Hyperlipidemia    not on meds  . Hypertension   . Vertigo     Medications:  Scheduled:  . amLODipine  10 mg Per Tube Daily  . aspirin  81 mg Per Tube Daily  . atorvastatin  40 mg Per Tube q1800  . chlorhexidine gluconate (MEDLINE KIT)  15 mL Mouth Rinse BID  . famotidine  20 mg Per Tube Daily  . feeding supplement (PRO-STAT SUGAR FREE 64)  60 mL Per Tube QID  . feeding supplement (VITAL HIGH PROTEIN)  1,000 mL Per Tube Q24H  . free water  100 mL Per Tube Q8H  . heparin  1,500 Units Intravenous Once  . Influenza vac split quadrivalent PF  0.5 mL  Intramuscular Tomorrow-1000  . insulin aspart  0-9 Units Subcutaneous Q4H  . ipratropium-albuterol  3 mL Nebulization Q6H  . mouth rinse  15 mL Mouth Rinse QID  . metoprolol tartrate  50 mg Per Tube BID  . nitroGLYCERIN  1 inch Topical Q8H  . sodium chloride flush  10-40 mL Intracatheter Q12H    Assessment: Patient admitted for cardiac arrest trops up to 4.92. Patient is on TTM and is now s/p cath who will need CABG or PCI.  Goal of Therapy:  Heparin level 0.3-0.7 units/ml Monitor platelets by anticoagulation protocol: Yes  Plan:  Will resume heparin drip at 1100 units/hr with no bolus  2 hours after TR band deflation. Will check a HL 6 hours after resuming infusion.   12/5 @ 2212 HL 0.29 subtherapeutic. Will rebolus w/ heparin 1500 units IV x 1 and increase rate to 1250 units/hr and will recheck HL @ 0700 and CBC w/ am labs.  12/5 @ 0630 HL 0.53 therapeutic. Will continue current rate and will recheck @ 1230.  12/5 @ 1320. HL= 0.47. Will continue heparin infusion at current rate and f/u AM labs.   12/6 @ 0500 HL 0.17 subtherapeutic, per RN drip was never stopped. Will rebolus w/ heparin 3000 units IV x 1 and increase rate to 1450 units/hr and recheck HL @ 1100  12/6 @ 1130 HL 0.42. Continue heparin infusion at current rate and recheck a HL in 6 hours.   12/6: Heparin level resulted @ 0.29. Will give bolus of 1500 units x 1 and will increase heparin gtt rate to 1650 units/hr. Will recheck Heparin level @ 0200.   Ulice Dash, PharmD Clinical Pharmacist  07/02/2017

## 2017-07-02 NOTE — Progress Notes (Signed)
Troponin 7.4.  Notified [place], NP.

## 2017-07-02 NOTE — Progress Notes (Signed)
This RN made Dr. Alva Garnet aware that patient is having increased work of breathing.  MD came to bedside to assess patient.  No new orders given.  Vent wean in progress.  Son and Wife at bedside.

## 2017-07-02 NOTE — Progress Notes (Signed)
[facility] [place] NAME: Jasin Brazel    MR#:  [id number]  DATE OF BIRTH:  [birth year]  SUBJECTIVE:   Troponin is up this am with ischemic changes on tele May try for extubation  REVIEW OF SYSTEMS:    Intubated and sedated  Tolerating Diet: npo      DRUG ALLERGIES:  No Known Allergies  VITALS:  Blood pressure (!) 148/84, pulse 94, temperature 98.6 F (37 C), temperature source Rectal, resp. rate 16, height 5\' 11"  (1.803 m), weight 106.9 kg (235 lb 10.8 oz), SpO2 95 %.  PHYSICAL EXAMINATION:  Constitutional: Appears well-developed and well-nourished. No distress. Intubated HENT: Normocephalic. . Intubated Eyes: Conjunctivae  are normal.  no scleral icterus.  Neck: Normal ROM. Neck supple. No JVD. No tracheal deviation. CVS: RRR, S1/S2 +, no murmurs, no gallops, no carotid bruit.  Pulmonary: Effort and breath sounds normal, no stridor, rhonchi, wheezes, rales.  Abdominal: Soft. BS +,  no distension, tenderness, rebound or guarding.  Musculoskeletal: Normal range of motion. No edema and no tenderness.  Neuro: Sedated on VENT Skin: Skin is warm and dry. No rash noted. Psychiatric: Sedated.      LABORATORY PANEL:   CBC Recent Labs  Lab 07/02/17 0401  WBC 8.4  HGB 11.6*  HCT 33.5*  PLT 145*   ------------------------------------------------------------------------------------------------------------------  Chemistries  Recent Labs  Lab 06/30/17 0600  07/01/17 1434  NA 136   < > 136  K 4.3   < > 3.6  CL 108   < > 103  CO2 23   < > 23  GLUCOSE 171*   < > 114*  BUN 14   < > 16  CREATININE 1.04  0.99   < > 0.78  CALCIUM 7.3*   < > 7.3*  MG 1.6*  --   --   AST 69*  --   --   ALT 66*  --   --   ALKPHOS 36*  --   --   BILITOT 0.7  --   --    < > = values in this interval not displayed.   ------------------------------------------------------------------------------------------------------------------  Cardiac  Enzymes Recent Labs  Lab 06/30/17 2212 07/01/17 1434 07/02/17 0401  TROPONINI 2.18* 0.88* 7.40*   ------------------------------------------------------------------------------------------------------------------  RADIOLOGY:  Dg Chest Port 1 View  Result Date: 07/02/2017 CLINICAL DATA:  Respiratory failure EXAM: PORTABLE CHEST 1 VIEW COMPARISON:  07/01/2017 FINDINGS: Support devices are unchanged. Low lung volumes with vascular congestion and bibasilar atelectasis. No real change since prior study. IMPRESSION: Low lung volumes, vascular congestion and bibasilar atelectasis. Electronically Signed   By: Rolm Baptise M.D.   On: 07/02/2017 08:44   Portable Chest Xray  Result Date: 07/01/2017 CLINICAL DATA:  Respiratory failure EXAM: PORTABLE CHEST 1 VIEW COMPARISON:  06/30/2017 FINDINGS: Cardiac shadow is mildly enlarged but accentuated by the portable technique. Endotracheal tube, nasogastric catheter and right jugular central line are again noted and stable. The overall inspiratory effort is poor with bibasilar atelectatic changes. No new focal abnormality is noted. IMPRESSION: Stable bibasilar atelectatic changes. Tubes and lines as described. Electronically Signed   By: Inez Catalina M.D.   On: 07/01/2017 07:21     ASSESSMENT AND PLAN:   61 year old male with history of essential hypertension who presents after cardiac arrest.  1. Cardiac arrest due to non-ST elevation MI status post cardiac catheterization Cardiac catheter shows mild LV systolic distant with occluded circumflex and right coronary artery which appeared to be  old and have collateral blood flow from left anterior descending artery. Diagonal artery with some atherosclerosis and possible culprit lesion but small artery not amenable and not appropriate for PCI and stent placement. Left anterior descending artery with modest LAD stenosis and not requiring further intervention  Appreciate cardiology consultation Continue heparin  for total 48 hours then consider changing to aspirin and Plavix. No beta blocker or ACE inhibitor due to hypotension Given elevated troponin this am he will needs stent to diagonal once extubated.  2. Acute hypoxic respiratory failure due to cardiac arrest/aspiration pneumonia: Ventilator management as per ICU team. Plan to extubate today Continue Zosyn  3. Hyperglycemia:  A1c 5.6  Continue sliding scale  4. Acute metabolic encephalopathy due to cardiac arrest: Patient awakening slowly with wake up trial this am   Management plans discussed with Dr Alva Garnet CODE STATUS: [place] PATIENT:21 minutes.     POSSIBLE D/C ??, DEPENDING ON CLINICAL CONDITION.   Zonnique Norkus M.D on 07/02/2017 at 9:53 AM  Between 7am to 6pm - Pager - [phone] After 6pm go to [email or web address] - password [email or web address] [facility]  Office  [phone]  CC: Primary care physician; Venia Carbon, MD  Note: This dictation was prepared with Dragon dictation along with smaller phrase technology. Any transcriptional errors that result from this process are unintentional.

## 2017-07-02 NOTE — Progress Notes (Signed)
Attempted to titrate fentanyl drip down to 250 mcg/H from 300 mcg/H but patient began to start gagging against ET tube frequently therefore titrated drip back up to 300 mcg/H.

## 2017-07-02 NOTE — Progress Notes (Signed)
Notified [place], NP of patient's low UOP.

## 2017-07-02 NOTE — Progress Notes (Signed)
ANTICOAGULATION CONSULT NOTE - Initial Consult  Pharmacy Consult for heparin Indication: chest pain/ACS s/p cardiac arrest  No Known Allergies  Patient Measurements: Height: 5\' 11"  (180.3 cm) Weight: 235 lb 10.8 oz (106.9 kg) IBW/kg (Calculated) : 75.3 Heparin Dosing Weight: 97 kg  Vital Signs: Temp: 98.4 F (36.9 C) (12/06 0400) Temp Source: Rectal (12/06 0400) BP: 95/56 (12/06 0400) Pulse Rate: 67 (12/06 0400)  Labs: Recent Labs    06/30/17 0600  06/30/17 1532 06/30/17 1540  06/30/17 2212 07/01/17 0341 07/01/17 0638 07/01/17 1236 07/01/17 1434 07/02/17 0401  HGB 13.7  --   --   --   --   --  12.7*  --   --   --  11.6*  HCT 39.5*  --   --   --   --   --  36.2*  --   --   --  33.5*  PLT 241  --   --   --   --   --  152  --   --   --  145*  APTT 27  --   --   --   --   --   --   --   --   --   --   LABPROT 14.4  --   --   --   --   --   --   --   --   --   --   INR 1.13  --   --   --   --   --   --   --   --   --   --   HEPARINUNFRC  --   --   --   --    < > 0.29*  --  0.53 0.47  --  0.17*  CREATININE 1.04  0.99  --  0.78  --   --   --  0.78  --   --  0.78  --   TROPONINI 4.92*   < >  --  3.16*  --  2.18*  --   --   --  0.88*  --    < > = values in this interval not displayed.    Estimated Creatinine Clearance: 122.1 mL/min (by C-G formula based on SCr of 0.78 mg/dL).   Medical History: Past Medical History:  Diagnosis Date  . Allergy   . Benign mass of parotid gland   . GERD (gastroesophageal reflux disease)   . Hyperlipidemia    not on meds  . Hypertension   . Vertigo     Medications:  Scheduled:  . artificial tears  1 application Both Eyes Y6A  . artificial tears  1 application Both Eyes Y3K  . aspirin  81 mg Oral Pre-Cath  . aspirin EC  81 mg Oral Daily  . atorvastatin  40 mg Per Tube q1800  . feeding supplement (PRO-STAT SUGAR FREE 64)  30 mL Per Tube BID  . feeding supplement (VITAL HIGH PROTEIN)  1,000 mL Per Tube Q24H  . free water  200 mL  Per Tube Q8H  . heparin  3,000 Units Intravenous Once  . Influenza vac split quadrivalent PF  0.5 mL Intramuscular Tomorrow-1000  . insulin aspart  0-9 Units Subcutaneous Q4H  . ipratropium-albuterol  3 mL Nebulization Q6H  . mouth rinse  15 mL Mouth Rinse 10 times per day  . metoprolol tartrate  25 mg Per Tube BID  . midazolam  2 mg Intravenous Once  .  nitroGLYCERIN  1 inch Topical Q8H  . pantoprazole (PROTONIX) IV  40 mg Intravenous Daily  . sodium chloride flush  10-40 mL Intracatheter Q12H  . sodium chloride flush  3 mL Intravenous Q12H  . sodium chloride flush  3 mL Intravenous Q12H    Assessment: Patient admitted for cardiac arrest trops up to 4.92. Patient is on TTM and is now s/p cath who will need CABG or PCI.  Goal of Therapy:  Heparin level 0.3-0.7 units/ml Monitor platelets by anticoagulation protocol: Yes   Plan:  Will resume heparin drip at 1100 units/hr with no bolus  2 hours after TR band deflation. Will check a HL 6 hours after resuming infusion.   12/5 @ 2212 HL 0.29 subtherapeutic. Will rebolus w/ heparin 1500 units IV x 1 and increase rate to 1250 units/hr and will recheck HL @ 0700 and CBC w/ am labs.  12/5 @ 0630 HL 0.53 therapeutic. Will continue current rate and will recheck @ 1230.  12/5 @ 1320. HL= 0.47. Will continue heparin infusion at current rate and f/u AM labs.   12/6 @ 0500 HL 0.17 subtherapeutic, per RN drip was never stopped. Will rebolus w/ heparin 3000 units IV x 1 and increase rate to 1450 units/hr and recheck HL @ [place], PharmD, BCPS Clinical Pharmacist 07/02/2017

## 2017-07-02 NOTE — Progress Notes (Signed)
PULMONARY / CRITICAL CARE MEDICINE   Name: Jason Gross MRN: [id number] DOB: [birth year]    ADMISSION DATE:  06/30/2017  PT PROFILE:   48 M with minimal PMH admitted after out of hospital cardiac arrest (shockable rhythm - presumed VF). 10-15 mins of pulselessness. Wife administered chest compressions awaiting EMS. TTM protocol (36 degrees) initiated.   MAJOR EVENTS/TEST RESULTS: 12/04 Admission as above 12/04 CT head/neck: no acute findings 12/04 CTA chest/abd/pelvis: No CT evidence for dissection or other acute aortic pathology. Acute nondisplaced fractures of the left anterior third through sixth ribs, which may be related to chest compressions given history of recent CPR. Layering secretions within the subglottic trachea and right mainstem bronchus. Associated bibasilar atelectasis. No other acute abnormality within the chest, abdomen, and pelvis 12/04 LHC: Three-vessel coronary artery disease, including moderate diffuse mid and distal LAD disease, 95% D1 stenosis, and chronic total occlusions of the proximal/mid LCx and proximal/mid RCA. Suspect culprit vessel may have been D1, as other occlusions are well-collateralized and likely chronic. Med rx recommended 12/05 Echocardiogram: LVEF 65-70%. No regional wall motion abnormalities. No significant valvular abnormalities  12/05 Rewarming. Off vasopressors.  12/05 PM: Again developed severe ventilator dyssynchrony despite heavy level of sedation. NMB resumed. EKG with ischemic changes across the precordium. Metoprolol and NTG paste initiated 12/06 following commands.  Troponin I elevated. Tolerated SBT X 45 mins but developed agitation with hypertension, ischemic changes on EKG. NTG gtt initiated. Sedation resumed. Then NTG gtt stopped due to decreasing BP. Increasing respiratory secretions (mucopurulent)  INDWELLING DEVICES:: ETT 12/04 >>  R IJ CVL 12/04 >>  R radial A-line 12/04 >>   MICRO DATA: MRSA PCR 12/04 >> NEG Urine 12/04 >>  NEG Resp 12/04 >> NOF Blood 12/04 >>  resp 12/05 >>   ANTIMICROBIALS:  Pip-tazo 12/04 >>   SUBJECTIVE:  Able to follow commands.  Tolerated SBT for almost an hour but then developed agitation, labored breathing, hypertension, ischemic appearing changes on EKG.  Pressure support increased and sedation resumed.  VITAL SIGNS: BP (!) 146/76   Pulse 93   Temp (!) 101.1 F (38.4 C)   Resp 10   Ht 5\' 11"  (1.803 m)   Wt 106.9 kg (235 lb 10.8 oz)   SpO2 97%   BMI 32.87 kg/m   HEMODYNAMICS:    VENTILATOR SETTINGS: Vent Mode: PSV FiO2 (%):  [30 %] 30 % Set Rate:  [18 bmp] 18 bmp Vt Set:  [500 mL] 500 mL PEEP:  [5 cmH20] 5 cmH20 Pressure Support:  [5 cmH20-15 cmH20] 10 cmH20 Plateau Pressure:  [18 cmH20-19 cmH20] 19 cmH20  INTAKE / OUTPUT: I/O last 3 completed shifts: In: 3624.5 [I.V.:2639.5; NG/GT:685; IV Piggyback:300] Out: 1385 [Urine:1385]  PHYSICAL EXAMINATION: General: Intubated, sedated, RASS -3 to +2. + F/C Neuro: CNs intact, moves all extremities HEENT: NCAT, sclerae white Cardiovascular: Regular, no M noted Lungs: Bilateral rhonchi Abdomen: Soft, diminished bowel sounds Extremities: No edema  LABS:  BMET Recent Labs  Lab 07/01/17 0341 07/01/17 1434 07/02/17 0401  NA 136 136 134*  K 3.5 3.6 4.1  CL 105 103 104  CO2 21* 23 21*  BUN 15 16 18   CREATININE 0.78 0.78 0.62  GLUCOSE 135* 114* 116*    Electrolytes Recent Labs  Lab 06/30/17 0600  07/01/17 0341 07/01/17 1434 07/02/17 0401  CALCIUM 7.3*   < > 7.0* 7.3* 7.2*  MG 1.6*  --   --   --   --   PHOS 3.3  --   --   --   --    < > =  values in this interval not displayed.    CBC Recent Labs  Lab 06/30/17 0600 07/01/17 0341 07/02/17 0401  WBC 17.7* 10.0 8.4  HGB 13.7 12.7* 11.6*  HCT 39.5* 36.2* 33.5*  PLT 241 152 145*    Coag's Recent Labs  Lab 06/30/17 0600  APTT 27  INR 1.13    Sepsis Markers Recent Labs  Lab 06/30/17 0200 06/30/17 0600 07/01/17 0341 07/02/17 0401   LATICACIDVEN 5.0* 2.0*  --   --   PROCALCITON  --  0.60 1.34 1.02    ABG Recent Labs  Lab 06/30/17 0200 06/30/17 0610  PHART 7.28* 7.25*  PCO2ART 43 54*  PO2ART 92 233*    Liver Enzymes Recent Labs  Lab 06/30/17 0126 06/30/17 0600  AST 74* 69*  ALT 68* 66*  ALKPHOS 51 36*  BILITOT 1.0 0.7  ALBUMIN 3.7 3.2*    Cardiac Enzymes Recent Labs  Lab 07/01/17 1434 07/02/17 0401 07/02/17 1539  TROPONINI 0.88* 7.40* 3.98*    Glucose Recent Labs  Lab 07/01/17 2053 07/02/17 0002 07/02/17 0341 07/02/17 0744 07/02/17 1146 07/02/17 1548  GLUCAP 115* 106* 91 83 118* 112*    CXR: Low volumes with bilateral basilar atelectasis    ASSESSMENT / PLAN:  PULMONARY A: Ventilator dependent respiratory failure after cardiac arrest Suspected aspiration pneumonia Possibly mild pulmonary edema P:   Cont vent support - settings reviewed and/or adjusted PSV mode as tolerated Cont vent bundle Daily SBT Furosemide x1 dose 12/06  CARDIOVASCULAR A:  Cardiac arrest due to acute coronary syndrome NSTEMI Three-vessel coronary disease Hypertension P:  Continue ASA, heparin Metoprolol initiated 12/05.  Dose increased 12/06 Amlodipine initiated 12/06 Continue hydralazine as needed to maintain SBP <160 mmHg Metoprolol IV as needed to maintain HR <100/min Cardiology following -discussed with Dr. [name].  He reviewed cath films.  No intervention planned.  RENAL A:   Borderline hypokalemia P:   Monitor BMET intermittently Monitor I/Os Correct electrolytes as indicated   GASTROINTESTINAL A:   No issues P:   SUP: Enteral famotidine TF protocol initiated 12/06  HEMATOLOGIC A:   Mild ICU acquired anemia P:  DVT px: full dose heparin Monitor CBC intermittently Transfuse per usual guidelines   INFECTIOUS A:   Aspiration pneumonia P:   Monitor temp, WBC count Micro and abx as above   ENDOCRINE A:   Mild stress-induced hyperglycemia, resolved P:    Continue sensitive scale SSI  NEUROLOGIC A:   Anoxic encephalopathy after cardiac arrest -improving ICU/ventilator associated discomfort P:   RASS goal: -1, -2 Cont PAD protocol - fentanyl infusion with midozalam as needed NMB protocol discontinued 12/06   FAMILY  - Updates: Wife updated in detail at bedside   CCM time: 60 mins  The above time includes time spent in consultation with patient and/or family members and reviewing care plan on multidisciplinary rounds  Merton Border, MD PCCM service Mobile [phone] Pager [phone]    07/02/2017, 5:56 PM

## 2017-07-02 NOTE — Progress Notes (Signed)
ANTICOAGULATION CONSULT NOTE - Initial Consult  Pharmacy Consult for heparin Indication: chest pain/ACS s/p cardiac arrest  No Known Allergies  Patient Measurements: Height: _0  (180.3 cm) Weight: 235 lb 10.8 oz (106.9 kg) IBW/kg (Calculated) : 75.3 Heparin Dosing Weight: 97 kg  Vital Signs: Temp: 100 F (37.8 C) (12/06 1200) Temp Source: Rectal (12/06 1200) BP: 136/87 (12/06 1200) Pulse Rate: 108 (12/06 1200)  Labs: Recent Labs    06/30/17 0600  06/30/17 2212 07/01/17 0341  07/01/17 1236 07/01/17 1434 07/02/17 0401 07/02/17 1129  HGB 13.7  --   --  12.7*  --   --   --  11.6*  --   HCT 39.5*  --   --  36.2*  --   --   --  33.5*  --   PLT 241  --   --  152  --   --   --  145*  --   APTT 27  --   --   --   --   --   --   --   --   LABPROT 14.4  --   --   --   --   --   --   --   --   INR 1.13  --   --   --   --   --   --   --   --   HEPARINUNFRC  --   --  0.29*  --    < > 0.47  --  0.17* 0.42  CREATININE 1.04  0.99   < >  --  0.78  --   --  0.78 0.62  --   TROPONINI 4.92*   < > 2.18*  --   --   --  0.88* 7.40*  --    < > = values in this interval not displayed.    Estimated Creatinine Clearance: 122.1 mL/min (by C-G formula based on SCr of 0.62 mg/dL).   Medical History: Past Medical History:  Diagnosis Date  . Allergy   . Benign mass of parotid gland   . GERD (gastroesophageal reflux disease)   . Hyperlipidemia    not on meds  . Hypertension   . Vertigo     Medications:  Scheduled:  . amLODipine  10 mg Per Tube Daily  . aspirin  81 mg Per Tube Daily  . atorvastatin  40 mg Per Tube q1800  . chlorhexidine gluconate (MEDLINE KIT)  15 mL Mouth Rinse BID  . famotidine  20 mg Per Tube Daily  . feeding supplement (PRO-STAT SUGAR FREE 64)  60 mL Per Tube QID  . feeding supplement (VITAL HIGH PROTEIN)  1,000 mL Per Tube Q24H  . free water  100 mL Per Tube Q8H  . Influenza vac split quadrivalent PF  0.5 mL Intramuscular Tomorrow-1000  . insulin aspart  0-9  Units Subcutaneous Q4H  . ipratropium-albuterol  3 mL Nebulization Q6H  . mouth rinse  15 mL Mouth Rinse QID  . metoprolol tartrate  50 mg Per Tube BID  . sodium chloride flush  10-40 mL Intracatheter Q12H    Assessment: Patient admitted for cardiac arrest trops up to 4.92. Patient is on TTM and is now s/p cath who will need CABG or PCI.  Goal of Therapy:  Heparin level 0.3-0.7 units/ml Monitor platelets by anticoagulation protocol: Yes   Plan:  Will resume heparin drip at 1100 units/hr with no bolus  2 hours after TR band deflation. Will  check a HL 6 hours after resuming infusion.   12/5 @ 2212 HL 0.29 subtherapeutic. Will rebolus w/ heparin 1500 units IV x 1 and increase rate to 1250 units/hr and will recheck HL @ 0700 and CBC w/ am labs.  12/5 @ 0630 HL 0.53 therapeutic. Will continue current rate and will recheck @ 1230.  12/5 @ 1320. HL= 0.47. Will continue heparin infusion at current rate and f/u AM labs.   12/6 @ 0500 HL 0.17 subtherapeutic, per RN drip was never stopped. Will rebolus w/ heparin 3000 units IV x 1 and increase rate to 1450 units/hr and recheck HL @ 1100  12/6 @ 1130 HL 0.42. Continue heparin infusion at current rate and recheck a HL in 6 hours.   Ulice Dash, PharmD Clinical Pharmacist  07/02/2017

## 2017-07-02 NOTE — Progress Notes (Signed)
Increased to 15/5 and 30% per Dr. Alva Garnet request, orders for patient to remain on PSV as tolerated today.

## 2017-07-02 NOTE — Progress Notes (Signed)
Patient placed on PSV 5/5 @ 30% by Dr. Leonidas Romberg

## 2017-07-03 ENCOUNTER — Inpatient Hospital Stay: Payer: No Typology Code available for payment source

## 2017-07-03 LAB — BASIC METABOLIC PANEL
Anion gap: 7 (ref 5–15)
BUN: 29 mg/dL — ABNORMAL HIGH (ref 6–20)
CO2: 29 mmol/L (ref 22–32)
Calcium: 7.5 mg/dL — ABNORMAL LOW (ref 8.9–10.3)
Chloride: 100 mmol/L — ABNORMAL LOW (ref 101–111)
Creatinine, Ser: 0.74 mg/dL (ref 0.61–1.24)
GFR calc Af Amer: >60 mL/min (ref >60)
GFR calc non Af Amer: >60 mL/min (ref >60)
Glucose, Bld: 113 mg/dL — ABNORMAL HIGH (ref 65–99)
Potassium: 4.2 mmol/L (ref 3.5–5.1)
Sodium: 136 mmol/L (ref 135–145)

## 2017-07-03 LAB — CULTURE, RESPIRATORY: SPECIAL REQUESTS: NORMAL

## 2017-07-03 LAB — GLUCOSE, CAPILLARY
Glucose-Capillary: 104 mg/dL — ABNORMAL HIGH (ref 65–99)
Glucose-Capillary: 109 mg/dL — ABNORMAL HIGH (ref 65–99)
Glucose-Capillary: 110 mg/dL — ABNORMAL HIGH (ref 65–99)
Glucose-Capillary: 111 mg/dL — ABNORMAL HIGH (ref 65–99)
Glucose-Capillary: 116 mg/dL — ABNORMAL HIGH (ref 65–99)
Glucose-Capillary: 118 mg/dL — ABNORMAL HIGH (ref 65–99)

## 2017-07-03 LAB — CBC
HCT: 33.2 % — ABNORMAL LOW (ref 40.0–52.0)
Hemoglobin: 11.5 g/dL — ABNORMAL LOW (ref 13.0–18.0)
MCH: 29.4 pg (ref 26.0–34.0)
MCHC: 34.5 g/dL (ref 32.0–36.0)
MCV: 85.2 fL (ref 80.0–100.0)
PLATELETS: 181 10*3/uL (ref 150–440)
RBC: 3.9 MIL/uL — AB (ref 4.40–5.90)
RDW: 13.8 % (ref 11.5–14.5)
WBC: 9.9 10*3/uL (ref 3.8–10.6)

## 2017-07-03 LAB — HEPARIN LEVEL (UNFRACTIONATED)
HEPARIN UNFRACTIONATED: 0.44 [IU]/mL (ref 0.30–0.70)
HEPARIN UNFRACTIONATED: 0.1 [IU]/mL — AB (ref 0.30–0.70)
Heparin Unfractionated: 0.49 IU/mL (ref 0.30–0.70)

## 2017-07-03 LAB — CULTURE, RESPIRATORY W GRAM STAIN: Culture: NORMAL

## 2017-07-03 LAB — TROPONIN I: Troponin I: 4.32 ng/mL (ref <0.03)

## 2017-07-03 MED ORDER — GUAIFENESIN-CODEINE 100-10 MG/5ML PO SOLN
10.0000 mL | ORAL | Status: DC | PRN
  Administered 2017-07-03 – 2017-07-06 (×5): 10 mL via ORAL
  Filled 2017-07-03 (×6): qty 10

## 2017-07-03 MED ORDER — PHENOL 1.4 % MT LIQD
1.0000 | OROMUCOSAL | Status: DC | PRN
  Filled 2017-07-03: qty 177

## 2017-07-03 MED ORDER — ASPIRIN EC 81 MG PO TBEC
81.0000 mg | DELAYED_RELEASE_TABLET | Freq: Every day | ORAL | Status: DC
  Administered 2017-07-03 – 2017-07-07 (×5): 81 mg via ORAL
  Filled 2017-07-03 (×5): qty 1

## 2017-07-03 MED ORDER — FAMOTIDINE 20 MG PO TABS
20.0000 mg | ORAL_TABLET | Freq: Every day | ORAL | Status: DC
  Administered 2017-07-04 – 2017-07-07 (×4): 20 mg via ORAL
  Filled 2017-07-03 (×4): qty 1

## 2017-07-03 MED ORDER — FENTANYL 12 MCG/HR TD PT72
12.5000 ug | MEDICATED_PATCH | TRANSDERMAL | Status: DC
  Administered 2017-07-03: 12.5 ug via TRANSDERMAL
  Filled 2017-07-03: qty 1

## 2017-07-03 MED ORDER — AMLODIPINE BESYLATE 10 MG PO TABS
10.0000 mg | ORAL_TABLET | Freq: Every day | ORAL | Status: DC
  Administered 2017-07-03 – 2017-07-05 (×3): 10 mg via ORAL
  Filled 2017-07-03 (×3): qty 1

## 2017-07-03 MED ORDER — DEXTROSE-NACL 5-0.45 % IV SOLN
INTRAVENOUS | Status: DC
  Administered 2017-07-03 – 2017-07-05 (×2): via INTRAVENOUS

## 2017-07-03 MED ORDER — PANTOPRAZOLE SODIUM 40 MG IV SOLR
40.0000 mg | Freq: Two times a day (BID) | INTRAVENOUS | Status: DC
  Administered 2017-07-03 – 2017-07-05 (×5): 40 mg via INTRAVENOUS
  Filled 2017-07-03 (×5): qty 40

## 2017-07-03 MED ORDER — DEXMEDETOMIDINE HCL IN NACL 400 MCG/100ML IV SOLN
0.4000 ug/kg/h | INTRAVENOUS | Status: DC
  Administered 2017-07-03: 1.2 ug/kg/h via INTRAVENOUS
  Administered 2017-07-03: 0.6 ug/kg/h via INTRAVENOUS
  Filled 2017-07-03 (×2): qty 100

## 2017-07-03 MED ORDER — ATORVASTATIN CALCIUM 20 MG PO TABS
40.0000 mg | ORAL_TABLET | Freq: Every day | ORAL | Status: DC
  Administered 2017-07-04 – 2017-07-06 (×3): 40 mg via ORAL
  Filled 2017-07-03 (×4): qty 2

## 2017-07-03 MED ORDER — FUROSEMIDE 10 MG/ML IJ SOLN
40.0000 mg | Freq: Once | INTRAMUSCULAR | Status: AC
  Administered 2017-07-03: 40 mg via INTRAVENOUS
  Filled 2017-07-03: qty 4

## 2017-07-03 MED ORDER — MORPHINE SULFATE (PF) 2 MG/ML IV SOLN
1.0000 mg | INTRAVENOUS | Status: DC | PRN
  Administered 2017-07-03 – 2017-07-06 (×9): 2 mg via INTRAVENOUS
  Filled 2017-07-03 (×9): qty 1

## 2017-07-03 MED ORDER — METOPROLOL TARTRATE 50 MG PO TABS
50.0000 mg | ORAL_TABLET | Freq: Two times a day (BID) | ORAL | Status: DC
  Administered 2017-07-03 – 2017-07-07 (×8): 50 mg via ORAL
  Filled 2017-07-03 (×8): qty 1

## 2017-07-03 NOTE — Progress Notes (Signed)
Pt Gagging on tube - episode of emesis.  NG to suction - 1L obtained - light green.

## 2017-07-03 NOTE — Progress Notes (Addendum)
Pharmacy Antibiotic Note  Jason Gross is a 61 y.o. male admitted on 06/30/2017 with pneumonia.  Pharmacy has been consulted for zosyn dosing.  Plan: Continue Zosyn 3.375 g EI q 8 hours. Patient is on day 4 of therapy with negative cultures. Will clarify planned duration of therapy with CCM.  Addendum: After discussion with Dr. Jefferson Fuel, will continue Zosyn for a total of 7 days of therapy.   Height: 5\' 11"  (180.3 cm) Weight: 226 lb 13.7 oz (102.9 kg) IBW/kg (Calculated) : 75.3  Temp (24hrs), Avg:100.7 F (38.2 C), Min:99 F (37.2 C), Max:101.5 F (38.6 C)  Recent Labs  Lab 06/30/17 0126 06/30/17 0200 06/30/17 0600 06/30/17 1532 07/01/17 0341 07/01/17 1434 07/02/17 0401 07/03/17 0136 07/03/17 0430  WBC 17.6*  --  17.7*  --  10.0  --  8.4 9.9  --   CREATININE 1.22  --  1.04  0.99 0.78 0.78 0.78 0.62  --  0.74  LATICACIDVEN  --  5.0* 2.0*  --   --   --   --   --   --     Estimated Creatinine Clearance: 119.9 mL/min (by C-G formula based on SCr of 0.74 mg/dL).    No Known Allergies   Thank you for allowing pharmacy to be a part of this patient's care.  Ulice Dash, PharmD Clinical Pharmacist  07/03/2017

## 2017-07-03 NOTE — Progress Notes (Signed)
Placed on high fowlers position, cuff deflated, suctioned orally and endotracheally and then extuabted to 3 lpm O2 [place]

## 2017-07-03 NOTE — Progress Notes (Signed)
[facility] Encounter Note  Patient: Jason Gross / Admit Date: 06/30/2017 / Date of Encounter: 07/03/2017, 8:10 AM   Subjective: Patient is still intubated status post what appears to be a non-ST elevation myocardial infarction with anterior st depression by ecg consisitant with continued diagonal and or global ischemia on the 5th. No further ischemia changes despite higher bp and more breathing stress over las 24 hours after continued heparin and ntg tx. More hemodynamically stable today Cardiac catheter shows mild LV systolic distant with occluded circumflex and right coronary artery which appeared to be old and have collateral blood flow from left anterior descending artery. Diagonal artery with some atherosclerosis and possible culprit lesion but small artery not amenable and not appropriate for PCI and stent placement. Left anterior descending artery with modest LAD stenosis and not requiring further intervention or bypass surgery Echocardiogram showing normal LV systolic function with ejection fraction of 60% and no evidence of the concerns for valvular heart disease Review of Systems:  Cannot assess Objective: Telemetry: Normal sinus rhythm Physical Exam: Blood pressure 131/71, pulse 71, temperature 100.2 F (37.9 C), resp. rate 18, height '5\' 11"'  (1.803 m), weight 102.9 kg (226 lb 13.7 oz), SpO2 97 %. Body mass index is 31.64 kg/m. General: Well developed, well nourished, in no acute distress. Head: Normocephalic, atraumatic, sclera non-icteric, no xanthomas, nares are without discharge. Neck: No apparent masses Lungs: Normal respirations with no wheezes, no rhonchi, no rales , no crackles   Heart: Regular rate and rhythm, normal S1 S2, no murmur, no rub, no gallop, PMI is normal size and placement, carotid upstroke normal without bruit, jugular venous pressure normal Abdomen: Soft, non-tender, non-distended with normoactive bowel sounds. No hepatosplenomegaly.  Abdominal aorta is normal size without bruit Extremities: No edema, no clubbing, no cyanosis, no ulcers,  Peripheral: 2+ radial, 2+ femoral, 2+ dorsal pedal pulses Neuro: Not Alert and oriented. Does not Moves all extremities spontaneously. Psych:  Does not Responds to questions appropriately with a normal affect.   Intake/Output Summary (Last 24 hours) at 07/03/2017 0810 Last data filed at 07/03/2017 0600 Gross per 24 hour  Intake 2301.91 ml  Output 4560 ml  Net -2258.09 ml    Inpatient Medications:  . amLODipine  10 mg Per Tube Daily  . aspirin  81 mg Per Tube Daily  . atorvastatin  40 mg Per Tube q1800  . chlorhexidine gluconate (MEDLINE KIT)  15 mL Mouth Rinse BID  . famotidine  20 mg Per Tube Daily  . feeding supplement (PRO-STAT SUGAR FREE 64)  60 mL Per Tube QID  . feeding supplement (VITAL HIGH PROTEIN)  1,000 mL Per Tube Q24H  . free water  100 mL Per Tube Q8H  . Influenza vac split quadrivalent PF  0.5 mL Intramuscular Tomorrow-1000  . insulin aspart  0-9 Units Subcutaneous Q4H  . ipratropium-albuterol  3 mL Nebulization Q6H  . mouth rinse  15 mL Mouth Rinse QID  . metoprolol tartrate  50 mg Per Tube BID  . nitroGLYCERIN  1 inch Topical Q8H  . sodium chloride flush  10-40 mL Intracatheter Q12H   Infusions:  . dexmedetomidine (PRECEDEX) IV infusion 1.2 mcg/kg/hr (07/03/17 0750)  . fentaNYL infusion INTRAVENOUS 200 mcg/hr (07/03/17 0806)  . heparin 1,650 Units/hr (07/03/17 0600)  . piperacillin-tazobactam (ZOSYN)  IV 3.375 g (07/03/17 0522)    Labs: Recent Labs    07/02/17 0401 07/03/17 0430  NA 134* 136  K 4.1 4.2  CL 104 100*  CO2  21* 29  GLUCOSE 116* 113*  BUN 18 29*  CREATININE 0.62 0.74  CALCIUM 7.2* 7.5*   No results for input(s): AST, ALT, ALKPHOS, BILITOT, PROT, ALBUMIN in the last 72 hours. Recent Labs    07/02/17 0401 07/03/17 0136  WBC 8.4 9.9  HGB 11.6* 11.5*  HCT 33.5* 33.2*  MCV 85.0 85.2  PLT 145* 181   Recent Labs     07/01/17 1434 07/02/17 0401 07/02/17 1539 07/03/17 0430  TROPONINI 0.88* 7.40* 3.98* 4.32*   Invalid input(s): POCBNP No results for input(s): HGBA1C in the last 72 hours.   Weights: Filed Weights   07/01/17 0500 07/02/17 2200 07/03/17 0358  Weight: 106.9 kg (235 lb 10.8 oz) 102 kg (224 lb 13.9 oz) 102.9 kg (226 lb 13.7 oz)     Radiology/Studies:  Dg Abdomen 1 View  Result Date: 06/30/2017 CLINICAL DATA:  Orogastric tube placement. EXAM: ABDOMEN - 1 VIEW COMPARISON:  None. FINDINGS: The bowel gas pattern is normal. Orogastric tube at the expected location of gastric body. Shock pads overlies the lower thorax/upper abdomen. IMPRESSION: Orogastric tube at the expected location of gastric body. Electronically Signed   By: Fidela Salisbury M.D.   On: 06/30/2017 02:52   Ct Head Wo Contrast  Result Date: 06/30/2017 CLINICAL DATA:  Found unresponsive. EXAM: CT HEAD WITHOUT CONTRAST CT CERVICAL SPINE WITHOUT CONTRAST TECHNIQUE: Multidetector CT imaging of the head and cervical spine was performed following the standard protocol without intravenous contrast. Multiplanar CT image reconstructions of the cervical spine were also generated. COMPARISON:  None. FINDINGS: CT HEAD FINDINGS Brain: No evidence of acute infarction, hemorrhage, hydrocephalus, extra-axial collection or mass lesion/mass effect. Vascular: No hyperdense vessel or unexpected calcification. Calcific atherosclerotic disease of the intracavernous internal carotid arteries. Skull: Normal. Negative for fracture or focal lesion. Sinuses/Orbits: No acute finding. Other: None. CT CERVICAL SPINE FINDINGS Alignment: Stranding of the cervical lordosis, likely position. Skull base and vertebrae: No acute fracture. No primary bone lesion or focal pathologic process. Soft tissues and spinal canal: No prevertebral fluid or swelling. No visible canal hematoma. Disc levels: Mild multilevel osteoarthritic changes of the cervical spine. Upper chest:  Post intubation and placement of enteric catheter. Debris within the trachea. Airspace consolidation in the visualized posterior aspect of the lung parenchyma, possibly due to aspiration. Other: None. IMPRESSION: No acute intracranial abnormality. No evidence of acute traumatic injury to cervical spine. Post intubation and placement of enteric catheter. Debris within the trachea. Airspace consolidation in the visualized posterior aspect of the lung parenchyma, possibly due to aspiration. Electronically Signed   By: Fidela Salisbury M.D.   On: 06/30/2017 03:03   Ct Cervical Spine Wo Contrast  Result Date: 06/30/2017 CLINICAL DATA:  Found unresponsive. EXAM: CT HEAD WITHOUT CONTRAST CT CERVICAL SPINE WITHOUT CONTRAST TECHNIQUE: Multidetector CT imaging of the head and cervical spine was performed following the standard protocol without intravenous contrast. Multiplanar CT image reconstructions of the cervical spine were also generated. COMPARISON:  None. FINDINGS: CT HEAD FINDINGS Brain: No evidence of acute infarction, hemorrhage, hydrocephalus, extra-axial collection or mass lesion/mass effect. Vascular: No hyperdense vessel or unexpected calcification. Calcific atherosclerotic disease of the intracavernous internal carotid arteries. Skull: Normal. Negative for fracture or focal lesion. Sinuses/Orbits: No acute finding. Other: None. CT CERVICAL SPINE FINDINGS Alignment: Stranding of the cervical lordosis, likely position. Skull base and vertebrae: No acute fracture. No primary bone lesion or focal pathologic process. Soft tissues and spinal canal: No prevertebral fluid or swelling. No visible canal hematoma. Disc  levels: Mild multilevel osteoarthritic changes of the cervical spine. Upper chest: Post intubation and placement of enteric catheter. Debris within the trachea. Airspace consolidation in the visualized posterior aspect of the lung parenchyma, possibly due to aspiration. Other: None. IMPRESSION: No  acute intracranial abnormality. No evidence of acute traumatic injury to cervical spine. Post intubation and placement of enteric catheter. Debris within the trachea. Airspace consolidation in the visualized posterior aspect of the lung parenchyma, possibly due to aspiration. Electronically Signed   By: Fidela Salisbury M.D.   On: 06/30/2017 03:03   Dg Chest Port 1 View  Result Date: 07/02/2017 CLINICAL DATA:  Respiratory failure EXAM: PORTABLE CHEST 1 VIEW COMPARISON:  07/01/2017 FINDINGS: Support devices are unchanged. Low lung volumes with vascular congestion and bibasilar atelectasis. No real change since prior study. IMPRESSION: Low lung volumes, vascular congestion and bibasilar atelectasis. Electronically Signed   By: Rolm Baptise M.D.   On: 07/02/2017 08:44   Portable Chest Xray  Result Date: 07/01/2017 CLINICAL DATA:  Respiratory failure EXAM: PORTABLE CHEST 1 VIEW COMPARISON:  06/30/2017 FINDINGS: Cardiac shadow is mildly enlarged but accentuated by the portable technique. Endotracheal tube, nasogastric catheter and right jugular central line are again noted and stable. The overall inspiratory effort is poor with bibasilar atelectatic changes. No new focal abnormality is noted. IMPRESSION: Stable bibasilar atelectatic changes. Tubes and lines as described. Electronically Signed   By: Inez Catalina M.D.   On: 07/01/2017 07:21   Dg Chest Port 1 View  Result Date: 06/30/2017 CLINICAL DATA:  Endotracheal tube EXAM: PORTABLE CHEST 1 VIEW COMPARISON:  06/30/2017 FINDINGS: Endotracheal tube 2 cm above the carina. Right jugular catheter tip in the SVC. NG tube in the stomach. Mild improvement bibasilar atelectasis. Continued hypoventilation. Negative for heart failure or effusion. No pneumothorax IMPRESSION: Mild improvement hypoventilation and bibasilar atelectasis. Support lines remain in satisfactory position. Electronically Signed   By: Franchot Gallo M.D.   On: 06/30/2017 09:17   Dg Chest Port  1 View  Result Date: 06/30/2017 CLINICAL DATA:  Status post advancement of ET tube today. EXAM: PORTABLE CHEST 1 VIEW COMPARISON:  Single-view of the chest earlier today. FINDINGS: Endotracheal tube has been advanced. The tip is just below the thoracic inlet and the tube should be advanced 1-2 cm for better positioning. Defibrillator pad has been removed. Right IJ catheter remains in place. Subsegmental atelectasis left mid lung noted. There is cardiomegaly. IMPRESSION: ETT tip is just below the thoracic inlet and should be advanced 1-2 cm. No other change. Electronically Signed   By: Inge Rise M.D.   On: 06/30/2017 08:16   Dg Chest Port 1 View  Result Date: 06/30/2017 CLINICAL DATA:  Status post central line placement EXAM: PORTABLE CHEST 1 VIEW COMPARISON:  Chest x-ray and chest CT scan of earlier today FINDINGS: The lungs remain hypoinflated. The right internal jugular venous catheter tip projects over the junction of the proximal and middle thirds of the SVC. No postprocedure pneumothorax is observed. The cardiac silhouette is enlarged. The pulmonary vascularity is not clearly engorged. S sternal pacemaker defibrillator pads are present. The esophagogastric tube tip in proximal port project in the proximal gastric body. The endotracheal tube tip appears to be somewhat high in position approximately 2 cm above the superior margin of the clavicular heads at approximately 8.1 cm above the carina. Known right anterior rib fractures are not demonstrated on today's study. IMPRESSION: No postprocedure complication following right internal jugular venous catheter placement. High positioning of the endotracheal tube.  Advancement by at least 5 cm is recommended. Good positioning of the esophagogastric tube. Bilateral hypoinflation. Cardiomegaly without pulmonary vascular congestion. Electronically Signed   By: David  Martinique M.D.   On: 06/30/2017 06:56   Dg Chest Portable 1 View  Addendum Date: 06/30/2017    ADDENDUM REPORT: 06/30/2017 05:12 ADDENDUM: These results were called by telephone at the time of interpretation on 06/30/2017 at 2:55 am to Dr. Charlesetta Ivory , who verbally acknowledged these results. Electronically Signed   By: Fidela Salisbury M.D.   On: 06/30/2017 05:12   Result Date: 06/30/2017 CLINICAL DATA:  Intubation. EXAM: PORTABLE CHEST 1 VIEW COMPARISON:  10/28/2016 FINDINGS: Endotracheal tube at the level of T1 vertebral body. Tortuosity of the trachea at the cervicothoracic junction. Enteric catheter descends the abdomen. Shock pads overlie the chest. There is enlargement of the mediastinum which measures 12 cm. No evidence of pneumothorax. Low lung volumes. No focal airspace consolidation. Osseous structures are without acute abnormality. Soft tissues are grossly normal. IMPRESSION: Apparent widening of the mediastinum. This may be due to portable technique and low lung volumes, however acute aortic pathology cannot be excluded. If clinically warranted, CT angiogram of the chest should be considered. Endotracheal tube at the cervicothoracic junction. Advancement by 5 cm is recommended. Bowling of the tracheal air column at the cervicothoracic junction with uncertain significance. Electronically Signed: By: Fidela Salisbury M.D. On: 06/30/2017 02:50   Ct Angio Chest/abd/pel For Dissection W And/or Wo Contrast  Result Date: 06/30/2017 CLINICAL DATA:  Initial evaluation for acute cardiac arrest. EXAM: CT ANGIOGRAPHY CHEST, ABDOMEN AND PELVIS TECHNIQUE: Multidetector CT imaging through the chest, abdomen and pelvis was performed using the standard protocol during bolus administration of intravenous contrast. Multiplanar reconstructed images and MIPs were obtained and reviewed to evaluate the vascular anatomy. CONTRAST:  158m ISOVUE-370 IOPAMIDOL (ISOVUE-370) INJECTION 76% COMPARISON:  Prior radiograph from earlier same day. FINDINGS: CTA CHEST FINDINGS Cardiovascular: Precontrast imaging  through the intrathoracic aorta at demonstrates no acute abnormality. Mild scattered atheromatous plaque within the aortic arch and descending intrathoracic aorta. Postcontrast imaging demonstrates no evidence for dissection or other acute abnormality. Partially visualized great vessels within normal limits. Cardiomegaly. No pericardial effusion. Limited evaluation of the pulmonary arterial tree grossly unremarkable. Mediastinum/Nodes: Thyroid normal. No enlarged mediastinal, hilar, or axillary lymph nodes. No mediastinal hematoma. Enteric tube in place within the esophagus. Lungs/Pleura: Endotracheal tube in place. Layering secretions within the subglottic trachea extending into the right mainstem bronchus. Deep tendon atelectasis present within the lower lobes bilaterally. Lungs are otherwise largely clear. No other focal infiltrates. No pulmonary edema or pleural effusion. No pneumothorax. No worrisome pulmonary nodule or mass. Musculoskeletal: External soft tissues demonstrate no acute abnormality. Acute nondisplaced fractures of the left anterior third through sixth ribs, which may be related to chest compressions. No other acute osseus abnormality. No worrisome lytic or blastic osseous lesions. Review of the MIP images confirms the above findings. CTA ABDOMEN AND PELVIS FINDINGS VASCULAR Aorta: Intra-abdominal Eric or neural well opacified without evidence for dissection or other acute abnormality. No aneurysm. Moderate atherosclerosis. Celiac: Stenosis of approximately 40% present at the origin of the celiac. Celiac axis and its branch vessels well opacified distally. SMA: Atheromatous plaque at the proximal SMA without flow-limiting stenosis. SMA patent to its distal aspects. Renals: Single renal arteries present bilaterally. Atheromatous plaque about the origin of the renal artery is without high-grade stenosis. IMA: IMA patent to its distal aspect. Inflow: Iliac artery is widely patent bilaterally. Veins:  No acute  venous abnormality. Review of the MIP images confirms the above findings. NON-VASCULAR Hepatobiliary: The liver demonstrates no acute abnormality. Gallbladder within normal limits. No biliary dilatation. Pancreas: Pancreas within normal limits. Spleen: Spleen within normal limits. Adrenals/Urinary Tract: 2.7 cm heterogeneous right adrenal nodule, indeterminate. Adrenal glands otherwise unremarkable. Kidneys equal in size with symmetric enhancement. Few scattered renal cysts noted bilaterally, right greater than left. No nephrolithiasis or hydronephrosis. No focal enhancing renal mass. No hydroureter. Bladder largely decompressed with a Foley catheter in place. Stomach/Bowel: Enteric tube terminates within the stomach. No evidence for bowel obstruction. Normal appendix. No acute inflammatory changes seen about the bowels. Lymphatic: No adenopathy. Reproductive: Prostate normal. Other: No free air or fluid. Small fat containing paraumbilical hernia noted. Musculoskeletal: No acute osseus abnormality. No worrisome lytic or blastic osseous lesions. Mild scoliosis noted. Review of the MIP images confirms the above findings. IMPRESSION: 1. No CT evidence for dissection or other acute aortic pathology. 2. Acute nondisplaced fractures of the left anterior third through sixth ribs, which may be related to chest compressions given history of recent CPR. 3. Endotracheal and enteric tubes in place. Layering secretions within the subglottic trachea and right mainstem bronchus. Associated bibasilar atelectasis. 4. No other acute abnormality within the chest, abdomen, and pelvis. 5. 2.7 cm heterogeneous right adrenal nodule, indeterminate. Follow-up examination with nonemergent adrenal mass protocol CT and/or MRI suggested. Electronically Signed   By: Jeannine Boga M.D.   On: 06/30/2017 05:26     Assessment and Recommendation  61 y.o. male with acute non-ST elevation myocardial infarction with acute cardiac  arrest status post CPR intubation and medication management treatment currently hemodynamically stable status post cardiac catheterization showing mild LV systolic dysfunction and significant two-vessel coronary artery disease but left anterior descending artery without critical stenosis not requiring further stenting or intervention at this time. Diagonal with furtehr infarction and ischemia on the 5th but now more stable and improved with continued appropriate care. Round table discussion about above with continued medical mgt effort rather than intervention of diagonal artery or other arteries. And apppeares to be working better now 1. Continue heparin for until extubated and then consider changing to aspirin and Plavix when deemed appropriate as per intensive care 2.  additional beta blocker or ACE inhibitor for myocardial infarction when able after extubation for MI and ischemia reducttion 3. No restrictions to extubation at this time 4. Continue pulmonary supportive care 5. No further cardiac diagnostics necessary at this time 6. Call if weekend questions or significant cardiac changes other wsie will reassess monday  Signed, Serafina Royals M.D. FACC

## 2017-07-03 NOTE — Progress Notes (Addendum)
PULMONARY / CRITICAL CARE MEDICINE   Name: Jason Gross MRN: [id number] DOB: [birth year]    ADMISSION DATE:  06/30/2017  PT PROFILE:   30 M with minimal PMH admitted after out of hospital cardiac arrest (shockable rhythm - presumed VF). 10-15 mins of pulselessness. Wife administered chest compressions awaiting EMS. TTM protocol (36 degrees) initiated.   MAJOR EVENTS/TEST RESULTS: 12/04 Admission as above 12/04 CT head/neck: no acute findings 12/04 CTA chest/abd/pelvis: No CT evidence for dissection or other acute aortic pathology. Acute nondisplaced fractures of the left anterior third through sixth ribs, which may be related to chest compressions given history of recent CPR. Layering secretions within the subglottic trachea and right mainstem bronchus. Associated bibasilar atelectasis. No other acute abnormality within the chest, abdomen, and pelvis 12/04 LHC: Three-vessel coronary artery disease, including moderate diffuse mid and distal LAD disease, 95% D1 stenosis, and chronic total occlusions of the proximal/mid LCx and proximal/mid RCA. Suspect culprit vessel may have been D1, as other occlusions are well-collateralized and likely chronic. Med rx recommended 12/05 Echocardiogram: LVEF 65-70%. No regional wall motion abnormalities. No significant valvular abnormalities  12/05 Rewarming. Off vasopressors.  12/05 PM: Again developed severe ventilator dyssynchrony despite heavy level of sedation. NMB resumed. EKG with ischemic changes across the precordium. Metoprolol and NTG paste initiated 12/06 following commands.  Troponin I elevated. Tolerated SBT X 45 mins but developed agitation with hypertension, ischemic changes on EKG. NTG gtt initiated. Sedation resumed. Then NTG gtt stopped due to decreasing BP. Increasing respiratory secretions (mucopurulent)  INDWELLING DEVICES:: ETT 12/04 >>  R IJ CVL 12/04 >>  R radial A-line 12/04 >>   MICRO DATA: MRSA PCR 12/04 >> NEG Urine 12/04 >>  NEG Resp 12/04 >> NOF Blood 12/04 >>  resp 12/05 >>   ANTIMICROBIALS:  Pip-tazo 12/04 >>   SUBJECTIVE:  Able to follow commands.  Patient is awake, alert and communicating, has just been started on Precedex, fentanyl is being weaned off, starting a spontaneous breathing trial.  VITAL SIGNS: BP 131/71   Pulse 71   Temp 100.2 F (37.9 C)   Resp 18   Ht 5\' 11"  (1.803 m)   Wt 102.9 kg (226 lb 13.7 oz)   SpO2 97%   BMI 31.64 kg/m   VENTILATOR SETTINGS: Vent Mode: PRVC FiO2 (%):  [30 %] 30 % Set Rate:  [18 bmp] 18 bmp Vt Set:  [500 mL] 500 mL PEEP:  [5 cmH20] 5 cmH20 Pressure Support:  [5 cmH20-15 cmH20] 10 cmH20  INTAKE / OUTPUT: I/O last 3 completed shifts: In: 3800.2 [I.V.:2026.2; Other:120; NG/GT:1404; IV Piggyback:250] Out: 0626 [Urine:3245; Emesis/NG output:1690]  PHYSICAL EXAMINATION: General: Intubated, sedated, RASS -3 to +2. + F/C Neuro: CNs intact, moves all extremities HEENT: NCAT, sclerae white Cardiovascular: Regular, no M noted Lungs: Bilateral rhonchi Abdomen: Soft, diminished bowel sounds Extremities: No edema  LABS:  BMET Recent Labs  Lab 07/01/17 1434 07/02/17 0401 07/03/17 0430  NA 136 134* 136  K 3.6 4.1 4.2  CL 103 104 100*  CO2 23 21* 29  BUN 16 18 29*  CREATININE 0.78 0.62 0.74  GLUCOSE 114* 116* 113*    Electrolytes Recent Labs  Lab 06/30/17 0600  07/01/17 1434 07/02/17 0401 07/03/17 0430  CALCIUM 7.3*   < > 7.3* 7.2* 7.5*  MG 1.6*  --   --   --   --   PHOS 3.3  --   --   --   --    < > =  values in this interval not displayed.    CBC Recent Labs  Lab 07/01/17 0341 07/02/17 0401 07/03/17 0136  WBC 10.0 8.4 9.9  HGB 12.7* 11.6* 11.5*  HCT 36.2* 33.5* 33.2*  PLT 152 145* 181    Coag's Recent Labs  Lab 06/30/17 0600  APTT 27  INR 1.13    Sepsis Markers Recent Labs  Lab 06/30/17 0200 06/30/17 0600 07/01/17 0341 07/02/17 0401  LATICACIDVEN 5.0* 2.0*  --   --   PROCALCITON  --  0.60 1.34 1.02     ABG Recent Labs  Lab 06/30/17 0200 06/30/17 0610  PHART 7.28* 7.25*  PCO2ART 43 54*  PO2ART 92 233*    Liver Enzymes Recent Labs  Lab 06/30/17 0126 06/30/17 0600  AST 74* 69*  ALT 68* 66*  ALKPHOS 51 36*  BILITOT 1.0 0.7  ALBUMIN 3.7 3.2*    Cardiac Enzymes Recent Labs  Lab 07/02/17 0401 07/02/17 1539 07/03/17 0430  TROPONINI 7.40* 3.98* 4.32*    Glucose Recent Labs  Lab 07/02/17 1146 07/02/17 1548 07/02/17 1940 07/02/17 2343 07/03/17 0348 07/03/17 0742  GLUCAP 118* 112* 118* 108* 110* 104*    CXR: Low volumes with bilateral basilar atelectasis    ASSESSMENT / PLAN:  PULMONARY A: Ventilator dependent respiratory failure after cardiac arrest Suspected aspiration pneumonia Possibly mild pulmonary edema P:   Try spontaneous awakening and breathing trial this morning on Precedex  CARDIOVASCULAR A:  Cardiac arrest due to acute coronary syndrome NSTEMI Three-vessel coronary disease Hypertension P:  Continue ASA, heparin Metoprolol initiated 12/05.  Dose increased 12/06 Amlodipine initiated 12/06 Continue hydralazine as needed to maintain SBP <160 mmHg Metoprolol IV as needed to maintain HR <100/min Cardiology following -discussed with Dr. [name].  He reviewed cath films.  No intervention planned.   INFECTIOUS A:   Aspiration pneumonia P:   Monitor temp, WBC count Micro and abx as above   ENDOCRINE A:   Mild stress-induced hyperglycemia, resolved P:   Continue sensitive scale SSI  NEUROLOGIC A:   Anoxic encephalopathy after cardiac arrest -improving ICU/ventilator associated discomfort P:   RASS goal: -1, -2 Cont PAD protocol - fentanyl infusion with midozalam as needed NMB protocol discontinued 12/06  Critical care time 35 minutes  Domani Bakos, DO    07/03/2017, 8:02 AM

## 2017-07-03 NOTE — Progress Notes (Addendum)
Pt with frequent episodes of "gagging" on his ETT.  At [place] Pt gagged on tube and vomited a large amount.  Tube feedings discontinued.   Administered versed 2mg  for comfort.  Pt sleeping at this time.  Notified [place], NP.  Hold tube feeding for now.

## 2017-07-03 NOTE — Progress Notes (Signed)
ANTICOAGULATION CONSULT NOTE - FOLLOW UP  Consult  Pharmacy Consult for heparin Indication: chest pain/ACS s/p cardiac arrest  No Known Allergies  Patient Measurements: Height: '5\' 11"'$  (180.3 cm) Weight: 224 lb 13.9 oz (102 kg) IBW/kg (Calculated) : 75.3 Heparin Dosing Weight: 97 kg  Vital Signs: Temp: 100.4 F (38 C) (12/07 0000) Temp Source: Rectal (12/07 0100) BP: 125/69 (12/07 0200) Pulse Rate: 74 (12/07 0200)  Labs: Recent Labs    06/30/17 0600  07/01/17 0341  07/01/17 1434 07/02/17 0401 07/02/17 1129 07/02/17 1539 07/02/17 1807 07/03/17 0136  HGB 13.7  --  12.7*  --   --  11.6*  --   --   --  11.5*  HCT 39.5*  --  36.2*  --   --  33.5*  --   --   --  33.2*  PLT 241  --  152  --   --  145*  --   --   --  181  APTT 27  --   --   --   --   --   --   --   --   --   LABPROT 14.4  --   --   --   --   --   --   --   --   --   INR 1.13  --   --   --   --   --   --   --   --   --   HEPARINUNFRC  --    < >  --    < >  --  0.17* 0.42  --  0.29* 0.44  CREATININE 1.04  0.99   < > 0.78  --  0.78 0.62  --   --   --   --   TROPONINI 4.92*   < >  --   --  0.88* 7.40*  --  3.98*  --   --    < > = values in this interval not displayed.    Estimated Creatinine Clearance: 119.4 mL/min (by C-G formula based on SCr of 0.62 mg/dL).   Medical History: Past Medical History:  Diagnosis Date  . Allergy   . Benign mass of parotid gland   . GERD (gastroesophageal reflux disease)   . Hyperlipidemia    not on meds  . Hypertension   . Vertigo     Medications:  Scheduled:  . amLODipine  10 mg Per Tube Daily  . aspirin  81 mg Per Tube Daily  . atorvastatin  40 mg Per Tube q1800  . chlorhexidine gluconate (MEDLINE KIT)  15 mL Mouth Rinse BID  . famotidine  20 mg Per Tube Daily  . feeding supplement (PRO-STAT SUGAR FREE 64)  60 mL Per Tube QID  . feeding supplement (VITAL HIGH PROTEIN)  1,000 mL Per Tube Q24H  . free water  100 mL Per Tube Q8H  . Influenza vac split quadrivalent  PF  0.5 mL Intramuscular Tomorrow-1000  . insulin aspart  0-9 Units Subcutaneous Q4H  . ipratropium-albuterol  3 mL Nebulization Q6H  . mouth rinse  15 mL Mouth Rinse QID  . metoprolol tartrate  50 mg Per Tube BID  . nitroGLYCERIN  1 inch Topical Q8H  . sodium chloride flush  10-40 mL Intracatheter Q12H    Assessment: Patient admitted for cardiac arrest trops up to 4.92. Patient is on TTM and is now s/p cath who will need CABG or PCI.  Goal of  Therapy:  Heparin level 0.3-0.7 units/ml Monitor platelets by anticoagulation protocol: Yes   Plan:  Will resume heparin drip at 1100 units/hr with no bolus  2 hours after TR band deflation. Will check a HL 6 hours after resuming infusion.   12/5 @ 2212 HL 0.29 subtherapeutic. Will rebolus w/ heparin 1500 units IV x 1 and increase rate to 1250 units/hr and will recheck HL @ 0700 and CBC w/ am labs.  12/5 @ 0630 HL 0.53 therapeutic. Will continue current rate and will recheck @ 1230.  12/5 @ 1320. HL= 0.47. Will continue heparin infusion at current rate and f/u AM labs.   12/6 @ 0500 HL 0.17 subtherapeutic, per RN drip was never stopped. Will rebolus w/ heparin 3000 units IV x 1 and increase rate to 1450 units/hr and recheck HL @ 1100  12/6 @ 1130 HL 0.42. Continue heparin infusion at current rate and recheck a HL in 6 hours.   12/6: Heparin level resulted @ 0.29. Will give bolus of 1500 units x 1 and will increase heparin gtt rate to 1650 units/hr. Will recheck Heparin level @ 0200.   12/7 @ 0130 HL 0.44 therapeutic. Will continue current rate and will recheck HL @ 0800. CBC stable.  Tobie Lords, PharmD, BCPS Clinical Pharmacist 07/03/2017

## 2017-07-03 NOTE — Plan of Care (Signed)
Pt struggling with "gagging" on the ETT overnight.  He vomited as a result of excessive gagging.

## 2017-07-03 NOTE — Progress Notes (Signed)
ANTICOAGULATION CONSULT NOTE - FOLLOW UP  Consult  Pharmacy Consult for heparin Indication: chest pain/ACS s/p cardiac arrest  No Known Allergies  Patient Measurements: Height: '5\' 11"'  (180.3 cm) Weight: 226 lb 13.7 oz (102.9 kg) IBW/kg (Calculated) : 75.3 Heparin Dosing Weight: 97 kg  Vital Signs: Temp: 99.7 F (37.6 C) (12/07 1200) Temp Source: Rectal (12/07 1200) BP: 138/80 (12/07 1200) Pulse Rate: 60 (12/07 1200)  Labs: Recent Labs    07/01/17 0341  07/01/17 1434 07/02/17 0401  07/02/17 1539  07/03/17 0136 07/03/17 0430 07/03/17 0800 07/03/17 1100  HGB 12.7*  --   --  11.6*  --   --   --  11.5*  --   --   --   HCT 36.2*  --   --  33.5*  --   --   --  33.2*  --   --   --   PLT 152  --   --  145*  --   --   --  181  --   --   --   HEPARINUNFRC  --    < >  --  0.17*   < >  --    < > 0.44  --  0.10* 0.49  CREATININE 0.78  --  0.78 0.62  --   --   --   --  0.74  --   --   TROPONINI  --   --  0.88* 7.40*  --  3.98*  --   --  4.32*  --   --    < > = values in this interval not displayed.    Estimated Creatinine Clearance: 119.9 mL/min (by C-G formula based on SCr of 0.74 mg/dL).   Medical History: Past Medical History:  Diagnosis Date  . Allergy   . Benign mass of parotid gland   . GERD (gastroesophageal reflux disease)   . Hyperlipidemia    not on meds  . Hypertension   . Vertigo     Medications:  Scheduled:  . amLODipine  10 mg Per Tube Daily  . aspirin  81 mg Per Tube Daily  . atorvastatin  40 mg Per Tube q1800  . chlorhexidine gluconate (MEDLINE KIT)  15 mL Mouth Rinse BID  . famotidine  20 mg Per Tube Daily  . feeding supplement (PRO-STAT SUGAR FREE 64)  60 mL Per Tube QID  . feeding supplement (VITAL HIGH PROTEIN)  1,000 mL Per Tube Q24H  . free water  100 mL Per Tube Q8H  . insulin aspart  0-9 Units Subcutaneous Q4H  . ipratropium-albuterol  3 mL Nebulization Q6H  . mouth rinse  15 mL Mouth Rinse QID  . metoprolol tartrate  50 mg Per Tube BID  .  nitroGLYCERIN  1 inch Topical Q8H  . pantoprazole (PROTONIX) IV  40 mg Intravenous Q12H  . sodium chloride flush  10-40 mL Intracatheter Q12H    Assessment: Patient admitted for cardiac arrest trops up to 4.92. Patient is on TTM and is now s/p cath who will need CABG or PCI.  Goal of Therapy:  Heparin level 0.3-0.7 units/ml Monitor platelets by anticoagulation protocol: Yes   Plan:  Heparin level this AM falsely low but WNL upon redraw. Will continue heparin infusion at current rate and f/u AM labs.   Ulice Dash, PharmD Clinical Pharmacist  07/03/2017

## 2017-07-03 NOTE — Progress Notes (Signed)
[facility] NAME: Jason Gross    MR#:  [id number]  DATE OF BIRTH:  24-Mar-1956  SUBJECTIVE:   Patient on weaning trial this am Currently on Precedex and fentanyl is being weaned off  REVIEW OF SYSTEMS:    Awake cannot provide ROS   Tolerating Diet: npo      DRUG ALLERGIES:  No Known Allergies  VITALS:  Blood pressure 114/71, pulse (!) 58, temperature 99.9 F (37.7 C), resp. rate 18, height 5\' 11"  (1.803 m), weight 102.9 kg (226 lb 13.7 oz), SpO2 98 %.  PHYSICAL EXAMINATION:  Constitutional: Appears well-developed and well-nourished. No distress.HENT: Normocephalic. . Intubated Eyes: Conjunctivae are normal.  no scleral icterus.  Neck: Normal ROM. Neck supple. No JVD. No tracheal deviation. CVS: RRR, S1/S2 +, no murmurs, no gallops, no carotid bruit.  Pulmonary: Effort and breath sounds normal, no stridor, rhonchi, wheezes, rales.  Abdominal: Soft. BS +,  no distension, tenderness, rebound or guarding.  Musculoskeletal: Normal range of motion. No edema and no tenderness.  Neuro: Awake on VENT Skin: Skin is warm and dry. No rash noted. Psychiatric: Awake    LABORATORY PANEL:   CBC Recent Labs  Lab 07/03/17 0136  WBC 9.9  HGB 11.5*  HCT 33.2*  PLT 181   ------------------------------------------------------------------------------------------------------------------  Chemistries  Recent Labs  Lab 06/30/17 0600  07/03/17 0430  NA 136   < > 136  K 4.3   < > 4.2  CL 108   < > 100*  CO2 23   < > 29  GLUCOSE 171*   < > 113*  BUN 14   < > 29*  CREATININE 1.04  0.99   < > 0.74  CALCIUM 7.3*   < > 7.5*  MG 1.6*  --   --   AST 69*  --   --   ALT 66*  --   --   ALKPHOS 36*  --   --   BILITOT 0.7  --   --    < > = values in this interval not displayed.   ------------------------------------------------------------------------------------------------------------------  Cardiac Enzymes Recent Labs  Lab  07/02/17 0401 07/02/17 1539 07/03/17 0430  TROPONINI 7.40* 3.98* 4.32*   ------------------------------------------------------------------------------------------------------------------  RADIOLOGY:  Dg Chest Port 1 View  Result Date: 07/03/2017 CLINICAL DATA:  Respiratory distress EXAM: PORTABLE CHEST 1 VIEW COMPARISON:  07/02/2017 FINDINGS: Endotracheal tube, nasogastric catheter and right jugular central line are again seen and stable. Cardiac shadow is stable. The overall inspiratory effort is again poor without focal infiltrate or sizable effusion. No bony abnormality is noted. IMPRESSION: Poor inspiratory effort.  No acute abnormality noted. Electronically Signed   By: Inez Catalina M.D.   On: 07/03/2017 08:12   Dg Chest Port 1 View  Result Date: 07/02/2017 CLINICAL DATA:  Respiratory failure EXAM: PORTABLE CHEST 1 VIEW COMPARISON:  07/01/2017 FINDINGS: Support devices are unchanged. Low lung volumes with vascular congestion and bibasilar atelectasis. No real change since prior study. IMPRESSION: Low lung volumes, vascular congestion and bibasilar atelectasis. Electronically Signed   By: Rolm Baptise M.D.   On: 07/02/2017 08:44     ASSESSMENT AND PLAN:   61 year old male with history of essential hypertension who presents after cardiac arrest.  1. Cardiac arrest due to non-ST elevation MI status post cardiac catheterization Cardiac catheter shows mild LV systolic distant with occluded circumflex and right coronary artery which appeared to be old and have collateral blood flow from left anterior  descending artery. Diagonal artery with some atherosclerosis and possible culprit lesion but small artery not amenable and not appropriate for PCI and stent placement. Left anterior descending artery with modest LAD stenosis and not requiring further intervention  Appreciate cardiology consultation Transition from heparin drip to  aspirin and Plavix.  Elevated troponin noted on December  5. Plan is medical management. Continue aspirin, Plavix, atorvastatin, metoprolol, nitroglycerin  2. Acute hypoxic respiratory failure due to cardiac arrest/aspiration pneumonia: Ventilator management as per ICU team. Plan to hopefully extubate today Continue Zosyn for aspiration pneumonia  3. Hyperglycemia:  A1c 5.6  Continue sliding scale  4. Acute metabolic encephalopathy due to cardiac arrest: Patient awakening slowly with wake up trial this am   5. Essential hypertension: Blood pressure is improved Continue Norvasc and metoprolol  PPI added to prevent stress-induced gastritis/ulcers  Management plans discussed with Dr Jefferson Fuel   CODE STATUS: [place] PATIENT:21 minutes.     POSSIBLE D/C ??, DEPENDING ON CLINICAL CONDITION.   Hicks Feick M.D on 07/03/2017 at 10:32 AM  Between 7am to 6pm - Pager - [phone] After 6pm go to [email or web address] - password [email or web address] [facility]  Office  [phone]  CC: Primary care physician; Venia Carbon, MD  Note: This dictation was prepared with Dragon dictation along with smaller phrase technology. Any transcriptional errors that result from this process are unintentional.

## 2017-07-04 ENCOUNTER — Inpatient Hospital Stay: Payer: No Typology Code available for payment source

## 2017-07-04 LAB — TROPONIN I
Troponin I: 3.14 ng/mL (ref <0.03)
Troponin I: 2.89 ng/mL (ref <0.03)
Troponin I: 2.13 ng/mL (ref <0.03)

## 2017-07-04 LAB — CBC
HEMATOCRIT: 34.7 % — AB (ref 40.0–52.0)
HEMOGLOBIN: 12.3 g/dL — AB (ref 13.0–18.0)
MCH: 30 pg (ref 26.0–34.0)
MCHC: 35.4 g/dL (ref 32.0–36.0)
MCV: 84.9 fL (ref 80.0–100.0)
Platelets: 207 10*3/uL (ref 150–440)
RBC: 4.09 MIL/uL — ABNORMAL LOW (ref 4.40–5.90)
RDW: 13.5 % (ref 11.5–14.5)
WBC: 10.9 10*3/uL — AB (ref 3.8–10.6)

## 2017-07-04 LAB — GLUCOSE, CAPILLARY
Glucose-Capillary: 93 mg/dL (ref 65–99)
Glucose-Capillary: 97 mg/dL (ref 65–99)
Glucose-Capillary: 97 mg/dL (ref 65–99)
Glucose-Capillary: 87 mg/dL (ref 65–99)
Glucose-Capillary: 125 mg/dL — ABNORMAL HIGH (ref 65–99)

## 2017-07-04 LAB — BASIC METABOLIC PANEL
ANION GAP: 11 (ref 5–15)
BUN: 26 mg/dL — ABNORMAL HIGH (ref 6–20)
CALCIUM: 8.1 mg/dL — AB (ref 8.9–10.3)
CO2: 27 mmol/L (ref 22–32)
CREATININE: 0.68 mg/dL (ref 0.61–1.24)
Chloride: 103 mmol/L (ref 101–111)
Glucose, Bld: 99 mg/dL (ref 65–99)
Potassium: 3.9 mmol/L (ref 3.5–5.1)
SODIUM: 141 mmol/L (ref 135–145)

## 2017-07-04 LAB — MAGNESIUM: Magnesium: 2 mg/dL (ref 1.7–2.4)

## 2017-07-04 LAB — PROCALCITONIN: Procalcitonin: 0.35 ng/mL

## 2017-07-04 LAB — PHOSPHORUS: Phosphorus: 1.6 mg/dL — ABNORMAL LOW (ref 2.5–4.6)

## 2017-07-04 LAB — HEPARIN LEVEL (UNFRACTIONATED): HEPARIN UNFRACTIONATED: 0.36 [IU]/mL (ref 0.30–0.70)

## 2017-07-04 MED ORDER — ACETAMINOPHEN 325 MG PO TABS
650.0000 mg | ORAL_TABLET | Freq: Four times a day (QID) | ORAL | Status: DC | PRN
  Administered 2017-07-04: 650 mg via ORAL

## 2017-07-04 MED ORDER — IPRATROPIUM-ALBUTEROL 0.5-2.5 (3) MG/3ML IN SOLN: 3.0000 mL | Freq: Four times a day (QID) | RESPIRATORY_TRACT | Status: DC | PRN

## 2017-07-04 MED ORDER — FENTANYL CITRATE (PF) 100 MCG/2ML IJ SOLN
25.0000 ug | Freq: Once | INTRAMUSCULAR | Status: AC
  Administered 2017-07-04: 25 ug via INTRAVENOUS
  Filled 2017-07-04: qty 2

## 2017-07-04 MED ORDER — ACETAMINOPHEN 650 MG RE SUPP: 650.0000 mg | Freq: Four times a day (QID) | RECTAL | Status: DC | PRN

## 2017-07-04 MED ORDER — ALUM & MAG HYDROXIDE-SIMETH 200-200-20 MG/5ML PO SUSP
30.0000 mL | ORAL | Status: DC | PRN
  Filled 2017-07-04: qty 30

## 2017-07-04 MED ORDER — PROMETHAZINE HCL 25 MG/ML IJ SOLN
12.5000 mg | Freq: Four times a day (QID) | INTRAMUSCULAR | Status: DC | PRN
  Administered 2017-07-04 – 2017-07-06 (×5): 12.5 mg via INTRAVENOUS
  Filled 2017-07-04 (×5): qty 1

## 2017-07-04 NOTE — Progress Notes (Signed)
ANTICOAGULATION CONSULT NOTE - FOLLOW UP  Consult  Pharmacy Consult for heparin Indication: chest pain/ACS s/p cardiac arrest  No Known Allergies  Patient Measurements: Height: '5\' 11"'$  (180.3 cm) Weight: 226 lb 13.7 oz (102.9 kg) IBW/kg (Calculated) : 75.3 Heparin Dosing Weight: 97 kg  Vital Signs: Temp: 101.1 F (38.4 C) (12/08 0400) Temp Source: Rectal (12/07 2000) BP: 149/81 (12/08 0400) Pulse Rate: 79 (12/08 0400)  Labs: Recent Labs    07/02/17 0401  07/02/17 1539  07/03/17 0136 07/03/17 0430 07/03/17 0800 07/03/17 1100 07/04/17 0502  HGB 11.6*  --   --   --  11.5*  --   --   --  12.3*  HCT 33.5*  --   --   --  33.2*  --   --   --  34.7*  PLT 145*  --   --   --  181  --   --   --  207  HEPARINUNFRC 0.17*   < >  --    < > 0.44  --  0.10* 0.49 0.36  CREATININE 0.62  --   --   --   --  0.74  --   --  0.68  TROPONINI 7.40*  --  3.98*  --   --  4.32*  --   --   --    < > = values in this interval not displayed.    Estimated Creatinine Clearance: 119.9 mL/min (by C-G formula based on SCr of 0.68 mg/dL).   Medical History: Past Medical History:  Diagnosis Date  . Allergy   . Benign mass of parotid gland   . GERD (gastroesophageal reflux disease)   . Hyperlipidemia    not on meds  . Hypertension   . Vertigo     Medications:  Scheduled:  . amLODipine  10 mg Oral Daily  . aspirin EC  81 mg Oral Daily  . atorvastatin  40 mg Oral q1800  . chlorhexidine gluconate (MEDLINE KIT)  15 mL Mouth Rinse BID  . famotidine  20 mg Oral Daily  . fentaNYL  12.5 mcg Transdermal Q72H  . insulin aspart  0-9 Units Subcutaneous Q4H  . ipratropium-albuterol  3 mL Nebulization Q6H  . mouth rinse  15 mL Mouth Rinse QID  . metoprolol tartrate  50 mg Oral BID  . nitroGLYCERIN  1 inch Topical Q8H  . pantoprazole (PROTONIX) IV  40 mg Intravenous Q12H  . sodium chloride flush  10-40 mL Intracatheter Q12H    Assessment: Patient admitted for cardiac arrest trops up to 4.92. Patient  is on TTM and is now s/p cath who will need CABG or PCI.  Goal of Therapy:  Heparin level 0.3-0.7 units/ml Monitor platelets by anticoagulation protocol: Yes   Plan:  Heparin level this AM falsely low but WNL upon redraw. Will continue heparin infusion at current rate and f/u AM labs.   12/8 @ 0500 HL 0.36 therapeutic. Will continue current rate and will recheck w/ am labs.  Tobie Lords, PharmD, BCPS Clinical Pharmacist 07/04/2017

## 2017-07-04 NOTE — Progress Notes (Signed)
[facility] [place] NAME: Kasson Lamere    MR#:  [id number]  DATE OF BIRTH:  [birth year]  SUBJECTIVE:  Pt extuabted. Out in the chair. Rib pain  REVIEW OF SYSTEMS:   Review of Systems  Constitutional: Negative for chills, fever and weight loss.  HENT: Negative for ear discharge, ear pain and nosebleeds.   Eyes: Negative for blurred vision, pain and discharge.  Respiratory: Negative for sputum production, shortness of breath, wheezing and stridor.   Cardiovascular: Negative for chest pain, palpitations, orthopnea and PND.  Gastrointestinal: Negative for abdominal pain, diarrhea, nausea and vomiting.  Genitourinary: Negative for frequency and urgency.  Musculoskeletal: Positive for back pain. Negative for joint pain.  Neurological: Positive for weakness. Negative for sensory change, speech change and focal weakness.  Psychiatric/Behavioral: Negative for depression and hallucinations. The patient is not nervous/anxious.    Tolerating Diet:yesTolerating PT: pending  DRUG ALLERGIES:  No Known Allergies  VITALS:  Blood pressure 118/68, pulse 69, temperature 100.2 F (37.9 C), resp. rate 19, height 5\' 11"  (1.803 m), weight 101.1 kg (222 lb 14.2 oz), SpO2 93 %.  PHYSICAL EXAMINATION:   Physical Exam  GENERAL:  61 y.o.-year-old patient lying in the bed with no acute distress.  EYES: Pupils equal, round, reactive to light and accommodation. No scleral icterus. Extraocular muscles intact.  HEENT: Head atraumatic, normocephalic. Oropharynx and nasopharynx clear.  NECK:  Supple, no jugular venous distention. No thyroid enlargement, no tenderness.  LUNGS: Normal breath sounds bilaterally, no wheezing, rales, rhonchi. No use of accessory muscles of respiration.  CARDIOVASCULAR: S1, S2 normal. No murmurs, rubs, or gallops.  ABDOMEN: Soft, nontender, nondistended. Bowel sounds present. No organomegaly or mass.  EXTREMITIES: No cyanosis,  clubbing or edema b/l.    NEUROLOGIC: Cranial nerves II through XII are intact. No focal Motor or sensory deficits b/l.   PSYCHIATRIC:  patient is alert and oriented x 3.  SKIN: No obvious rash, lesion, or ulcer.   LABORATORY PANEL:  CBC Recent Labs  Lab 07/04/17 0502  WBC 10.9*  HGB 12.3*  HCT 34.7*  PLT 207    Chemistries  Recent Labs  Lab 06/30/17 0600  07/04/17 0502  NA 136   < > 141  K 4.3   < > 3.9  CL 108   < > 103  CO2 23   < > 27  GLUCOSE 171*   < > 99  BUN 14   < > 26*  CREATININE 1.04  0.99   < > 0.68  CALCIUM 7.3*   < > 8.1*  MG 1.6*  --  2.0  AST 69*  --   --   ALT 66*  --   --   ALKPHOS 36*  --   --   BILITOT 0.7  --   --    < > = values in this interval not displayed.   Cardiac Enzymes Recent Labs  Lab 07/04/17 1200  TROPONINI 2.89*   RADIOLOGY:  Dg Chest Port 1 View  Result Date: 07/04/2017 CLINICAL DATA:  Extubation EXAM: PORTABLE CHEST 1 VIEW COMPARISON:  07/03/2017 FINDINGS: Interval extubation. Mild increase in basilar atelectasis. no pneumothorax. No pulmonary edema. Removal of NG tube. Central venous line unchanged. IMPRESSION: Extubation with mild increase in atelectasis. Electronically Signed   By: Suzy Bouchard M.D.   On: 07/04/2017 07:07   Dg Chest Port 1 View  Result Date: 07/03/2017 CLINICAL DATA:  Respiratory distress EXAM: PORTABLE CHEST 1 VIEW  COMPARISON:  07/02/2017 FINDINGS: Endotracheal tube, nasogastric catheter and right jugular central line are again seen and stable. Cardiac shadow is stable. The overall inspiratory effort is again poor without focal infiltrate or sizable effusion. No bony abnormality is noted. IMPRESSION: Poor inspiratory effort.  No acute abnormality noted. Electronically Signed   By: Inez Catalina M.D.   On: 07/03/2017 08:12   ASSESSMENT AND PLAN:  61 year old male with history of essential hypertension who presents after cardiac arrest.  1. Cardiac arrest due to non-ST elevation MI status post cardiac  catheterization Cardiac catheter shows mild LV systolic distant with occluded circumflex and right coronary artery which appeared to be old and have collateral blood flow from left anterior descending artery. Diagonal artery with some atherosclerosis and possible culprit lesion but small artery not amenable and not appropriate for PCI and stent placement. Left anterior descending artery with modest LAD stenosis and not requiring further intervention  Appreciate cardiology consultation Transition from heparin drip to  aspirin and Plavix. Elevated troponin noted on December 5. Plan is medical management. Continue aspirin, Plavix, atorvastatin, metoprolol, nitroglycerin  2. Acute hypoxic respiratory failure due to cardiac arrest/aspiration pneumonia: Continue Zosyn for aspiration pneumonia extubated  3. Hyperglycemia:  A1c 5.6  Continue sliding scale  4. Acute metabolic encephalopathy due to cardiac arrest: Alert and  O x3  5. Essential hypertension: Blood pressure is improved Continue Norvasc and metoprolol  PPI added to prevent stress-induced gastritis/ulcers  Management plans discussed with Dr Jefferson Fuel     Case discussed with Care Management/Social Worker. Management plans discussed with the patient, family and they are in agreement.  CODE STATUS: full  DVT Prophylaxis: lovenox  TOTAL TIME TAKING CARE OF THIS PATIENT: *25* minutes.  >50% time spent on counselling and coordination of care  POSSIBLE D/C IN *1-2* DAYS, DEPENDING ON CLINICAL CONDITION.  Note: This dictation was prepared with Dragon dictation along with smaller phrase technology. Any transcriptional errors that result from this process are unintentional.  Fritzi Mandes M.D on 07/04/2017 at 1:09 PM  Between 7am to 6pm - Pager - [phone]  After 6pm go to [email or web address] - password [email or web address] [facility]  Office  [phone]  CC: Primary care physician; Venia Carbon, MD

## 2017-07-04 NOTE — Progress Notes (Signed)
Patient had a mostly uneventful shift. Currently on room air, all VSS, foley was taken out. Arterial line was taken out. Patient had 699mL of UO. Continues to have back and rib pain. Received 2mg  of morphine. Patient has gotten up and into the chair. He has also used the bsc twice today. Started on a heart healthy diet. Nausea has gone down. No emesis this shift.

## 2017-07-04 NOTE — Progress Notes (Addendum)
PULMONARY / CRITICAL CARE MEDICINE   Name: Jason Gross MRN: [id number] DOB: 02-Apr-1956    ADMISSION DATE:  06/30/2017  PT PROFILE:   21 M with minimal PMH admitted after out of hospital cardiac arrest (shockable rhythm - presumed VF). 10-15 mins of pulselessness. Wife administered chest compressions awaiting EMS. TTM protocol (36 degrees) initiated.   MAJOR EVENTS/TEST RESULTS: 12/04 Admission as above 12/04 CT head/neck: no acute findings 12/04 CTA chest/abd/pelvis: No CT evidence for dissection or other acute aortic pathology. Acute nondisplaced fractures of the left anterior third through sixth ribs, which may be related to chest compressions given history of recent CPR. Layering secretions within the subglottic trachea and right mainstem bronchus. Associated bibasilar atelectasis. No other acute abnormality within the chest, abdomen, and pelvis 12/04 LHC: Three-vessel coronary artery disease, including moderate diffuse mid and distal LAD disease, 95% D1 stenosis, and chronic total occlusions of the proximal/mid LCx and proximal/mid RCA. Suspect culprit vessel may have been D1, as other occlusions are well-collateralized and likely chronic. Med rx recommended 12/05 Echocardiogram: LVEF 65-70%. No regional wall motion abnormalities. No significant valvular abnormalities  12/05 Rewarming. Off vasopressors.  12/05 PM: Again developed severe ventilator dyssynchrony despite heavy level of sedation. NMB resumed. EKG with ischemic changes across the precordium. Metoprolol and NTG paste initiated 12/06 following commands.  Troponin I elevated. Tolerated SBT X 45 mins but developed agitation with hypertension, ischemic changes on EKG. NTG gtt initiated. Sedation resumed. Then NTG gtt stopped due to decreasing BP. Increasing respiratory secretions (mucopurulent)  INDWELLING DEVICES:: ETT 12/04 >>  R IJ CVL 12/04 >>  R radial A-line 12/04 >>   MICRO DATA: MRSA PCR 12/04 >> NEG Urine 12/04 >>  NEG Resp 12/04 >> NOF Blood 12/04 >>  resp 12/05 >>   ANTIMICROBIALS:  Pip-tazo 12/04 >>   SUBJECTIVE:  Patient is doing well this morning, status post successful extubation, states that his nausea and vomiting is somewhat improved but still present. Also complaining of significant back discomfort   VITAL SIGNS: BP (!) 142/71   Pulse 82   Temp (!) 101.1 F (38.4 C)   Resp 18   Ht 5\' 11"  (1.803 m)   Wt 101.1 kg (222 lb 14.2 oz)   SpO2 98%   BMI 31.09 kg/m   INTAKE / OUTPUT: I/O last 3 completed shifts: In: 1959.2 [I.V.:1499.2; NG/GT:310; IV Piggyback:150] Out: 6387 [Urine:5205; Emesis/NG output:1690]  PHYSICAL EXAMINATION: General: Awake and alert, appears comfortable without any respiratory distress Neuro: CNs intact, moves all extremities HEENT: NCAT, sclerae white Cardiovascular: Regular, no M noted Lungs: Bilateral rhonchi Abdomen: Soft, diminished bowel sounds Extremities: No edema  LABS:  BMET Recent Labs  Lab 07/02/17 0401 07/03/17 0430 07/04/17 0502  NA 134* 136 141  K 4.1 4.2 3.9  CL 104 100* 103  CO2 21* 29 27  BUN 18 29* 26*  CREATININE 0.62 0.74 0.68  GLUCOSE 116* 113* 99    Electrolytes Recent Labs  Lab 06/30/17 0600  07/02/17 0401 07/03/17 0430 07/04/17 0502  CALCIUM 7.3*   < > 7.2* 7.5* 8.1*  MG 1.6*  --   --   --  2.0  PHOS 3.3  --   --   --  1.6*   < > = values in this interval not displayed.    CBC Recent Labs  Lab 07/02/17 0401 07/03/17 0136 07/04/17 0502  WBC 8.4 9.9 10.9*  HGB 11.6* 11.5* 12.3*  HCT 33.5* 33.2* 34.7*  PLT 145* 181 207  Coag's Recent Labs  Lab 06/30/17 0600  APTT 27  INR 1.13    Sepsis Markers Recent Labs  Lab 06/30/17 0200 06/30/17 0600 07/01/17 0341 07/02/17 0401  LATICACIDVEN 5.0* 2.0*  --   --   PROCALCITON  --  0.60 1.34 1.02    ABG Recent Labs  Lab 06/30/17 0200 06/30/17 0610 07/03/17 1235  PHART 7.28* 7.25* 7.41  PCO2ART 43 54* 52*  PO2ART 92 233* 91    Liver  Enzymes Recent Labs  Lab 06/30/17 0126 06/30/17 0600  AST 74* 69*  ALT 68* 66*  ALKPHOS 51 36*  BILITOT 1.0 0.7  ALBUMIN 3.7 3.2*    Cardiac Enzymes Recent Labs  Lab 07/02/17 1539 07/03/17 0430 07/04/17 0502  TROPONINI 3.98* 4.32* 3.14*    Glucose Recent Labs  Lab 07/03/17 1127 07/03/17 1557 07/03/17 1952 07/03/17 2334 07/04/17 0445 07/04/17 0732  GLUCAP 111* 116* 118* 109* 93 97    CXR: Low volumes with bilateral basilar atelectasis    ASSESSMENT / PLAN:  PULMONARY A: Patient is status post successful extubation. Presently on nasal cannula will wean as tolerated, albuterol and Atrovent as needed.  CARDIOVASCULAR A:  Cardiac arrest due to acute coronary syndrome NSTEMI Three-vessel coronary disease Hypertension P:  Continue ASA, heparin Troponin is 3.14, peak troponin was 4.32, EKG revealed evolved inferior wall injury pattern. Metoprolol initiated 12/05.  Dose increased 12/06 Amlodipine initiated 12/06 Continue hydralazine as needed to maintain SBP <160 mmHg Metoprolol IV as needed to maintain HR <100/min Cardiology following -discussed with Dr. [name].  He reviewed cath films.  No intervention planned.   INFECTIOUS A:   Aspiration pneumonia P:   Monitor temp, WBC count Micro and abx as above Presently on Zosyn   NEUROLOGIC A:  Encephalopathy has resolved. Presently awake alert oriented and communicating Critical care time 35 minutes  Hermelinda Dellen, DO    07/04/2017, 8:03 AM

## 2017-07-05 LAB — GLUCOSE, CAPILLARY
Glucose-Capillary: 100 mg/dL — ABNORMAL HIGH (ref 65–99)
Glucose-Capillary: 100 mg/dL — ABNORMAL HIGH (ref 65–99)
Glucose-Capillary: 101 mg/dL — ABNORMAL HIGH (ref 65–99)
Glucose-Capillary: 106 mg/dL — ABNORMAL HIGH (ref 65–99)
Glucose-Capillary: 81 mg/dL (ref 65–99)
Glucose-Capillary: 92 mg/dL (ref 65–99)

## 2017-07-05 LAB — HEPARIN LEVEL (UNFRACTIONATED)
HEPARIN UNFRACTIONATED: 0.3 [IU]/mL (ref 0.30–0.70)
HEPARIN UNFRACTIONATED: 0.3 [IU]/mL (ref 0.30–0.70)

## 2017-07-05 LAB — CULTURE, BLOOD (ROUTINE X 2)
CULTURE: NO GROWTH
CULTURE: NO GROWTH
SPECIAL REQUESTS: ADEQUATE
SPECIAL REQUESTS: ADEQUATE

## 2017-07-05 LAB — CBC
HCT: 32.3 % — ABNORMAL LOW (ref 40.0–52.0)
HEMOGLOBIN: 11.4 g/dL — AB (ref 13.0–18.0)
MCH: 29.9 pg (ref 26.0–34.0)
MCHC: 35.3 g/dL (ref 32.0–36.0)
MCV: 84.7 fL (ref 80.0–100.0)
Platelets: 190 10*3/uL (ref 150–440)
RBC: 3.81 MIL/uL — AB (ref 4.40–5.90)
RDW: 13.5 % (ref 11.5–14.5)
WBC: 8.6 10*3/uL (ref 3.8–10.6)

## 2017-07-05 LAB — URINE CULTURE: Culture: NO GROWTH

## 2017-07-05 LAB — PHOSPHORUS: PHOSPHORUS: 1.5 mg/dL — AB (ref 2.5–4.6)

## 2017-07-05 LAB — PROCALCITONIN: PROCALCITONIN: 0.24 ng/mL

## 2017-07-05 MED ORDER — AMOXICILLIN-POT CLAVULANATE 875-125 MG PO TABS
1.0000 | ORAL_TABLET | Freq: Two times a day (BID) | ORAL | Status: DC
  Administered 2017-07-05 (×2): 1 via ORAL
  Filled 2017-07-05 (×2): qty 1

## 2017-07-05 MED ORDER — LISINOPRIL 10 MG PO TABS
10.0000 mg | ORAL_TABLET | Freq: Every day | ORAL | Status: DC
  Administered 2017-07-05 – 2017-07-07 (×3): 10 mg via ORAL
  Filled 2017-07-05 (×3): qty 1

## 2017-07-05 MED ORDER — K PHOS MONO-SOD PHOS DI & MONO 155-852-130 MG PO TABS
500.0000 mg | ORAL_TABLET | ORAL | Status: AC
  Administered 2017-07-05 (×4): 500 mg via ORAL
  Filled 2017-07-05 (×4): qty 2

## 2017-07-05 MED ORDER — NITROGLYCERIN 0.4 MG SL SUBL: 0.4000 mg | SUBLINGUAL_TABLET | SUBLINGUAL | Status: DC | PRN

## 2017-07-05 MED ORDER — ASPIRIN 81 MG PO CHEW: 81.0000 mg | CHEWABLE_TABLET | Freq: Every day | ORAL | Status: DC

## 2017-07-05 MED ORDER — CLOPIDOGREL BISULFATE 75 MG PO TABS
75.0000 mg | ORAL_TABLET | Freq: Every day | ORAL | Status: DC
Start: 2017-07-05 — End: 2017-07-07
  Administered 2017-07-05 – 2017-07-07 (×3): 75 mg via ORAL
  Filled 2017-07-05 (×3): qty 1

## 2017-07-05 MED ORDER — SODIUM CHLORIDE 0.9% FLUSH
3.0000 mL | Freq: Two times a day (BID) | INTRAVENOUS | Status: DC
  Administered 2017-07-05 – 2017-07-06 (×4): 3 mL via INTRAVENOUS

## 2017-07-05 NOTE — Progress Notes (Signed)
[facility] at [place] NAME: Jason Gross    MR#:  [id number]  DATE OF BIRTH:  01-04-1956  SUBJECTIVE:  Out in the chair. Rib pain Wife in the room REVIEW OF SYSTEMS:   Review of Systems  Constitutional: Negative for chills, fever and weight loss.  HENT: Negative for ear discharge, ear pain and nosebleeds.   Eyes: Negative for blurred vision, pain and discharge.  Respiratory: Negative for sputum production, shortness of breath, wheezing and stridor.   Cardiovascular: Negative for chest pain, palpitations, orthopnea and PND.  Gastrointestinal: Negative for abdominal pain, diarrhea, nausea and vomiting.  Genitourinary: Negative for frequency and urgency.  Musculoskeletal: Positive for back pain. Negative for joint pain.  Neurological: Positive for weakness. Negative for sensory change, speech change and focal weakness.  Psychiatric/Behavioral: Negative for depression and hallucinations. The patient is not nervous/anxious.    Tolerating Diet:yesTolerating PT: pending  DRUG ALLERGIES:  No Known Allergies  VITALS:  Blood pressure 134/73, pulse 68, temperature 97.8 F (36.6 C), temperature source Oral, resp. rate 20, height 5\' 11"  (1.803 m), weight 99.6 kg (219 lb 8 oz), SpO2 95 %.  PHYSICAL EXAMINATION:   Physical Exam  GENERAL:  61 y.o.-year-old patient lying in the bed with no acute distress.  EYES: Pupils equal, round, reactive to light and accommodation. No scleral icterus. Extraocular muscles intact.  HEENT: Head atraumatic, normocephalic. Oropharynx and nasopharynx clear.  NECK:  Supple, no jugular venous distention. No thyroid enlargement, no tenderness.  LUNGS: Normal breath sounds bilaterally, no wheezing, rales, rhonchi. No use of accessory muscles of respiration.  CARDIOVASCULAR: S1, S2 normal. No murmurs, rubs, or gallops.  ABDOMEN: Soft, nontender, nondistended. Bowel sounds present. No organomegaly or mass.   EXTREMITIES: No cyanosis, clubbing or edema b/l.    NEUROLOGIC: Cranial nerves II through XII are intact. No focal Motor or sensory deficits b/l.   PSYCHIATRIC:  patient is alert and oriented x 3.  SKIN: No obvious rash, lesion, or ulcer.   LABORATORY PANEL:  CBC Recent Labs  Lab 07/05/17 0411  WBC 8.6  HGB 11.4*  HCT 32.3*  PLT 190    Chemistries  Recent Labs  Lab 06/30/17 0600  07/04/17 0502  NA 136   < > 141  K 4.3   < > 3.9  CL 108   < > 103  CO2 23   < > 27  GLUCOSE 171*   < > 99  BUN 14   < > 26*  CREATININE 1.04  0.99   < > 0.68  CALCIUM 7.3*   < > 8.1*  MG 1.6*  --  2.0  AST 69*  --   --   ALT 66*  --   --   ALKPHOS 36*  --   --   BILITOT 0.7  --   --    < > = values in this interval not displayed.   Cardiac Enzymes Recent Labs  Lab 07/04/17 1730  TROPONINI 2.13*   RADIOLOGY:  Dg Chest Port 1 View  Result Date: 07/04/2017 CLINICAL DATA:  Extubation EXAM: PORTABLE CHEST 1 VIEW COMPARISON:  07/03/2017 FINDINGS: Interval extubation. Mild increase in basilar atelectasis. no pneumothorax. No pulmonary edema. Removal of NG tube. Central venous line unchanged. IMPRESSION: Extubation with mild increase in atelectasis. Electronically Signed   By: Suzy Bouchard M.D.   On: 07/04/2017 07:07   ASSESSMENT AND PLAN:  61 year old male with history of essential hypertension who presents after cardiac  arrest.  1. Cardiac arrest due to non-ST elevation MI status post cardiac catheterization -Cardiac catheter shows mild LV systolic distant with occluded circumflex and right coronary artery which appeared to be old and have collateral blood flow from left anterior descending artery. Diagonal artery with some atherosclerosis and possible culprit lesion but small artery not amenable and not appropriate for PCI and stent placement. Left anterior descending artery with modest LAD stenosis and not requiring further intervention -medical mnx -Transition from heparin drip to   aspirin and Plavix. Elevated troponin noted on December 5. -Continue aspirin, Plavix, atorvastatin, metoprolol, nitroglycerin -I will d/c heparin gtt  2. Acute hypoxic respiratory failure due to cardiac arrest/aspiration pneumonia: Continue Zosyn for aspiration pneumonia---change to po augmentin Extubated and improving  3. Hyperglycemia:  A1c 5.6  D/c ssi  4. Acute metabolic encephalopathy due to cardiac arrest: Alert and  O x3  5. Essential hypertension: Blood pressure is improved Continue Norvasc and metoprolol  PPI added to prevent stress-induced gastritis/ulcers   Start PT  Management plans discussed with pt, wife and son    Case discussed with Care Management/Social Worker. Management plans discussed with the patient, family and they are in agreement.  CODE STATUS: full  DVT Prophylaxis: lovenox  TOTAL TIME TAKING CARE OF THIS PATIENT: *25* minutes.  >50% time spent on counselling and coordination of care  POSSIBLE D/C IN *1-2* DAYS, DEPENDING ON CLINICAL CONDITION.  Note: This dictation was prepared with Dragon dictation along with smaller phrase technology. Any transcriptional errors that result from this process are unintentional.  Fritzi Mandes M.D on 07/05/2017 at 1:18 PM  Between 7am to 6pm - Pager - [phone]  After 6pm go to [email or web address] - password [email or web address] [facility]  Office  [phone]  CC: Primary care physician; Venia Carbon, MD

## 2017-07-05 NOTE — Progress Notes (Signed)
Pulmonary/critical care  Upon my arrival to the intensive care unit Mr. Jason Gross was discharged to a floor bed. He did well over the past 2 days successfully extubated without any further difficulties. We'll be available if there are any issues please reconsult if can be of any assistance.  Hermelinda Dellen, D.O.

## 2017-07-05 NOTE — Progress Notes (Deleted)
ANTICOAGULATION CONSULT NOTE - FOLLOW UP  Consult  Pharmacy Consult for heparin Indication: chest pain/ACS s/p cardiac arrest  No Known Allergies  Patient Measurements: Height: 5\' 11"  (180.3 cm) Weight: 219 lb 8 oz (99.6 kg) IBW/kg (Calculated) : 75.3 Heparin Dosing Weight: 97 kg  Vital Signs: Temp: 97.8 F (36.6 C) (12/09 0814) Temp Source: Oral (12/09 0814) BP: 136/74 (12/09 0814) Pulse Rate: 68 (12/09 0814)  Labs: Recent Labs    07/03/17 0136 07/03/17 0430  07/04/17 0502 07/04/17 1200 07/04/17 1730 07/05/17 0411 07/05/17 1002  HGB 11.5*  --   --  12.3*  --   --  11.4*  --   HCT 33.2*  --   --  34.7*  --   --  32.3*  --   PLT 181  --   --  207  --   --  190  --   HEPARINUNFRC 0.44  --    < > 0.36  --   --  0.30 0.30  CREATININE  --  0.74  --  0.68  --   --   --   --   TROPONINI  --  4.32*  --  3.14* 2.89* 2.13*  --   --    < > = values in this interval not displayed.    Estimated Creatinine Clearance: 118.1 mL/min (by C-G formula based on SCr of 0.68 mg/dL).   Medical History: Past Medical History:  Diagnosis Date  . Allergy   . Benign mass of parotid gland   . GERD (gastroesophageal reflux disease)   . Hyperlipidemia    not on meds  . Hypertension   . Vertigo     Medications:  Scheduled:  . aspirin EC  81 mg Oral Daily  . atorvastatin  40 mg Oral q1800  . clopidogrel  75 mg Oral Daily  . famotidine  20 mg Oral Daily  . lisinopril  10 mg Oral Daily  . metoprolol tartrate  50 mg Oral BID  . phosphorus  500 mg Oral Q4H  . sodium chloride flush  3 mL Intravenous Q12H    Assessment: Patient admitted for cardiac arrest trops up to 4.92. Patient is on TTM and is now s/p cath who will need CABG or PCI.  Goal of Therapy:  Heparin level 0.3-0.7 units/ml Monitor platelets by anticoagulation protocol: Yes   Plan:  Heparin level this AM falsely low but WNL upon redraw. Will continue heparin infusion at current rate and f/u AM labs.   12/8 @ 0500 HL  0.36 therapeutic. Will continue current rate and will recheck w/ am labs.  12/9 @ 0400 HL 0.30 therapeutic, but trending down, will increase slightly to 1700 units/hr and recheck HL @ 1000.  12/9 @ 10:02 HL 0.30.  Will continue current drip rate. Will recheck HL with am labs.  Olivia Canter, [facility] Clinical Pharmacist 07/05/2017, 11:39 AM

## 2017-07-05 NOTE — Progress Notes (Signed)
Per Dr. Jerelyn Charles it is ok to change CBG from Q4H to AC/HS.

## 2017-07-05 NOTE — Progress Notes (Signed)
MEDICATION RELATED CONSULT NOTE - FOLLOW UP   Pharmacy Consult for electrolyte management Indication: hypophosphatemia  No Known Allergies  Patient Measurements: Height: 5\' 11"  (180.3 cm) Weight: 219 lb 8 oz (99.6 kg) IBW/kg (Calculated) : 75.3 Adjusted Body Weight:   Vital Signs: Temp: 97.8 F (36.6 C) (12/09 0814) Temp Source: Oral (12/09 0814) BP: 136/74 (12/09 0814) Pulse Rate: 68 (12/09 0814) Intake/Output from previous day: 12/08 0701 - 12/09 0700 In: 2258 [I.V.:2128; IV Piggyback:100] Out: 700 [Urine:700] Intake/Output from this shift: Total I/O In: 240 [P.O.:240] Out: -   Labs: Recent Labs    07/03/17 0136 07/03/17 0430 07/04/17 0502 07/05/17 0411  WBC 9.9  --  10.9* 8.6  HGB 11.5*  --  12.3* 11.4*  HCT 33.2*  --  34.7* 32.3*  PLT 181  --  207 190  CREATININE  --  0.74 0.68  --   MG  --   --  2.0  --   PHOS  --   --  1.6* 1.5*   Estimated Creatinine Clearance: 118.1 mL/min (by C-G formula based on SCr of 0.68 mg/dL).   Microbiology: Recent Results (from the past 720 hour(s))  MRSA PCR Screening     Status: None   Collection Time: 06/30/17  4:55 AM  Result Value Ref Range Status   MRSA by PCR NEGATIVE NEGATIVE Final    Comment:        The GeneXpert MRSA Assay (FDA approved for NASAL specimens only), is one component of a comprehensive MRSA colonization surveillance program. It is not intended to diagnose MRSA infection nor to guide or monitor treatment for MRSA infections.   Culture, blood (Routine X 2) w Reflex to ID Panel     Status: None   Collection Time: 06/30/17  7:50 AM  Result Value Ref Range Status   Specimen Description BLOOD LEFT ANTECUBITAL  Final   Special Requests   Final    BOTTLES DRAWN AEROBIC AND ANAEROBIC Blood Culture adequate volume   Culture NO GROWTH 5 DAYS  Final   Report Status 07/05/2017 FINAL  Final  Culture, blood (Routine X 2) w Reflex to ID Panel     Status: None   Collection Time: 06/30/17  7:50 AM  Result  Value Ref Range Status   Specimen Description BLOOD BLOOD RIGHT HAND  Final   Special Requests   Final    BOTTLES DRAWN AEROBIC AND ANAEROBIC Blood Culture adequate volume   Culture NO GROWTH 5 DAYS  Final   Report Status 07/05/2017 FINAL  Final  Culture, respiratory (NON-Expectorated)     Status: None   Collection Time: 06/30/17  8:51 AM  Result Value Ref Range Status   Specimen Description TRACHEAL ASPIRATE  Final   Special Requests NONE  Final   Gram Stain   Final    ABUNDANT WBC PRESENT,BOTH PMN AND MONONUCLEAR ABUNDANT GRAM POSITIVE COCCI IN PAIRS IN CHAINS ABUNDANT GRAM POSITIVE RODS    Culture   Final    Consistent with normal respiratory flora. Performed at [facility], [place] [address] [place], [place] [address]    Report Status 07/02/2017 FINAL  Final  Urine Culture     Status: None   Collection Time: 06/30/17  3:40 PM  Result Value Ref Range Status   Specimen Description URINE, RANDOM  Final   Special Requests NONE  Final   Culture   Final    NO GROWTH Performed at [facility], [place],  [place] [address]    Report Status 07/02/2017 FINAL  Final  Culture, respiratory (NON-Expectorated)     Status: None   Collection Time: 07/01/17 11:29 AM  Result Value Ref Range Status   Specimen Description TRACHEAL ASPIRATE  Final   Special Requests Normal  Final   Gram Stain   Final    RARE WBC PRESENT, PREDOMINANTLY PMN RARE GRAM POSITIVE COCCI IN PAIRS RARE GRAM POSITIVE RODS    Culture   Final    Consistent with normal respiratory flora. Performed at [facility], [place] [address] [place], [place] [address]    Report Status 07/03/2017 FINAL  Final  Culture, blood (Routine X 2) w Reflex to ID Panel     Status: None (Preliminary result)   Collection Time: 07/04/17  6:40 AM  Result Value Ref Range Status   Specimen Description BLOOD BLOOD LEFT HAND  Final   Special Requests   Final    BOTTLES DRAWN AEROBIC AND ANAEROBIC Blood Culture  adequate volume   Culture NO GROWTH < 24 HOURS  Final   Report Status PENDING  Incomplete  Culture, blood (Routine X 2) w Reflex to ID Panel     Status: None (Preliminary result)   Collection Time: 07/04/17  6:46 AM  Result Value Ref Range Status   Specimen Description BLOOD LEFT ANTECUBITAL  Final   Special Requests   Final    BOTTLES DRAWN AEROBIC AND ANAEROBIC Blood Culture adequate volume   Culture NO GROWTH < 24 HOURS  Final   Report Status PENDING  Incomplete    Medications:  Infusions:    Assessment: 17 yom with cardiac arrest d/t NSTEMI sp cath and now extubated and transferred to medical bed. Phos noted to be 1.5, pharmacy consulted to manage electrolytes.  1208  K 3.9,  Mg 2,  Ca 8.1,  Adjusted Ca 8.7,  Phos 1.6 1209                                                                   Phos 1.5  Goal of Therapy:  K 3.5 to 5 Mg 1.7 to 2.4 Ca 8.9 to 10.3 Phos 2.5 to 4.6  Plan:  Patient is taking oral medications adequately, will use oral supplementation. K Phos Neutral tabs 500 mg po Q4H x 4 doses, recheck all electrolytes tomorrow with AM labs.  Laural Benes, Pharm.D., BCPS Clinical Pharmacist 07/05/2017,10:58 AM

## 2017-07-05 NOTE — Progress Notes (Signed)
ANTICOAGULATION CONSULT NOTE - FOLLOW UP  Consult  Pharmacy Consult for heparin Indication: chest pain/ACS s/p cardiac arrest  No Known Allergies  Patient Measurements: Height: 5\' 11"  (180.3 cm) Weight: 221 lb 1.9 oz (100.3 kg) IBW/kg (Calculated) : 75.3 Heparin Dosing Weight: 97 kg  Vital Signs: Temp: 99 F (37.2 C) (12/09 0400) Temp Source: Oral (12/09 0400) BP: 125/67 (12/09 0500) Pulse Rate: 75 (12/09 0500)  Labs: Recent Labs    07/03/17 0136 07/03/17 0430  07/03/17 1100 07/04/17 0502 07/04/17 1200 07/04/17 1730 07/05/17 0411  HGB 11.5*  --   --   --  12.3*  --   --  11.4*  HCT 33.2*  --   --   --  34.7*  --   --  32.3*  PLT 181  --   --   --  207  --   --  190  HEPARINUNFRC 0.44  --    < > 0.49 0.36  --   --  0.30  CREATININE  --  0.74  --   --  0.68  --   --   --   TROPONINI  --  4.32*  --   --  3.14* 2.89* 2.13*  --    < > = values in this interval not displayed.    Estimated Creatinine Clearance: 118.5 mL/min (by C-G formula based on SCr of 0.68 mg/dL).   Medical History: Past Medical History:  Diagnosis Date  . Allergy   . Benign mass of parotid gland   . GERD (gastroesophageal reflux disease)   . Hyperlipidemia    not on meds  . Hypertension   . Vertigo     Medications:  Scheduled:  . amLODipine  10 mg Oral Daily  . aspirin EC  81 mg Oral Daily  . atorvastatin  40 mg Oral q1800  . famotidine  20 mg Oral Daily  . fentaNYL  12.5 mcg Transdermal Q72H  . insulin aspart  0-9 Units Subcutaneous Q4H  . metoprolol tartrate  50 mg Oral BID  . nitroGLYCERIN  1 inch Topical Q8H  . pantoprazole (PROTONIX) IV  40 mg Intravenous Q12H  . sodium chloride flush  10-40 mL Intracatheter Q12H    Assessment: Patient admitted for cardiac arrest trops up to 4.92. Patient is on TTM and is now s/p cath who will need CABG or PCI.  Goal of Therapy:  Heparin level 0.3-0.7 units/ml Monitor platelets by anticoagulation protocol: Yes   Plan:  Heparin level this  AM falsely low but WNL upon redraw. Will continue heparin infusion at current rate and f/u AM labs.   12/8 @ 0500 HL 0.36 therapeutic. Will continue current rate and will recheck w/ am labs.  12/9 @ 0400 HL 0.30 therapeutic, but trending down, will increase slightly to 1700 units/hr and recheck HL @ 1000.  Tobie Lords, PharmD, BCPS Clinical Pharmacist 07/05/2017

## 2017-07-06 LAB — GLUCOSE, CAPILLARY
Glucose-Capillary: 109 mg/dL — ABNORMAL HIGH (ref 65–99)
Glucose-Capillary: 118 mg/dL — ABNORMAL HIGH (ref 65–99)
Glucose-Capillary: 114 mg/dL — ABNORMAL HIGH (ref 65–99)

## 2017-07-06 LAB — CBC
HCT: 36.5 % — ABNORMAL LOW (ref 40.0–52.0)
Hemoglobin: 12.8 g/dL — ABNORMAL LOW (ref 13.0–18.0)
MCH: 29.6 pg (ref 26.0–34.0)
MCHC: 35 g/dL (ref 32.0–36.0)
MCV: 84.6 fL (ref 80.0–100.0)
PLATELETS: 212 10*3/uL (ref 150–440)
RBC: 4.31 MIL/uL — AB (ref 4.40–5.90)
RDW: 13.7 % (ref 11.5–14.5)
WBC: 8.5 10*3/uL (ref 3.8–10.6)

## 2017-07-06 LAB — BASIC METABOLIC PANEL
ANION GAP: 11 (ref 5–15)
BUN: 19 mg/dL (ref 6–20)
CALCIUM: 8.4 mg/dL — AB (ref 8.9–10.3)
CO2: 25 mmol/L (ref 22–32)
CREATININE: 0.7 mg/dL (ref 0.61–1.24)
Chloride: 103 mmol/L (ref 101–111)
Glucose, Bld: 98 mg/dL (ref 65–99)
Potassium: 2.9 mmol/L — ABNORMAL LOW (ref 3.5–5.1)
SODIUM: 139 mmol/L (ref 135–145)

## 2017-07-06 LAB — PHOSPHORUS: PHOSPHORUS: 3.3 mg/dL (ref 2.5–4.6)

## 2017-07-06 LAB — PROCALCITONIN: PROCALCITONIN: 0.14 ng/mL

## 2017-07-06 LAB — POTASSIUM: POTASSIUM: 3.8 mmol/L (ref 3.5–5.1)

## 2017-07-06 LAB — MAGNESIUM: MAGNESIUM: 1.9 mg/dL (ref 1.7–2.4)

## 2017-07-06 MED ORDER — POTASSIUM CHLORIDE 10 MEQ/100ML IV SOLN
10.0000 meq | INTRAVENOUS | Status: AC
  Administered 2017-07-06 (×2): 10 meq via INTRAVENOUS
  Filled 2017-07-06 (×2): qty 100

## 2017-07-06 MED ORDER — PREMIER PROTEIN SHAKE
11.0000 [oz_av] | Freq: Two times a day (BID) | ORAL | Status: DC
  Administered 2017-07-06 – 2017-07-07 (×2): 11 [oz_av] via ORAL

## 2017-07-06 MED ORDER — POTASSIUM CHLORIDE CRYS ER 20 MEQ PO TBCR
40.0000 meq | EXTENDED_RELEASE_TABLET | ORAL | Status: AC
  Administered 2017-07-06: 40 meq via ORAL
  Filled 2017-07-06: qty 2

## 2017-07-06 NOTE — Evaluation (Signed)
Physical Therapy Evaluation Patient Details Name: Jason Gross MRN: [id number] DOB: 1956/06/29 Today's Date: 07/06/2017   History of Present Illness  presented to ER after sudden collapse in home environment (wife initiated CPR, received 2 shocks in field); admitted with for management of cardiac arrest, CODE ICE initiated.  Intubated 12/4-7; cardiac cath significant for 2-vessel disease, but not appropriate for stent/intervention per cardiology.  Clinical Impression  Upon evaluation, patient alert and oriented; follows all commands and demonstrates good safety awareness/insight.  Bilat UE/LE strength and ROM grossly symmetrical and WFL; no focal weakness, sensory or coordination deficits noted.  Demonstrates ability to complete sit/stand, basic transfers and gait (600') without assist device, mod indep; decreased cadence, but steady without buckling, LOB or safety concern.  Vitals stable and WFL (HR 80-100s); appropriate chronotropic response to activity without reports of chest pain/discomfort.  BORG 5/10 after gait efforts; reviewed role of activity pacing throughout recovery.  Patient/family voiced understanding and agreement. No acute PT needs identified at this time.  Okay to discharge home without PT follow up when medically appropriate; may benefit from referral to cardiac rehab services.   Follow Up Recommendations No PT follow up    Equipment Recommendations       Recommendations for Other Services (outpatient cardiac rehab)     Precautions / Restrictions Precautions Precautions: Fall Restrictions Weight Bearing Restrictions: No      Mobility  Bed Mobility Overal bed mobility: Modified Independent                Transfers Overall transfer level: Modified independent Equipment used: None             General transfer comment: minimal/no use of UEs to assist with lift off  Ambulation/Gait Ambulation/Gait assistance: Modified independent (Device/Increase  time) Ambulation Distance (Feet): 600 Feet Assistive device: None       General Gait Details: reciprocal stepping pattern with good trunk rotation, arm swing; decreased cadence but steady without buckling, LOB or safety concerns.  Vitals stable and WFL; appropriate chronotropic response to activity without reports of chest pain/discomfort.  Stairs            Wheelchair Mobility    Modified Rankin (Stroke Patients Only)       Balance Overall balance assessment: Modified Independent                                           Pertinent Vitals/Pain Pain Assessment: No/denies pain    Home Living Family/patient expects to be discharged to:: Private residence Living Arrangements: Spouse/significant other Available Help at Discharge: Family Type of Home: House       Home Layout: (living in basement of family home; no essential needs upstairs) Home Equipment: None      Prior Function Level of Independence: Independent         Comments: Indep with ADLs, household and community mobilization without assist device     Hand Dominance        Extremity/Trunk Assessment   Upper Extremity Assessment Upper Extremity Assessment: Overall WFL for tasks assessed(not tested with resistance secondary to acute rib fractures (After CPR))    Lower Extremity Assessment Lower Extremity Assessment: Overall WFL for tasks assessed(grossly 4+ to 5/5 throughout)       Communication   Communication: No difficulties  Cognition Arousal/Alertness: Awake/alert Behavior During Therapy: WFL for tasks assessed/performed Overall Cognitive Status: Within Functional  Limits for tasks assessed                                        General Comments      Exercises     Assessment/Plan    PT Assessment Patent does not need any further PT services  PT Problem List         PT Treatment Interventions Gait training;Functional mobility  training;Therapeutic activities    PT Goals (Current goals can be found in the Care Plan section)  Acute Rehab PT Goals Patient Stated Goal: to return home PT Goal Formulation: All assessment and education complete, DC therapy Time For Goal Achievement: 07/06/17 Potential to Achieve Goals: Good    Frequency     Barriers to discharge        Co-evaluation               AM-PAC PT "6 Clicks" Daily Activity  Outcome Measure Difficulty turning over in bed (including adjusting bedclothes, sheets and blankets)?: None Difficulty moving from lying on back to sitting on the side of the bed? : None Difficulty sitting down on and standing up from a chair with arms (e.g., wheelchair, bedside commode, etc,.)?: None Help needed moving to and from a bed to chair (including a wheelchair)?: None Help needed walking in hospital room?: None Help needed climbing 3-5 steps with a railing? : None 6 Click Score: 24    End of Session Equipment Utilized During Treatment: Gait belt Activity Tolerance: Patient tolerated treatment well Patient left: in chair;with call bell/phone within reach;with family/visitor present Nurse Communication: Mobility status PT Visit Diagnosis: Difficulty in walking, not elsewhere classified (R26.2)    Time: 0737-1062 PT Time Calculation (min) (ACUTE ONLY): 18 min   Charges:   PT Evaluation $PT Eval Low Complexity: 1 Low     PT G Codes:   PT G-Codes **NOT FOR INPATIENT CLASS** Functional Assessment Tool Used: AM-PAC 6 Clicks Basic Mobility Functional Limitation: Mobility: Walking and moving around Mobility: Walking and Moving Around Current Status (I9485): 0 percent impaired, limited or restricted Mobility: Walking and Moving Around Goal Status (I6270): 0 percent impaired, limited or restricted Mobility: Walking and Moving Around Discharge Status (J5009): 0 percent impaired, limited or restricted    Chelbi Herber H. Owens Shark, PT, DPT, NCS 07/06/17, 11:19  AM [phone]

## 2017-07-06 NOTE — Progress Notes (Signed)
MEDICATION RELATED CONSULT NOTE - FOLLOW UP   Pharmacy Consult for electrolyte management Indication: hypophosphatemia  No Known Allergies  Patient Measurements: Height: 5\' 11"  (180.3 cm) Weight: 216 lb 3 oz (98.1 kg) IBW/kg (Calculated) : 75.3  Vital Signs: Temp: 98 F (36.7 C) (12/10 0840) Temp Source: Oral (12/10 0840) BP: 138/63 (12/10 0840) Pulse Rate: 78 (12/10 0840) Intake/Output from previous day: 12/09 0701 - 12/10 0700 In: 960 [P.O.:960] Out: -  Intake/Output from this shift: Total I/O In: 240 [P.O.:240] Out: -   Labs: Recent Labs    07/04/17 0502 07/05/17 0411 07/06/17 0620  WBC 10.9* 8.6 8.5  HGB 12.3* 11.4* 12.8*  HCT 34.7* 32.3* 36.5*  PLT 207 190 212  CREATININE 0.68  --  0.70  MG 2.0  --  1.9  PHOS 1.6* 1.5* 3.3   Estimated Creatinine Clearance: 117.2 mL/min (by C-G formula based on SCr of 0.7 mg/dL).   Assessment: 59 yom with cardiac arrest d/t NSTEMI sp cath and now extubated and transferred to medical bed. Phos noted to be 1.5, pharmacy consulted to manage electrolytes.  Goal of Therapy:  K 3.5 to 5 Mg 1.7 to 2.4 Ca 8.9 to 10.3 Phos 2.5 to 4.6  Plan:  K = 2.9 is low.  Phos = 3.3, Mg = 1.9 are WNL  Ordered KCl 40 mEq PO + 20 mEq IV replacement. Will recheck K this afternoon and all other electrolytes with AM labs tomorrow.  12/10 1500 K 3.8. No further supplement necessary at this point. Will f/u electrolytes tomorrow with AM labs.  Laural Benes, Pharm.D., BCPS Clinical Pharmacist 07/06/2017,4:17 PM

## 2017-07-06 NOTE — Progress Notes (Signed)
MEDICATION RELATED CONSULT NOTE - FOLLOW UP   Pharmacy Consult for electrolyte management Indication: hypophosphatemia  No Known Allergies  Patient Measurements: Height: 5\' 11"  (180.3 cm) Weight: 216 lb 3 oz (98.1 kg) IBW/kg (Calculated) : 75.3  Vital Signs: Temp: 98 F (36.7 C) (12/10 0840) Temp Source: Oral (12/10 0840) BP: 138/63 (12/10 0840) Pulse Rate: 78 (12/10 0840) Intake/Output from previous day: 12/09 0701 - 12/10 0700 In: 960 [P.O.:960] Out: -  Intake/Output from this shift: No intake/output data recorded.  Labs: Recent Labs    07/04/17 0502 07/05/17 0411 07/06/17 0620  WBC 10.9* 8.6 8.5  HGB 12.3* 11.4* 12.8*  HCT 34.7* 32.3* 36.5*  PLT 207 190 212  CREATININE 0.68  --  0.70  MG 2.0  --  1.9  PHOS 1.6* 1.5* 3.3   Estimated Creatinine Clearance: 117.2 mL/min (by C-G formula based on SCr of 0.7 mg/dL).   Assessment: 60 yom with cardiac arrest d/t NSTEMI sp cath and now extubated and transferred to medical bed. Phos noted to be 1.5, pharmacy consulted to manage electrolytes.  Goal of Therapy:  K 3.5 to 5 Mg 1.7 to 2.4 Ca 8.9 to 10.3 Phos 2.5 to 4.6  Plan:  K = 2.9 is low.  Phos = 3.3, Mg = 1.9 are WNL  Ordered KCl 40 mEq PO + 20 mEq IV replacement. Will recheck K this afternoon and all other electrolytes with AM labs tomorrow.  Lenis Noon, Pharm.D., BCPS Clinical Pharmacist 07/06/2017,1:37 PM

## 2017-07-06 NOTE — Progress Notes (Signed)
Nutrition Follow Up Note   DOCUMENTATION CODES:   Obesity unspecified  INTERVENTION:   Premier Protein BID, each supplement provides 160 kcal and 30 grams of protein.   NUTRITION DIAGNOSIS:   Inadequate oral intake related to inability to eat as evidenced by NPO status. -resolving   GOAL:   Patient will meet greater than or equal to 90% of their needs  MONITOR:   PO intake, Supplement acceptance, Labs, Weight trends, I & O's   ASSESSMENT:   61 year old male with PMHx of HTN, GERD, HLD admitted with acute NSTEMI with acute cardiac arrest s/p CPR initially by wife before EMS arrived and intubation. There is suspected anoxic brain injury and code ice was initiated.   -Patient underwent cardiac catheterization 12/4 showing mild LV systolic dysfunction and significant two-vessel CAD. LAD artery without critical stenosis. No further stenting or intervention at this time.  Pt doing well; eating 50-100% of meals. RD will add Premier Protein to help pt meet his estimated protein needs. Per chart, pt with 19lb weight loss since admit. Pt with improved edema. Pt back down to around his UBW. Pt with low potassium today; recommend monitor K, P, and Mg and replete as necessary.     Medications reviewed and include: aspirin, plavix, pepcid  Labs reviewed: K 2.9(L), Ca 8.4(L), P 3.3 wnl, Mg 1.9 wnl  Diet Order:  Diet Heart Room service appropriate? Yes; Fluid consistency: Thin  EDUCATION NEEDS:   No education needs have been identified at this time  Skin: Reviewed RN Assessment  Last BM:  12/9- type 5  Height:   Ht Readings from Last 1 Encounters:  07/05/17 5\' 11"  (1.803 m)    Weight:   Wt Readings from Last 1 Encounters:  07/06/17 216 lb 3 oz (98.1 kg)    Ideal Body Weight:  78.2 kg  BMI:  Body mass index is 30.15 kg/m.  Estimated Nutritional Needs:   Kcal:  2000-2300kcal/day   Protein:  98-108g/day   Fluid:  >2L/day   Koleen Distance MS, RD, LDN Pager #[phone] After Hours Pager: [phone]

## 2017-07-06 NOTE — Progress Notes (Signed)
OT Cancellation Note  Patient Details Name: Jason Gross MRN: [id number] DOB: April 25, 1956   Cancelled Treatment:     Attempted OT evaluation this date.  Patient screened and has no OT needs currently.  He has walked around the nurses station 6 times and has been able to go back and forth to the bathroom, independent with dressing.  If needs arise, please reconsult.  Marielouise Amey T Renetta Suman, OTR/L, CLT   Johny Pitstick 07/06/2017, 2:39 PM

## 2017-07-06 NOTE — Plan of Care (Signed)
  Clinical Measurements: Ability to maintain clinical measurements within normal limits will improve 07/06/2017 1622 - Not Progressing by Daron Offer, RN Note Potassium today = only 2.9. PO & IV replacement given. Already corrected. Will continue to monitor. Wenda Low [facility]

## 2017-07-06 NOTE — Progress Notes (Signed)
[place] [facility] [place] NAME: Jason Gross    MR#:  [id number]  DATE OF BIRTH:  Feb 21, 1956  SUBJECTIVE:  Out in the chair. Rib pain Wife in the room REVIEW OF SYSTEMS:   Review of Systems  Constitutional: Negative for chills, fever and weight loss.  HENT: Negative for ear discharge, ear pain and nosebleeds.   Eyes: Negative for blurred vision, pain and discharge.  Respiratory: Negative for sputum production, shortness of breath, wheezing and stridor.   Cardiovascular: Negative for chest pain, palpitations, orthopnea and PND.  Gastrointestinal: Negative for abdominal pain, diarrhea, nausea and vomiting.  Genitourinary: Negative for frequency and urgency.  Musculoskeletal: Positive for back pain. Negative for joint pain.  Neurological: Positive for weakness. Negative for sensory change, speech change and focal weakness.  Psychiatric/Behavioral: Negative for depression and hallucinations. The patient is not nervous/anxious.    Tolerating Diet:yesTolerating PT: pending  DRUG ALLERGIES:  No Known Allergies  VITALS:  Blood pressure (!) 149/81, pulse 66, temperature 98.5 F (36.9 C), temperature source Oral, resp. rate 20, height 5\' 11"  (1.803 m), weight 98.1 kg (216 lb 3 oz), SpO2 97 %.  PHYSICAL EXAMINATION:   Physical Exam  GENERAL:  61 y.o.-year-old patient lying in the bed with no acute distress.  EYES: Pupils equal, round, reactive to light and accommodation. No scleral icterus. Extraocular muscles intact.  HEENT: Head atraumatic, normocephalic. Oropharynx and nasopharynx clear.  NECK:  Supple, no jugular venous distention. No thyroid enlargement, no tenderness.  LUNGS: Normal breath sounds bilaterally, no wheezing, rales, rhonchi. No use of accessory muscles of respiration.  CARDIOVASCULAR: S1, S2 normal. No murmurs, rubs, or gallops.  ABDOMEN: Soft, nontender, nondistended. Bowel sounds present. No organomegaly or mass.   EXTREMITIES: No cyanosis, clubbing or edema b/l.    NEUROLOGIC: Cranial nerves II through XII are intact. No focal Motor or sensory deficits b/l.   PSYCHIATRIC:  patient is alert and oriented x 3.  SKIN: No obvious rash, lesion, or ulcer.   LABORATORY PANEL:  CBC Recent Labs  Lab 07/06/17 0620  WBC 8.5  HGB 12.8*  HCT 36.5*  PLT 212    Chemistries  Recent Labs  Lab 06/30/17 0600  07/06/17 0620  NA 136   < > 139  K 4.3   < > 2.9*  CL 108   < > 103  CO2 23   < > 25  GLUCOSE 171*   < > 98  BUN 14   < > 19  CREATININE 1.04  0.99   < > 0.70  CALCIUM 7.3*   < > 8.4*  MG 1.6*   < > 1.9  AST 69*  --   --   ALT 66*  --   --   ALKPHOS 36*  --   --   BILITOT 0.7  --   --    < > = values in this interval not displayed.   Cardiac Enzymes Recent Labs  Lab 07/04/17 1730  TROPONINI 2.13*   RADIOLOGY:  No results found. ASSESSMENT AND PLAN:  61 year old male with history of essential hypertension who presents after cardiac arrest.  1. Cardiac arrest due to non-ST elevation MI status post cardiac catheterization -Cardiac catheter shows mild LV systolic distant with occluded circumflex and right coronary artery which appeared to be old and have collateral blood flow from left anterior descending artery. Diagonal artery with some atherosclerosis and possible culprit lesion but small artery not amenable and not appropriate  for PCI and stent placement. Left anterior descending artery with modest LAD stenosis and not requiring further intervention -medical mnx -Transition from heparin drip to  aspirin and Plavix. Elevated troponin noted on December 5. -Continue aspirin, Plavix, atorvastatin, metoprolol, nitroglycerin -I will d/c heparin gtt  2. Acute hypoxic respiratory failure due to cardiac arrest/aspiration pneumonia: Continue Zosyn for aspiration pneumonia---change to po augmentin Extubated and improving  3. Hyperglycemia:  A1c 5.6  D/c ssi  4. Acute metabolic  encephalopathy due to cardiac arrest: Alert and  O x3  5. Essential hypertension: Blood pressure is improved Continue Norvasc and metoprolol  6.Low K and phosphorus--repleting    PT to see today Wife request pt stay one more day since the weather is sill bad and there is lot of snow in there area which is not cleared up and unable to go today.  Management plans discussed with pt, wife and son    Case discussed with Care Management/Social Worker. Management plans discussed with the patient, family and they are in agreement.  CODE STATUS: full  DVT Prophylaxis: lovenox  TOTAL TIME TAKING CARE OF THIS PATIENT: 60 minutes.  >50% time spent on counselling and coordination of care  POSSIBLE D/C IN *1-2* DAYS, DEPENDING ON CLINICAL CONDITION.  Note: This dictation was prepared with Dragon dictation along with smaller phrase technology. Any transcriptional errors that result from this process are unintentional.  Fritzi Mandes M.D on 07/06/2017 at 8:36 AM  Between 7am to 6pm - Pager - [phone]  After 6pm go to [email or web address] - password [email or web address] [facility]  Office  [phone]  CC: Primary care physician; Venia Carbon, MD

## 2017-07-07 LAB — BLOOD GAS, ARTERIAL
Acid-Base Excess: 7 mmol/L — ABNORMAL HIGH (ref 0.0–2.0)
BICARBONATE: 33 mmol/L — AB (ref 20.0–28.0)
FIO2: 0.3
O2 Saturation: 97.1 %
PEEP: 5 cmH2O
PH ART: 7.41 (ref 7.350–7.450)
Patient temperature: 37
Pressure support: 5 cmH2O
pCO2 arterial: 52 mmHg — ABNORMAL HIGH (ref 32.0–48.0)
pO2, Arterial: 91 mmHg (ref 83.0–108.0)

## 2017-07-07 LAB — PHOSPHORUS: Phosphorus: 3.3 mg/dL (ref 2.5–4.6)

## 2017-07-07 LAB — POTASSIUM: POTASSIUM: 3.2 mmol/L — AB (ref 3.5–5.1)

## 2017-07-07 LAB — MAGNESIUM: MAGNESIUM: 1.9 mg/dL (ref 1.7–2.4)

## 2017-07-07 MED ORDER — METOPROLOL TARTRATE 50 MG PO TABS
50.0000 mg | ORAL_TABLET | Freq: Two times a day (BID) | ORAL | 0 refills | Status: DC
Start: 2017-07-07 — End: 2017-07-14

## 2017-07-07 MED ORDER — POTASSIUM CHLORIDE CRYS ER 20 MEQ PO TBCR
40.0000 meq | EXTENDED_RELEASE_TABLET | ORAL | Status: AC
  Administered 2017-07-07: 40 meq via ORAL
  Filled 2017-07-07: qty 2

## 2017-07-07 MED ORDER — HYDROCODONE-ACETAMINOPHEN 5-325 MG PO TABS: 1.0000 | ORAL_TABLET | Freq: Two times a day (BID) | ORAL | 0 refills | Status: DC | PRN

## 2017-07-07 MED ORDER — CLOPIDOGREL BISULFATE 75 MG PO TABS: 75.0000 mg | ORAL_TABLET | Freq: Every day | ORAL | 0 refills | Status: DC

## 2017-07-07 MED ORDER — GUAIFENESIN-CODEINE 100-10 MG/5ML PO SOLN: 10.0000 mL | ORAL | 0 refills | Status: DC | PRN

## 2017-07-07 MED ORDER — POTASSIUM CHLORIDE CRYS ER 20 MEQ PO TBCR
20.0000 meq | EXTENDED_RELEASE_TABLET | ORAL | Status: DC
  Filled 2017-07-07: qty 1

## 2017-07-07 MED ORDER — IBUPROFEN 400 MG PO TABS: 400.0000 mg | ORAL_TABLET | Freq: Four times a day (QID) | ORAL | 0 refills | Status: DC | PRN

## 2017-07-07 MED ORDER — ASPIRIN 81 MG PO TBEC: 81.0000 mg | DELAYED_RELEASE_TABLET | Freq: Every day | ORAL | 0 refills | Status: AC

## 2017-07-07 MED ORDER — ATORVASTATIN CALCIUM 40 MG PO TABS: 40.0000 mg | ORAL_TABLET | Freq: Every day | ORAL | 0 refills | Status: DC

## 2017-07-07 NOTE — Care Management (Signed)
No discharge needs identified by members of the care team 

## 2017-07-07 NOTE — Progress Notes (Signed)
MEDICATION RELATED CONSULT NOTE - FOLLOW UP   Pharmacy Consult for electrolyte management Indication: hypophosphatemia  No Known Allergies  Patient Measurements: Height: 5\' 11"  (180.3 cm) Weight: 214 lb 8 oz (97.3 kg) IBW/kg (Calculated) : 75.3  Vital Signs: Temp: 98.4 F (36.9 C) (12/11 0422) Temp Source: Oral (12/11 0422) BP: 159/80 (12/11 0422) Pulse Rate: 65 (12/11 0422) Intake/Output from previous day: 12/10 0701 - 12/11 0700 In: 240 [P.O.:240] Out: -  Intake/Output from this shift: No intake/output data recorded.  Labs: Recent Labs    07/05/17 0411 07/06/17 0620 07/07/17 0518  WBC 8.6 8.5  --   HGB 11.4* 12.8*  --   HCT 32.3* 36.5*  --   PLT 190 212  --   CREATININE  --  0.70  --   MG  --  1.9 1.9  PHOS 1.5* 3.3 3.3   Estimated Creatinine Clearance: 116.8 mL/min (by C-G formula based on SCr of 0.7 mg/dL).   Assessment: 56 yom with cardiac arrest d/t NSTEMI sp cath and now extubated and transferred to medical bed. Phos noted to be 1.5, pharmacy consulted to manage electrolytes.  Goal of Therapy:  K 3.5 to 5 Mg 1.7 to 2.4 Ca 8.9 to 10.3 Phos 2.5 to 4.6  Plan:  K = 2.9 is low.  Phos = 3.3, Mg = 1.9 are WNL  Ordered KCl 40 mEq PO + 20 mEq IV replacement. Will recheck K this afternoon and all other electrolytes with AM labs tomorrow.  12/10 1500 K 3.8. No further supplement necessary at this point. Will f/u electrolytes tomorrow with AM labs.  12/11 0518 K 3.2, Phos 3.3, Mg 1.9. Give potassium chloride 20 mEq po Q2H x 2 doses, recheck potassium this evening at 1800, and recheck all electrolytes tomorrow with AM labs.   Laural Benes, Pharm.D., BCPS Clinical Pharmacist 07/07/2017,7:01 AM

## 2017-07-07 NOTE — Progress Notes (Signed)
Patient discharged home as ordered,instructions explained and well understood,,vital signe within norm al limits,escorted by spouse and Engineer, manufacturing via wheel chair

## 2017-07-07 NOTE — Discharge Instructions (Signed)
Heart Attack A heart attack (myocardial infarction, MI) causes damage to the heart that cannot be fixed. A heart attack often happens when a blood clot or other blockage cuts blood flow to the heart. When this happens, certain areas of the heart begin to die. This causes the pain you feel during a heart attack. Follow these instructions at home:  Take medicine as told by your doctor. You may need medicine to: ? Keep your blood from clotting too easily. ? Control your blood pressure. ? Lower your cholesterol. ? Control abnormal heart rhythms.  Change certain behaviors as told by your doctor. This may include: ? Quitting smoking. ? Being active. ? Eating a heart-healthy diet. Ask your doctor for help with this diet. ? Keeping a healthy weight. ? Keeping your diabetes under control. ? Lessening stress. ? Limiting how much alcohol you drink. Do not take these medicines unless your doctor says that you can:  Nonsteroidal anti-inflammatory drugs (NSAIDs). These include: ? Ibuprofen. ? Naproxen. ? Celecoxib.  Vitamin supplements that have vitamin A, vitamin E, or both.  Hormone therapy that contains estrogen with or without progestin.  Get help right away if:  You have sudden chest discomfort.  You have sudden discomfort in your: ? Arms. ? Back. ? Neck. ? Jaw.  You have shortness of breath at any time.  You have sudden sweating or clammy skin.  You feel sick to your stomach (nauseous) or throw up (vomit).  You suddenly get light-headed or dizzy.  You feel your heart beating fast or skipping beats. These symptoms may be an emergency. Do not wait to see if the symptoms will go away. Get medical help right away. Call your local emergency services (911 in the [place] Do not drive yourself to the hospital. This information is not intended to replace advice given to you by your health care provider. Make sure you discuss any questions you have with your health care  provider. Document Released: 01/13/2012 Document Revised: 12/20/2015 Document Reviewed: 09/16/2013 Elsevier Interactive Patient Education  2017 Elsevier Inc.  

## 2017-07-08 ENCOUNTER — Telehealth: Payer: Self-pay | Admitting: *Deleted

## 2017-07-08 NOTE — Telephone Encounter (Signed)
Lm requesting return call to complete TCM and confirm hosp f/u appt  

## 2017-07-09 LAB — CULTURE, BLOOD (ROUTINE X 2)
Culture: NO GROWTH
Culture: NO GROWTH
SPECIAL REQUESTS: ADEQUATE
SPECIAL REQUESTS: ADEQUATE

## 2017-07-09 NOTE — Telephone Encounter (Signed)
Lm requesting return call to complete TCM and confirm hosp f/u appt  

## 2017-07-09 NOTE — Discharge Summary (Signed)
[facility] [facility] [place] NAME: Jason Gross    MR#:  [id number]  DATE OF BIRTH:  03/18/1956  DATE OF ADMISSION:  06/30/2017   ADMITTING PHYSICIAN: Harrie Foreman, MD  DATE OF DISCHARGE: 07/07/2017 10:00 AM  PRIMARY CARE PHYSICIAN: Venia Carbon, MD   ADMISSION DIAGNOSIS:  Cardiac arrest ([place]) [I46.9] Unresponsive [R41.89] Syncope, unspecified syncope type [R55] DISCHARGE DIAGNOSIS:  Active Problems:   Cardiac arrest ([place])   Non-ST elevation (NSTEMI) myocardial infarction [facility]  SECONDARY DIAGNOSIS:   Past Medical History:  Diagnosis Date  . Allergy   . Benign mass of parotid gland   . GERD (gastroesophageal reflux disease)   . Hyperlipidemia    not on meds  . Hypertension   . Vertigo    HOSPITAL COURSE:  61 year old male with history of essential hypertension admitted s/p cardiac arrest.  1. Cardiac arrest due to non-ST elevation MI status post cardiac catheterization -Cardiac catheter shows mild LV systolic distant with occluded circumflex and right coronary artery which appeared to be old and have collateral blood flow from left anterior descending artery. Diagonal artery with some atherosclerosis and possible culprit lesion but small artery not amenable and not appropriate for PCI and stent placement. Left anterior descending artery with modest LAD stenosis and not requiring further intervention -medical mnx -Transitioned from heparin drip to  aspirin and Plavix. -Continue aspirin, Plavix, atorvastatin, metoprolol  2. Acute hypoxic respiratory failure due to cardiac arrest/aspiration pneumonia: - treated  3. Hyperglycemia:  A1c 5.6  - no DM  4. Acute metabolic encephalopathy due to cardiac arrest: - Resolved  5. Essential hypertension: Blood pressure is improved Continue Lisinopril-HCTZ and metoprolol  6.Low K and phosphorus--replaced DISCHARGE CONDITIONS:  stable CONSULTS OBTAINED:  Treatment Team:    Corey Skains, MD DRUG ALLERGIES:  No Known Allergies DISCHARGE MEDICATIONS:   Allergies as of 07/07/2017   No Known Allergies     Medication List    TAKE these medications   aspirin 81 MG EC tablet Take 1 tablet (81 mg total) by mouth daily.   atorvastatin 40 MG tablet Commonly known as:  LIPITOR Take 1 tablet (40 mg total) by mouth daily at 6 PM.   cetirizine 10 MG tablet Commonly known as:  ZYRTEC Take 10 mg by mouth daily.   clopidogrel 75 MG tablet Commonly known as:  PLAVIX Take 1 tablet (75 mg total) by mouth daily.   guaiFENesin-codeine 100-10 MG/5ML syrup Take 10 mLs by mouth every 4 (four) hours as needed for cough.   HYDROcodone-acetaminophen 5-325 MG tablet Commonly known as:  NORCO Take 1 tablet by mouth every 12 (twelve) hours as needed for severe pain.   ibuprofen 400 MG tablet Commonly known as:  ADVIL,MOTRIN Take 1 tablet (400 mg total) by mouth every 6 (six) hours as needed. What changed:    medication strength  how much to take   lisinopril-hydrochlorothiazide 10-12.5 MG tablet Commonly known as:  PRINZIDE,ZESTORETIC TAKE 1 TABLET DAILY   metoprolol tartrate 50 MG tablet Commonly known as:  LOPRESSOR Take 1 tablet (50 mg total) by mouth 2 (two) times daily.        DISCHARGE INSTRUCTIONS:   DIET:  Cardiac diet DISCHARGE CONDITION:  Good ACTIVITY:  Activity as tolerated OXYGEN:  Home Oxygen: No.  Oxygen Delivery: room air DISCHARGE LOCATION:  home   If you experience worsening of your admission symptoms, develop shortness of breath, life threatening emergency, suicidal or homicidal thoughts you  must seek medical attention immediately by calling 911 or calling your MD immediately  if symptoms less severe.  You Must read complete instructions/literature along with all the possible adverse reactions/side effects for all the Medicines you take and that have been prescribed to you. Take any new Medicines after you have  completely understood and accpet all the possible adverse reactions/side effects.   Please note  You were cared for by a hospitalist during your hospital stay. If you have any questions about your discharge medications or the care you received while you were in the hospital after you are discharged, you can call the unit and asked to speak with the hospitalist on call if the hospitalist that took care of you is not available. Once you are discharged, your primary care physician will handle any further medical issues. Please note that NO REFILLS for any discharge medications will be authorized once you are discharged, as it is imperative that you return to your primary care physician (or establish a relationship with a primary care physician if you do not have one) for your aftercare needs so that they can reassess your need for medications and monitor your lab values.    On the day of Discharge:  VITAL SIGNS:  Blood pressure (!) 156/71, pulse 68, temperature 98.2 F (36.8 C), temperature source Oral, resp. rate 17, height 5\' 11"  (1.803 m), weight 97.3 kg (214 lb 8 oz), SpO2 100 %. PHYSICAL EXAMINATION:  GENERAL:  61 y.o.-year-old patient lying in the bed with no acute distress.  EYES: Pupils equal, round, reactive to light and accommodation. No scleral icterus. Extraocular muscles intact.  HEENT: Head atraumatic, normocephalic. Oropharynx and nasopharynx clear.  NECK:  Supple, no jugular venous distention. No thyroid enlargement, no tenderness.  LUNGS: Normal breath sounds bilaterally, no wheezing, rales,rhonchi or crepitation. No use of accessory muscles of respiration.  CARDIOVASCULAR: S1, S2 normal. No murmurs, rubs, or gallops.  ABDOMEN: Soft, non-tender, non-distended. Bowel sounds present. No organomegaly or mass.  EXTREMITIES: No pedal edema, cyanosis, or clubbing.  NEUROLOGIC: Cranial nerves II through XII are intact. Muscle strength 5/5 in all extremities. Sensation intact. Gait not  checked.  PSYCHIATRIC: The patient is alert and oriented x 3.  SKIN: No obvious rash, lesion, or ulcer.  DATA REVIEW:   CBC Recent Labs  Lab 07/06/17 0620  WBC 8.5  HGB 12.8*  HCT 36.5*  PLT 212    Chemistries  Recent Labs  Lab 07/06/17 0620  07/07/17 0518  NA 139  --   --   K 2.9*   < > 3.2*  CL 103  --   --   CO2 25  --   --   GLUCOSE 98  --   --   BUN 19  --   --   CREATININE 0.70  --   --   CALCIUM 8.4*  --   --   MG 1.9  --  1.9   < > = values in this interval not displayed.     Follow-up Information    Venia Carbon, MD. Go in 1 week.   Specialties:  Internal Medicine, Pediatrics Why:  Office will call patient to schedule appointment. Contact information: [place] [address] [phone]        Corey Skains, MD. Schedule an appointment as soon as possible for a visit in 2 weeks.   Specialty:  Cardiology Contact information: [address] [facility] [place] [address]  [phone]           Management plans discussed with the patient, family and they are in agreement.  CODE STATUS: Prior   TOTAL TIME TAKING CARE OF THIS PATIENT: 45 minutes.    Max Sane M.D on 07/09/2017 at 4:54 PM  Between 7am to 6pm - Pager - [phone]  After 6pm go to [email or web address] - Proofreader  [facility]  Office  [phone]  CC: Primary care physician; Venia Carbon, MD   Note: This dictation was prepared with Dragon dictation along with smaller phrase technology. Any transcriptional errors that result from this process are unintentional.

## 2017-07-13 ENCOUNTER — Observation Stay
Admission: EM | Admit: 2017-07-13 | Discharge: 2017-07-14 | Disposition: A | Discharge status: Home / Self Care | Payer: No Typology Code available for payment source | Attending: Internal Medicine | Admitting: Internal Medicine

## 2017-07-13 ENCOUNTER — Inpatient Hospital Stay: Payer: No Typology Code available for payment source

## 2017-07-13 ENCOUNTER — Other Ambulatory Visit: Payer: Self-pay

## 2017-07-13 ENCOUNTER — Emergency Department: Payer: No Typology Code available for payment source

## 2017-07-13 ENCOUNTER — Encounter: Payer: Self-pay | Admitting: Emergency Medicine

## 2017-07-13 DIAGNOSIS — K219 Gastro-esophageal reflux disease without esophagitis: Secondary | ICD-10-CM | POA: Insufficient documentation

## 2017-07-13 DIAGNOSIS — E785 Hyperlipidemia, unspecified: Secondary | ICD-10-CM | POA: Insufficient documentation

## 2017-07-13 DIAGNOSIS — Z87891 Personal history of nicotine dependence: Secondary | ICD-10-CM | POA: Insufficient documentation

## 2017-07-13 DIAGNOSIS — R531 Weakness: Secondary | ICD-10-CM | POA: Insufficient documentation

## 2017-07-13 DIAGNOSIS — I252 Old myocardial infarction: Secondary | ICD-10-CM | POA: Insufficient documentation

## 2017-07-13 DIAGNOSIS — Z7982 Long term (current) use of aspirin: Secondary | ICD-10-CM | POA: Diagnosis not present

## 2017-07-13 DIAGNOSIS — Z8674 Personal history of sudden cardiac arrest: Secondary | ICD-10-CM | POA: Insufficient documentation

## 2017-07-13 DIAGNOSIS — Z79899 Other long term (current) drug therapy: Secondary | ICD-10-CM | POA: Diagnosis not present

## 2017-07-13 DIAGNOSIS — I951 Orthostatic hypotension: Secondary | ICD-10-CM | POA: Diagnosis not present

## 2017-07-13 DIAGNOSIS — R55 Syncope and collapse: Secondary | ICD-10-CM | POA: Diagnosis present

## 2017-07-13 LAB — HEPATIC FUNCTION PANEL
ALBUMIN: 3.5 g/dL (ref 3.5–5.0)
ALK PHOS: 111 U/L (ref 38–126)
ALT: 42 U/L (ref 17–63)
AST: 21 U/L (ref 15–41)
BILIRUBIN TOTAL: 0.7 mg/dL (ref 0.3–1.2)
Bilirubin, Direct: 0.2 mg/dL (ref 0.1–0.5)
Indirect Bilirubin: 0.5 mg/dL (ref 0.3–0.9)
Total Protein: 7.1 g/dL (ref 6.5–8.1)

## 2017-07-13 LAB — CBC
HCT: 37.9 % — ABNORMAL LOW (ref 40.0–52.0)
Hemoglobin: 13.3 g/dL (ref 13.0–18.0)
MCH: 30.1 pg (ref 26.0–34.0)
MCHC: 35 g/dL (ref 32.0–36.0)
MCV: 85.8 fL (ref 80.0–100.0)
PLATELETS: 403 10*3/uL (ref 150–440)
RBC: 4.42 MIL/uL (ref 4.40–5.90)
RDW: 14 % (ref 11.5–14.5)
WBC: 9.6 10*3/uL (ref 3.8–10.6)

## 2017-07-13 LAB — BASIC METABOLIC PANEL
Anion gap: 7 (ref 5–15)
BUN: 30 mg/dL — ABNORMAL HIGH (ref 6–20)
CALCIUM: 8.6 mg/dL — AB (ref 8.9–10.3)
CO2: 26 mmol/L (ref 22–32)
CREATININE: 1.15 mg/dL (ref 0.61–1.24)
Chloride: 103 mmol/L (ref 101–111)
GFR calc non Af Amer: >60 mL/min (ref >60)
Glucose, Bld: 115 mg/dL — ABNORMAL HIGH (ref 65–99)
Potassium: 4 mmol/L (ref 3.5–5.1)
SODIUM: 136 mmol/L (ref 135–145)

## 2017-07-13 LAB — TSH: TSH: 3.217 u[IU]/mL (ref 0.350–4.500)

## 2017-07-13 LAB — URINALYSIS, COMPLETE (UACMP) WITH MICROSCOPIC
BILIRUBIN URINE: NEGATIVE
Bacteria, UA: NONE SEEN
Glucose, UA: NEGATIVE mg/dL
HGB URINE DIPSTICK: NEGATIVE
Ketones, ur: NEGATIVE mg/dL
LEUKOCYTES UA: NEGATIVE
Nitrite: NEGATIVE
PH: 5 (ref 5.0–8.0)
Protein, ur: NEGATIVE mg/dL
SPECIFIC GRAVITY, URINE: 1.011 (ref 1.005–1.030)

## 2017-07-13 LAB — TROPONIN I: Troponin I: <0.03 ng/mL (ref <0.03)

## 2017-07-13 MED ORDER — ONDANSETRON HCL 4 MG/2ML IJ SOLN: 4.0000 mg | Freq: Four times a day (QID) | INTRAMUSCULAR | Status: DC | PRN

## 2017-07-13 MED ORDER — LORATADINE 10 MG PO TABS
10.0000 mg | ORAL_TABLET | Freq: Every day | ORAL | Status: DC
  Administered 2017-07-14: 10 mg via ORAL
  Filled 2017-07-13: qty 1

## 2017-07-13 MED ORDER — ACETAMINOPHEN 325 MG PO TABS: 650.0000 mg | ORAL_TABLET | Freq: Four times a day (QID) | ORAL | Status: DC | PRN

## 2017-07-13 MED ORDER — HYDROCODONE-ACETAMINOPHEN 5-325 MG PO TABS
1.0000 | ORAL_TABLET | ORAL | Status: DC | PRN
  Administered 2017-07-13: 1 via ORAL
  Filled 2017-07-13: qty 1

## 2017-07-13 MED ORDER — ACETAMINOPHEN 650 MG RE SUPP: 650.0000 mg | Freq: Four times a day (QID) | RECTAL | Status: DC | PRN

## 2017-07-13 MED ORDER — ENOXAPARIN SODIUM 40 MG/0.4ML ~~LOC~~ SOLN
40.0000 mg | SUBCUTANEOUS | Status: DC
  Filled 2017-07-13: qty 0.4

## 2017-07-13 MED ORDER — SODIUM CHLORIDE 0.9 % IV SOLN
INTRAVENOUS | Status: AC
  Administered 2017-07-13: 16:00:00 via INTRAVENOUS

## 2017-07-13 MED ORDER — ASPIRIN EC 81 MG PO TBEC
81.0000 mg | DELAYED_RELEASE_TABLET | Freq: Every day | ORAL | Status: DC
  Administered 2017-07-14: 81 mg via ORAL
  Filled 2017-07-13: qty 1

## 2017-07-13 MED ORDER — TRAZODONE HCL 50 MG PO TABS: 50.0000 mg | ORAL_TABLET | Freq: Every evening | ORAL | Status: DC | PRN

## 2017-07-13 MED ORDER — CLOPIDOGREL BISULFATE 75 MG PO TABS
75.0000 mg | ORAL_TABLET | Freq: Every day | ORAL | Status: DC
  Administered 2017-07-14: 75 mg via ORAL
  Filled 2017-07-13: qty 1

## 2017-07-13 MED ORDER — METOPROLOL TARTRATE 25 MG PO TABS
25.0000 mg | ORAL_TABLET | Freq: Two times a day (BID) | ORAL | Status: DC
  Administered 2017-07-13 – 2017-07-14 (×2): 25 mg via ORAL
  Filled 2017-07-13 (×2): qty 1

## 2017-07-13 MED ORDER — POLYETHYLENE GLYCOL 3350 17 G PO PACK: 17.0000 g | PACK | Freq: Every day | ORAL | Status: DC | PRN

## 2017-07-13 MED ORDER — ATORVASTATIN CALCIUM 20 MG PO TABS
40.0000 mg | ORAL_TABLET | Freq: Every day | ORAL | Status: DC
  Administered 2017-07-13: 40 mg via ORAL
  Filled 2017-07-13: qty 4

## 2017-07-13 MED ORDER — SODIUM CHLORIDE 0.9% FLUSH
3.0000 mL | Freq: Two times a day (BID) | INTRAVENOUS | Status: DC
  Administered 2017-07-13: 3 mL via INTRAVENOUS

## 2017-07-13 MED ORDER — ONDANSETRON HCL 4 MG PO TABS: 4.0000 mg | ORAL_TABLET | Freq: Four times a day (QID) | ORAL | Status: DC | PRN

## 2017-07-13 NOTE — ED Notes (Signed)
Awake and alert. NAD 

## 2017-07-13 NOTE — ED Triage Notes (Signed)
Pt arrived to ER via EMS with complaints of weakness, pt coded on Jul 16, 2023. Was discharged on Tuesday. He was at his work turning in paper work and became weak. EMS reports blood pressure was 80's over 40's.

## 2017-07-13 NOTE — Progress Notes (Signed)
Patient admitted to unit. Admitted for near syncope Oriented to room, call bell, and staff. Bed in lowest position. Fall safety plan reviewed. Full assessment to Epic. Skin assessment verified with Mar[place], RN. Telemetry box verification with tele clerk- Box#:[id number]. Will continue to monitor.

## 2017-07-13 NOTE — ED Notes (Signed)
Dr. Jerelyn Charles at bedside at this time.

## 2017-07-13 NOTE — ED Provider Notes (Signed)
[facility] Emergency Department Provider Note  Time seen: 10:06 AM  I have reviewed the triage vital signs and the nursing notes.   HISTORY  Chief Complaint Weakness    HPI Jason Gross is a 61 y.o. male with a past medical history of gastric reflux, hyperlipidemia, hypertension, recent end STEMI with cardiac arrest discharged from the hospital 07/07/17 who presents to the emergency department after syncopal episode.  According to the patient he was at his work turning and paperwork for his recent missed time.  While there he states he became somewhat lightheaded and nauseated, had to sit down in lost consciousness.  Patient did urinate on himself during the episode of syncope.  Upon awakening the patient states he feels well, but feels weak.  Denies any history of syncope in the past.  Patient did recently suffer a cardiac arrest 07/01/17 with CPR, defibrillation, intubation and cooling, ultimately deemed to be due to an NSTEMI per record review.  Patient was discharged on aspirin/Plavix.  Had a cardiac catheterization but the lesion was not amenable to PCI.   Past Medical History:  Diagnosis Date  . Allergy   . Benign mass of parotid gland   . GERD (gastroesophageal reflux disease)   . Hyperlipidemia    not on meds  . Hypertension   . Vertigo     Patient Active Problem List   Diagnosis Date Noted  . Cardiac arrest ([place]) 06/30/2017  . Non-ST elevation (NSTEMI) myocardial infarction ([place])   . Parotid mass 01/07/2017  . Vertigo 10/31/2016  . Routine general medical examination at a health care facility 01/15/2011  . DYSHIDROTIC ECZEMA 07/17/2009  . HYPERLIPIDEMIA 01/01/2009  . ACTINIC KERATOSIS 01/01/2009  . Essential hypertension, benign 05/29/2008  . Allergic rhinitis due to pollen 05/29/2008    Past Surgical History:  Procedure Laterality Date  . LEFT HEART CATH AND CORONARY ANGIOGRAPHY N/A 06/30/2017   Procedure: LEFT HEART CATH AND CORONARY  ANGIOGRAPHY;  Surgeon: Nelva Bush, MD;  Location: [place];  Service: Cardiovascular;  Laterality: N/A;  . PAROTIDECTOMY Left 01/07/2017   Procedure: PAROTIDECTOMY (SUPERFICIAL);  Surgeon: Clyde Canterbury, MD;  Location: [facility] ORS;  Service: ENT;  Laterality: Left;    Prior to Admission medications   Medication Sig Start Date End Date Taking? Authorizing Provider  aspirin EC 81 MG EC tablet Take 1 tablet (81 mg total) by mouth daily. 07/07/17   Max Sane, MD  atorvastatin (LIPITOR) 40 MG tablet Take 1 tablet (40 mg total) by mouth daily at 6 PM. 07/07/17   Max Sane, MD  cetirizine (ZYRTEC) 10 MG tablet Take 10 mg by mouth daily.      [provider]  clopidogrel (PLAVIX) 75 MG tablet Take 1 tablet (75 mg total) by mouth daily. 07/07/17   Max Sane, MD  guaiFENesin-codeine 100-10 MG/5ML syrup Take 10 mLs by mouth every 4 (four) hours as needed for cough. 07/07/17   Max Sane, MD  HYDROcodone-acetaminophen (NORCO) 5-325 MG tablet Take 1 tablet by mouth every 12 (twelve) hours as needed for severe pain. 07/07/17   Max Sane, MD  ibuprofen (ADVIL,MOTRIN) 400 MG tablet Take 1 tablet (400 mg total) by mouth every 6 (six) hours as needed. 07/07/17   Max Sane, MD  lisinopril-hydrochlorothiazide (PRINZIDE,ZESTORETIC) 10-12.5 MG tablet TAKE 1 TABLET DAILY 12/12/16   Venia Carbon, MD  metoprolol tartrate (LOPRESSOR) 50 MG tablet Take 1 tablet (50 mg total) by mouth 2 (two) times daily. 07/07/17   Manuella Ghazi,  Vipul, MD    No Known Allergies  Family History  Problem Relation Age of Onset  . Healthy Mother   . Coronary artery disease Father   . Diabetes Sister   . Diabetes Maternal Uncle   . Coronary artery disease Maternal Grandfather   . Diabetes Maternal Grandfather   . Esophageal cancer Paternal Uncle   . Hypertension Neg Hx   . Cancer Neg Hx   . Colon cancer Neg Hx   . Colon polyps Neg Hx   . Rectal cancer Neg Hx   . Stomach cancer Neg Hx     Social  History Social History   Tobacco Use  . Smoking status: Former Smoker    Last attempt to quit: 07/28/1968    Years since quitting: 48.9  . Smokeless tobacco: Never Used  Substance Use Topics  . Alcohol use: No    Comment: occasional  . Drug use: No    Review of Systems Constitutional: Negative for fever.  Positive for syncope and weakness. Cardiovascular: Negative for chest pain. Respiratory: Negative for shortness of breath. Gastrointestinal: Negative for abdominal pain.  States nausea which is now resolved.  Denies vomiting or diarrhea. Musculoskeletal: Negative for back pain. Neurological: Negative for headache All other ROS negative  ____________________________________________   PHYSICAL EXAM:  VITAL SIGNS: ED Triage Vitals  Enc Vitals Group     BP 07/13/17 0949 128/70     Pulse Rate 07/13/17 0949 (!) 51     Resp --      Temp 07/13/17 0949 98.1 F (36.7 C)     Temp Source 07/13/17 0949 Oral     SpO2 07/13/17 0949 100 %     Weight 07/13/17 0950 210 lb (95.3 kg)     Height 07/13/17 0950 5\' 10"  (1.778 m)     Head Circumference --      Peak Flow --      Pain Score 07/13/17 0948 2     Pain Loc --      Pain Edu? --      Excl. in [place]? --    Constitutional: Alert and oriented. Well appearing and in no distress. Eyes: Normal exam ENT   Head: Normocephalic and atraumatic.   Mouth/Throat: Mucous membranes are moist. Cardiovascular: Normal rate, regular rhythm. No murmur Respiratory: Normal respiratory effort without tachypnea nor retractions. Breath sounds are clear  Gastrointestinal: Soft and nontender. No distention.   Musculoskeletal: Nontender with normal range of motion in all extremities. No lower extremity tenderness or edema. Neurologic:  Normal speech and language. No gross focal neurologic deficits Skin:  Skin is warm, dry and intact.  Psychiatric: Mood and affect are normal.  ____________________________________________    EKG  EKG reviewed and  interpreted by myself shows normal sinus rhythm at 53 bpm with a narrow QRS, mild left axis deviation, largely normal intervals.  The patient does have T wave inversions in the inferolateral leads.  This appears to be new from prior EKG 07/02/17.  However the patient has underwent cardiac catheterization in the interim.  ____________________________________________    RADIOLOGY  Chest x-ray negative  ____________________________________________   INITIAL IMPRESSION / ASSESSMENT AND PLAN / ED COURSE  Pertinent labs & imaging results that were available during my care of the patient were reviewed by me and considered in my medical decision making (see chart for details).  Patient presents to the emergency department after syncopal episode and now generalized fatigue/weakness.  Differential would include MI, ACS, cardiac arrhythmia, vasovagal/orthostatic symptoms, electrolyte  or metabolic abnormality, infectious etiology.  We will check labs, chest x-ray, urinalysis.  I have extensively reviewed the patient's medical records including recent admission with cardiac arrest and cardiac catheterization.  Currently the patient appears well with overall normal vitals and normal physical exam.  Patient's workup is largely within normal limits.  Troponin negative, chest x-ray normal.  Patient does have new EKG changes however he did undergo a cardiac catheterization in the interim.  Patient is feeling well currently however given his recent significant medical history I believe the patient would best be served by admission to the hospital for further monitoring on telemetry and rule out cardiac enzymes.  Patient agreeable to this plan of care. ____________________________________________   FINAL CLINICAL IMPRESSION(S) / ED DIAGNOSES  Syncope Weakness   Harvest Dark, MD 07/13/17 1151

## 2017-07-13 NOTE — ED Notes (Signed)
Pt returned from xray at this time.

## 2017-07-13 NOTE — Plan of Care (Signed)
  Progressing Education: Knowledge of General Education information will improve 07/13/2017 1806 - Progressing by [name], RN Health Behavior/Discharge Planning: Ability to manage health-related needs will improve 07/13/2017 1806 - Progressing by [name], RN Clinical Measurements: Ability to maintain clinical measurements within normal limits will improve 07/13/2017 1806 - Progressing by [name], RN Will remain free from infection 07/13/2017 1806 - Progressing by [name], RN Diagnostic test results will improve 07/13/2017 1806 - Progressing by [name], RN

## 2017-07-13 NOTE — ED Notes (Signed)
Pt resting in bed with wife at bedside watching TV, denies any needs at this time. Will continue to monitor for further patient needs.

## 2017-07-13 NOTE — H&P (Signed)
[facility] [place] NAME: Jason Gross    MR#:  [id number]  DATE OF BIRTH:  1956-05-27  DATE OF ADMISSION:  07/13/2017  PRIMARY CARE PHYSICIAN: Venia Carbon, MD   REQUESTING/REFERRING PHYSICIAN:   CHIEF COMPLAINT:   Chief Complaint  Patient presents with  . Weakness    HISTORY OF PRESENT ILLNESS: Jason Gross  is a 61 y.o. 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  PAST MEDICAL HISTORY:   Past Medical History:  Diagnosis Date  . Allergy   . Benign mass of parotid gland   . GERD (gastroesophageal reflux disease)   . Hyperlipidemia    not on meds  . Hypertension   . Vertigo     PAST SURGICAL HISTORY:  Past Surgical History:  Procedure Laterality Date  . LEFT HEART CATH AND CORONARY ANGIOGRAPHY N/A 06/30/2017   Procedure: LEFT HEART CATH AND CORONARY ANGIOGRAPHY;  Surgeon: Nelva Bush, MD;  Location: [facility];  Service: Cardiovascular;  Laterality: N/A;  . PAROTIDECTOMY Left 01/07/2017   Procedure: PAROTIDECTOMY (SUPERFICIAL);  Surgeon: Clyde Canterbury, MD;  Location: [facility];  Service: ENT;   Laterality: Left;    SOCIAL HISTORY:  Social History   Tobacco Use  . Smoking status: Former Smoker    Last attempt to quit: 07/28/1968    Years since quitting: 48.9  . Smokeless tobacco: Never Used  Substance Use Topics  . Alcohol use: No    Comment: occasional    FAMILY HISTORY:  Family History  Problem Relation Age of Onset  . Healthy Mother   . Coronary artery disease Father   . Diabetes Sister   . Diabetes Maternal Uncle   . Coronary artery disease Maternal Grandfather   . Diabetes Maternal Grandfather   . Esophageal cancer Paternal Uncle   . Hypertension Neg Hx   . Cancer Neg Hx   . Colon cancer Neg Hx   . Colon polyps Neg Hx   . Rectal cancer Neg Hx   . Stomach cancer Neg Hx     DRUG ALLERGIES: No Known Allergies  REVIEW OF SYSTEMS:   CONSTITUTIONAL: No fever, fatigue or weakness.  EYES: No blurred or double vision.  EARS, NOSE, AND THROAT: No tinnitus or ear pain.  RESPIRATORY: No cough, shortness of breath, wheezing or hemoptysis.  CARDIOVASCULAR: No chest pain, orthopnea, edema.  GASTROINTESTINAL:+ nausea,no vomiting, diarrhea or abdominal pain.  GENITOURINARY: No dysuria, hematuria.  Urinary incontinence with event ENDOCRINE: No polyuria, nocturia,  HEMATOLOGY: No anemia, easy bruising or bleeding SKIN: No rash or lesion. MUSCULOSKELETAL: No joint pain or arthritis.   NEUROLOGIC: No tingling, numbness, weakness.  Retrograde amnesia PSYCHIATRY: No anxiety or depression.   MEDICATIONS AT HOME:  Prior to  Admission medications   Medication Sig Start Date End Date Taking? Authorizing Provider  aspirin EC 81 MG EC tablet Take 1 tablet (81 mg total) by mouth daily. 07/07/17  Yes Max Sane, MD  atorvastatin (LIPITOR) 40 MG tablet Take 1 tablet (40 mg total) by mouth daily at 6 PM. 07/07/17  Yes Max Sane, MD  cetirizine (ZYRTEC) 10 MG tablet Take 10 mg by mouth daily.     Yes [provider]  clopidogrel (PLAVIX) 75 MG tablet Take 1 tablet (75  mg total) by mouth daily. 07/07/17  Yes Max Sane, MD  guaiFENesin-codeine 100-10 MG/5ML syrup Take 10 mLs by mouth every 4 (four) hours as needed for cough. 07/07/17  Yes Max Sane, MD  HYDROcodone-acetaminophen (NORCO) 5-325 MG tablet Take 1 tablet by mouth every 12 (twelve) hours as needed for severe pain. 07/07/17  Yes Max Sane, MD  ibuprofen (ADVIL,MOTRIN) 400 MG tablet Take 1 tablet (400 mg total) by mouth every 6 (six) hours as needed. 07/07/17  Yes Max Sane, MD  lisinopril-hydrochlorothiazide (PRINZIDE,ZESTORETIC) 10-12.5 MG tablet TAKE 1 TABLET DAILY 12/12/16  Yes Venia Carbon, MD  metoprolol tartrate (LOPRESSOR) 50 MG tablet Take 1 tablet (50 mg total) by mouth 2 (two) times daily. 07/07/17  Yes Max Sane, MD      PHYSICAL EXAMINATION:   VITAL SIGNS: Blood pressure 116/68, pulse 61, temperature 98.1 F (36.7 C), temperature source Oral, resp. rate 17, height 5\' 10"  (1.778 m), weight 95.3 kg (210 lb), SpO2 100 %.  GENERAL:  61 y.o.-year-old patient lying in the bed with no acute distress.  Overweight, nontoxic-appearing EYES: Pupils equal, round, reactive to light and accommodation. No scleral icterus. Extraocular muscles intact.  HEENT: Head atraumatic, normocephalic. Oropharynx and nasopharynx clear.  NECK:  Supple, no jugular venous distention. No thyroid enlargement, no tenderness.  LUNGS: Normal breath sounds bilaterally, no wheezing, rales,rhonchi or crepitation. No use of accessory muscles of respiration.  CARDIOVASCULAR: S1, S2 normal. No murmurs, rubs, or gallops.  ABDOMEN: Soft, nontender, nondistended. Bowel sounds present. No organomegaly or mass.  EXTREMITIES: No pedal edema, cyanosis, or clubbing.  NEUROLOGIC: Cranial nerves II through XII are intact. MAES. Gait not checked.  PSYCHIATRIC: The patient is alert and oriented x 3.  SKIN: No obvious rash, lesion, or ulcer.   LABORATORY PANEL:   CBC Recent Labs  Lab 07/13/17 0956  WBC 9.6  HGB 13.3   HCT 37.9*  PLT 403  MCV 85.8  MCH 30.1  MCHC 35.0  RDW 14.0   ------------------------------------------------------------------------------------------------------------------  Chemistries  Recent Labs  Lab 07/06/17 1500 07/07/17 0518 07/13/17 0956  NA  --   --  136  K 3.8 3.2* 4.0  CL  --   --  103  CO2  --   --  26  GLUCOSE  --   --  115*  BUN  --   --  30*  CREATININE  --   --  1.15  CALCIUM  --   --  8.6*  MG  --  1.9  --   AST  --   --  21  ALT  --   --  42  ALKPHOS  --   --  111  BILITOT  --   --  0.7   ------------------------------------------------------------------------------------------------------------------ estimated creatinine clearance is 79.1 mL/min (by C-G formula based on SCr of 1.15 mg/dL). ------------------------------------------------------------------------------------------------------------------ No results for input(s): TSH, T4TOTAL, T3FREE, THYROIDAB in the last 72 hours.  Invalid input(s): FREET3   Coagulation profile No  results for input(s): INR, PROTIME in the last 168 hours. ------------------------------------------------------------------------------------------------------------------- No results for input(s): DDIMER in the last 72 hours. -------------------------------------------------------------------------------------------------------------------  Cardiac Enzymes Recent Labs  Lab 07/13/17 0956  TROPONINI <0.03   ------------------------------------------------------------------------------------------------------------------ Invalid input(s): POCBNP  ---------------------------------------------------------------------------------------------------------------  Urinalysis    Component Value Date/Time   COLORURINE YELLOW (A) 07/13/2017 1153   APPEARANCEUR CLEAR (A) 07/13/2017 1153   LABSPEC 1.011 07/13/2017 1153   PHURINE 5.0 07/13/2017 1153   GLUCOSEU NEGATIVE 07/13/2017 1153   HGBUR NEGATIVE 07/13/2017 1153    HGBUR trace-intact 05/29/2008 1351   BILIRUBINUR NEGATIVE 07/13/2017 1153   KETONESUR NEGATIVE 07/13/2017 1153   PROTEINUR NEGATIVE 07/13/2017 1153   UROBILINOGEN 0.2 05/29/2008 1351   NITRITE NEGATIVE 07/13/2017 1153   LEUKOCYTESUR NEGATIVE 07/13/2017 1153     RADIOLOGY: Dg Chest 2 View  Result Date: 07/13/2017 CLINICAL DATA:  Syncope EXAM: CHEST  2 VIEW COMPARISON:  07/04/2017 FINDINGS: Borderline heart size, stable. Negative aortic and hilar contours. Calcified granuloma over the right upper lobe. There is no edema, consolidation, effusion, or pneumothorax. Elevated right diaphragm, also seen on priors. IMPRESSION: No acute finding. Electronically Signed   By: Monte Fantasia M.D.   On: 07/13/2017 10:39    EKG: Orders placed or performed during the hospital encounter of 07/13/17  . ED EKG  . ED EKG    IMPRESSION AND PLAN: 1 acute syncope with collapse Suspect related to orthostatic hypotension most likely related to medication recently started status post cardiac arrest/non-STEMI Referred to the observation unit, on telemetry, rule out acute coronary syndrome with cardiac enzymes x3 sets, check carotid Dopplers, neuro checks per routine, fall precautions, check orthostatics, IV fluids for rehydration, decrease Lopressor to 25 mg twice daily, hold lisinopril/hydrochlorothiazide, and continue close medical monitoring  2 recent cardiac arrest/non-STEMI Noted three-vessel disease on heart catheterization-recommended dual antiplatelet therapy for 12 months Decrease Lopressor stated above, hold lisinopril given orthostasis, continue DAPT, nitrates as needed, and statin therapy  3 chronic GERD without esophagitis PPI daily  4 chronic benign essential hypertension Noted orthostasis during the event Plan of care as stated above  Full code Condition stable Prognosis fair DVT prophylaxis with Lovenox subcu Disposition home tomorrow barring any complications   All the records  are reviewed and case discussed with ED provider. Management plans discussed with the patient, family and they are in agreement.  CODE STATUS: Code Status History    Date Active Date Inactive Code Status Order ID Comments User Context   06/30/2017 05:03 07/07/2017 13:39 Full Code [id number]  Mikael Spray, NP Inpatient   06/30/2017 04:18 06/30/2017 05:03 Full Code [id number]  Harrie Foreman, MD Inpatient   01/07/2017 11:30 01/07/2017 21:26 Full Code [id number]  Clyde Canterbury, MD Inpatient    Advance Directive Documentation     Most Recent Value  Type of Advance Directive  Living will, Healthcare Power of Attorney  Pre-existing out of facility DNR order (yellow form or pink MOST form)  No data  "MOST" Form in Place?  No data       TOTAL TIME TAKING CARE OF THIS PATIENT: 45 minutes.    Avel Peace Dlynn Ranes M.D on 07/13/2017   Between 7am to 6pm - Pager - [phone]  After 6pm go to [email or web address] - password [email or web address] [facility]  Office  [phone]  CC: Primary care physician; Venia Carbon, MD   Note: This dictation was prepared with Dragon dictation along with smaller phrase technology. Any transcriptional errors that result from this process  are unintentional.

## 2017-07-14 LAB — TROPONIN I

## 2017-07-14 LAB — HIV ANTIBODY (ROUTINE TESTING W REFLEX): HIV Screen 4th Generation wRfx: NONREACTIVE

## 2017-07-14 MED ORDER — METOPROLOL TARTRATE 25 MG PO TABS: 25.0000 mg | ORAL_TABLET | Freq: Two times a day (BID) | ORAL | 0 refills | Status: DC

## 2017-07-14 MED ORDER — LISINOPRIL 5 MG PO TABS: 2.5000 mg | ORAL_TABLET | Freq: Every day | ORAL | 0 refills | Status: DC

## 2017-07-14 NOTE — Discharge Summary (Signed)
[facility] NAME: Jason Gross    MR#:  [id number]  DATE OF BIRTH:  08-20-1955  DATE OF ADMISSION:  07/13/2017 ADMITTING PHYSICIAN: Gorden Harms, MD  DATE OF DISCHARGE: 12/ 18/18  PRIMARY CARE PHYSICIAN: Venia Carbon, MD    ADMISSION DIAGNOSIS:  Syncope and collapse [R55] Syncope, unspecified syncope type [R55]  DISCHARGE DIAGNOSIS:  Active Problems:   Syncope and collapse   SECONDARY DIAGNOSIS:   Past Medical History:  Diagnosis Date  . Allergy   . Benign mass of parotid gland   . GERD (gastroesophageal reflux disease)   . Hyperlipidemia    not on meds  . Hypertension   . Vertigo     HOSPITAL COURSE:   HISTORY OF PRESENT ILLNESS: Jason Gross  is a 61 y.o. 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  1 acute syncope with collapse  Sec  to orthostatic hypotension most likely related to medication recently started status post cardiac arrest/non-STEMI Orthostatic blood pressure is positive  Patient's symptoms improved with IV fluids denies any dizziness wants to go home  Monitored on  telemetry no abnormal rhythm notified  ruled out acute coronary syndrome with  negcardiac enzymes x3 sets Carotid Dopplers with no significant stenosis  neuro checks per routine  Continue Lopressor to 25 mg twice daily, d/c  lisinopril/hydrochlorothiazide until seen by primary care physician on Thursday, 07/16/2017 Patient is started on very small dose lisinopril 2.5 mg by mouth once daily  2  recent cardiac arrest/non-STEMI Noted three-vessel disease on heart catheterization-recommended dual antiplatelet therapy for 12 months Decrease Lopressor stated above, continue DAPT, nitrates as needed, and statin therapy Outpatient follow-up with cardiology in 5-7 days  3 chronic GERD without esophagitis PPI daily  4 chronic benign essential hypertension Noted orthostasis during the event Plan of care as stated above  Full code     DISCHARGE CONDITIONS:   stable  CONSULTS OBTAINED:     PROCEDURES  None   DRUG ALLERGIES:  No Known Allergies  DISCHARGE MEDICATIONS:   Allergies as of 07/14/2017   No Known Allergies     Medication List    STOP taking these medications   lisinopril-hydrochlorothiazide 10-12.5 MG tablet Commonly known as:  PRINZIDE,ZESTORETIC     TAKE these medications   aspirin 81 MG EC tablet Take 1 tablet (81 mg total) by mouth daily.   atorvastatin 40 MG tablet Commonly known as:  LIPITOR Take 1 tablet (40 mg total) by mouth daily at 6 PM.   cetirizine 10 MG tablet Commonly known as:  ZYRTEC Take 10 mg by mouth daily.   clopidogrel 75 MG tablet Commonly known as:  PLAVIX Take 1 tablet (75 mg total) by mouth daily.  guaiFENesin-codeine 100-10 MG/5ML syrup Take 10 mLs by mouth every 4 (four) hours as needed for cough.   HYDROcodone-acetaminophen 5-325 MG tablet Commonly known as:  NORCO Take 1 tablet by mouth every 12 (twelve) hours as needed for severe pain.   ibuprofen 400 MG tablet Commonly known as:  ADVIL,MOTRIN Take 1 tablet  (400 mg total) by mouth every 6 (six) hours as needed.   lisinopril 5 MG tablet Commonly known as:  PRINIVIL,ZESTRIL Take 0.5 tablets (2.5 mg total) by mouth daily.   metoprolol tartrate 25 MG tablet Commonly known as:  LOPRESSOR Take 1 tablet (25 mg total) by mouth 2 (two) times daily. What changed:    medication strength  how much to take        DISCHARGE INSTRUCTIONS:   Follow-up with the primary care physician in 2 days on Thursday, 07/16/2017 Follow-up with cardiology [facility] in 3-5 days  DIET:  Cardiac diet  DISCHARGE CONDITION:  Stable  ACTIVITY:  Activity as tolerated  OXYGEN:  Home Oxygen: No.   Oxygen Delivery: room air  DISCHARGE LOCATION:  home   If you experience worsening of your admission symptoms, develop shortness of breath, life threatening emergency, suicidal or homicidal thoughts you must seek medical attention immediately by calling 911 or calling your MD immediately  if symptoms less severe.  You Must read complete instructions/literature along with all the possible adverse reactions/side effects for all the Medicines you take and that have been prescribed to you. Take any new Medicines after you have completely understood and accpet all the possible adverse reactions/side effects.   Please note  You were cared for by a hospitalist during your hospital stay. If you have any questions about your discharge medications or the care you received while you were in the hospital after you are discharged, you can call the unit and asked to speak with the hospitalist on call if the hospitalist that took care of you is not available. Once you are discharged, your primary care physician will handle any further medical issues. Please note that NO REFILLS for any discharge medications will be authorized once you are discharged, as it is imperative that you return to your primary care physician (or establish a relationship with a primary care  physician if you do not have one) for your aftercare needs so that they can reassess your need for medications and monitor your lab values.     Today  Chief Complaint  Patient presents with  . Weakness   Pt is doing fine denies any dizziness or shortness of breath. Wants to go home desperately  ROS:  CONSTITUTIONAL: Denies fevers, chills. Denies any fatigue, weakness.  EYES: Denies blurry vision, double vision, eye pain. EARS, NOSE, THROAT: Denies tinnitus, ear pain, hearing loss. RESPIRATORY: Denies cough, wheeze, shortness of breath.  CARDIOVASCULAR: Denies chest pain, palpitations, edema.  GASTROINTESTINAL: Denies nausea, vomiting, diarrhea, abdominal pain. Denies bright red blood per rectum. GENITOURINARY: Denies dysuria, hematuria. ENDOCRINE: Denies nocturia or thyroid problems. HEMATOLOGIC AND LYMPHATIC: Denies easy bruising or bleeding. SKIN: Denies rash or lesion. MUSCULOSKELETAL: Denies pain in neck, back, shoulder, knees, hips or arthritic symptoms.  NEUROLOGIC: Denies paralysis, paresthesias.  PSYCHIATRIC: Denies anxiety or depressive symptoms.   VITAL SIGNS:  Blood pressure (!) 117/55, pulse 68, temperature 98 F (36.7 C), temperature source Oral, resp. rate 18, height 5\' 11"  (1.803 m), weight 90.5 kg (199 lb 8 oz), SpO2 98 %.  I/O:    Intake/Output Summary (Last 24 hours) at  07/14/2017 1451 Last data filed at 07/14/2017 1434 Gross per 24 hour  Intake 768.33 ml  Output 1000 ml  Net -231.67 ml    PHYSICAL EXAMINATION:  GENERAL:  61 y.o.-year-old patient lying in the bed with no acute distress.  EYES: Pupils equal, round, reactive to light and accommodation. No scleral icterus. Extraocular muscles intact.  HEENT: Head atraumatic, normocephalic. Oropharynx and nasopharynx clear.  NECK:  Supple, no jugular venous distention. No thyroid enlargement, no tenderness.  LUNGS: Normal breath sounds bilaterally, no wheezing, rales,rhonchi or crepitation. No use of  accessory muscles of respiration.  CARDIOVASCULAR: S1, S2 normal. No murmurs, rubs, or gallops.  ABDOMEN: Soft, non-tender, non-distended. Bowel sounds present. No organomegaly or mass.  EXTREMITIES: No pedal edema, cyanosis, or clubbing.  NEUROLOGIC: Cranial nerves II through XII are intact. Muscle strength 5/5 in all extremities. Sensation intact. Gait not checked.  PSYCHIATRIC: The patient is alert and oriented x 3.  SKIN: No obvious rash, lesion, or ulcer.   DATA REVIEW:   CBC Recent Labs  Lab 07/13/17 0956  WBC 9.6  HGB 13.3  HCT 37.9*  PLT 403    Chemistries  Recent Labs  Lab 07/13/17 0956  NA 136  K 4.0  CL 103  CO2 26  GLUCOSE 115*  BUN 30*  CREATININE 1.15  CALCIUM 8.6*  AST 21  ALT 42  ALKPHOS 111  BILITOT 0.7    Cardiac Enzymes Recent Labs  Lab 07/14/17 0335  TROPONINI <0.03    Microbiology Results  Results for orders placed or performed during the hospital encounter of 06/30/17  MRSA PCR Screening     Status: None   Collection Time: 06/30/17  4:55 AM  Result Value Ref Range Status   MRSA by PCR NEGATIVE NEGATIVE Final    Comment:        The GeneXpert MRSA Assay (FDA approved for NASAL specimens only), is one component of a comprehensive MRSA colonization surveillance program. It is not intended to diagnose MRSA infection nor to guide or monitor treatment for MRSA infections.   Culture, blood (Routine X 2) w Reflex to ID Panel     Status: None   Collection Time: 06/30/17  7:50 AM  Result Value Ref Range Status   Specimen Description BLOOD LEFT ANTECUBITAL  Final   Special Requests   Final    BOTTLES DRAWN AEROBIC AND ANAEROBIC Blood Culture adequate volume   Culture NO GROWTH 5 DAYS  Final   Report Status 07/05/2017 FINAL  Final  Culture, blood (Routine X 2) w Reflex to ID Panel     Status: None   Collection Time: 06/30/17  7:50 AM  Result Value Ref Range Status   Specimen Description BLOOD BLOOD RIGHT HAND  Final   Special Requests    Final    BOTTLES DRAWN AEROBIC AND ANAEROBIC Blood Culture adequate volume   Culture NO GROWTH 5 DAYS  Final   Report Status 07/05/2017 FINAL  Final  Culture, respiratory (NON-Expectorated)     Status: None   Collection Time: 06/30/17  8:51 AM  Result Value Ref Range Status   Specimen Description TRACHEAL ASPIRATE  Final   Special Requests NONE  Final   Gram Stain   Final    ABUNDANT WBC PRESENT,BOTH PMN AND MONONUCLEAR ABUNDANT GRAM POSITIVE COCCI IN PAIRS IN CHAINS ABUNDANT GRAM POSITIVE RODS    Culture   Final    Consistent with normal respiratory flora. Performed at [facility], [place],  [place] [address]    Report Status 07/02/2017 FINAL  Final  Urine Culture     Status: None   Collection Time: 06/30/17  3:40 PM  Result Value Ref Range Status   Specimen Description URINE, RANDOM  Final   Special Requests NONE  Final   Culture   Final    NO GROWTH Performed at [facility], [place] [address] [place], [place] [address]    Report Status 07/02/2017 FINAL  Final  Culture, respiratory (NON-Expectorated)     Status: None   Collection Time: 07/01/17 11:29 AM  Result Value Ref Range Status   Specimen Description TRACHEAL ASPIRATE  Final   Special Requests Normal  Final   Gram Stain   Final    RARE WBC PRESENT, PREDOMINANTLY PMN RARE GRAM POSITIVE COCCI IN PAIRS RARE GRAM POSITIVE RODS    Culture   Final    Consistent with normal respiratory flora. Performed at [facility], [place] [address], [place], [place] [address]    Report Status 07/03/2017 FINAL  Final  Urine Culture     Status: None   Collection Time: 07/04/17  6:31 AM  Result Value Ref Range Status   Specimen Description URINE, RANDOM  Final   Special Requests NONE  Final   Culture   Final    NO GROWTH Performed at [facility], [place] [address] [place], [place] [address]    Report Status 07/05/2017 FINAL  Final  Culture, blood (Routine X 2) w Reflex to ID Panel      Status: None   Collection Time: 07/04/17  6:40 AM  Result Value Ref Range Status   Specimen Description BLOOD BLOOD LEFT HAND  Final   Special Requests   Final    BOTTLES DRAWN AEROBIC AND ANAEROBIC Blood Culture adequate volume   Culture NO GROWTH 5 DAYS  Final   Report Status 07/09/2017 FINAL  Final  Culture, blood (Routine X 2) w Reflex to ID Panel     Status: None   Collection Time: 07/04/17  6:46 AM  Result Value Ref Range Status   Specimen Description BLOOD LEFT ANTECUBITAL  Final   Special Requests   Final    BOTTLES DRAWN AEROBIC AND ANAEROBIC Blood Culture adequate volume   Culture NO GROWTH 5 DAYS  Final   Report Status 07/09/2017 FINAL  Final    RADIOLOGY:  Dg Chest 2 View  Result Date: 07/13/2017 CLINICAL DATA:  Syncope EXAM: CHEST  2 VIEW COMPARISON:  07/04/2017 FINDINGS: Borderline heart size, stable. Negative aortic and hilar contours. Calcified granuloma over the right upper lobe. There is no edema, consolidation, effusion, or pneumothorax. Elevated right diaphragm, also seen on priors. IMPRESSION: No acute finding. Electronically Signed   By: Monte Fantasia M.D.   On: 07/13/2017 10:39   US Carotid Bilateral  Result Date: 07/14/2017 CLINICAL DATA:  Syncope and collapse. History of myocardial infarction, hypertension and hyperlipidemia. EXAM: BILATERAL CAROTID DUPLEX ULTRASOUND TECHNIQUE: Pearline Cables scale imaging, color Doppler and duplex ultrasound were performed of bilateral carotid and vertebral arteries in the neck. COMPARISON:  None. FINDINGS: Criteria: Quantification of carotid stenosis is based on velocity parameters that correlate the residual internal carotid diameter with NASCET-based stenosis levels, using the diameter of the distal internal carotid lumen as the denominator for stenosis measurement. The following velocity measurements were obtained: RIGHT ICA:  69/15 cm/sec CCA:  43/15 cm/sec SYSTOLIC ICA/CCA RATIO:  0.9 DIASTOLIC ICA/CCA RATIO:  1.4 ECA:  131 cm/sec  LEFT  ICA:  83/21 cm/sec CCA:  889/16 cm/sec SYSTOLIC ICA/CCA RATIO:  0.8 DIASTOLIC ICA/CCA RATIO:  1.7 ECA:  89 cm/sec RIGHT CAROTID ARTERY: There is a minimal amount of atherosclerotic plaque within the right carotid bulb (image 18), not resulting in elevated peak systolic velocities within the interrogated course the right internal carotid artery to suggest a hemodynamically significant stenosis. RIGHT VERTEBRAL ARTERY:  Antegrade flow LEFT CAROTID ARTERY: There is a minimal amount of atherosclerotic plaque within the left carotid bulb (image 49), not resulting in elevated peak systolic velocities within the interrogated course of the left internal carotid artery to suggest a hemodynamically significant stenosis. LEFT VERTEBRAL ARTERY:  Antegrade flow IMPRESSION: Minimal amount of bilateral atherosclerotic plaque, not resulting in a hemodynamically significant stenosis within either internal carotid artery. Electronically Signed   By: Sandi Mariscal M.D.   On: 07/14/2017 08:59    EKG:   Orders placed or performed during the hospital encounter of 07/13/17  . ED EKG  . ED EKG      Management plans discussed with the patient, family and they are in agreement.  CODE STATUS:     Code Status Orders  (From admission, onward)        Start     Ordered   07/13/17 1548  Full code  Continuous     07/13/17 1547    Code Status History    Date Active Date Inactive Code Status Order ID Comments User Context   06/30/2017 05:03 07/07/2017 13:39 Full Code [id number]  Mikael Spray, NP Inpatient   06/30/2017 04:18 06/30/2017 05:03 Full Code [id number]  Harrie Foreman, MD Inpatient   01/07/2017 11:30 01/07/2017 21:26 Full Code [id number]  Clyde Canterbury, MD Inpatient    Advance Directive Documentation     Most Recent Value  Type of Advance Directive  Living will, Healthcare Power of Attorney  Pre-existing out of facility DNR order (yellow form or pink MOST form)  No data  "MOST" Form in Place?  No  data      TOTAL TIME TAKING CARE OF THIS PATIENT: 45 minutes.   Note: This dictation was prepared with Dragon dictation along with smaller phrase technology. Any transcriptional errors that result from this process are unintentional.   @[facility] @  on 07/14/2017 at 2:51 PM  Between 7am to 6pm - Pager - [phone]  After 6pm go to [email or web address] - password [email or web address] [facility]  Office  [phone]  CC: Primary care physician; Venia Carbon, MD

## 2017-07-14 NOTE — Discharge Instructions (Signed)
Follow-up with the primary care physician in 2 days on Thursday, 07/16/2017 Follow-up with cardiology [facility] in 3-5 days

## 2017-07-14 NOTE — Progress Notes (Addendum)
Jason Gross to be D/C'd Home per MD order.  Discussed prescriptions and follow up appointments with the patient and wife. Prescriptions given to patient, medication list explained in detail. Pt verbalized understanding.  Allergies as of 07/14/2017   No Known Allergies     Medication List    STOP taking these medications   lisinopril-hydrochlorothiazide 10-12.5 MG tablet Commonly known as:  PRINZIDE,ZESTORETIC     TAKE these medications   aspirin 81 MG EC tablet Take 1 tablet (81 mg total) by mouth daily.   atorvastatin 40 MG tablet Commonly known as:  LIPITOR Take 1 tablet (40 mg total) by mouth daily at 6 PM.   cetirizine 10 MG tablet Commonly known as:  ZYRTEC Take 10 mg by mouth daily.   clopidogrel 75 MG tablet Commonly known as:  PLAVIX Take 1 tablet (75 mg total) by mouth daily.   guaiFENesin-codeine 100-10 MG/5ML syrup Take 10 mLs by mouth every 4 (four) hours as needed for cough.   HYDROcodone-acetaminophen 5-325 MG tablet Commonly known as:  NORCO Take 1 tablet by mouth every 12 (twelve) hours as needed for severe pain.   ibuprofen 400 MG tablet Commonly known as:  ADVIL,MOTRIN Take 1 tablet (400 mg total) by mouth every 6 (six) hours as needed.   lisinopril 5 MG tablet Commonly known as:  PRINIVIL,ZESTRIL Take 0.5 tablets (2.5 mg total) by mouth daily.   metoprolol tartrate 25 MG tablet Commonly known as:  LOPRESSOR Take 1 tablet (25 mg total) by mouth 2 (two) times daily. What changed:    medication strength  how much to take       Vitals:   07/14/17 0355 07/14/17 0742  BP: (!) 115/94 (!) 117/55  Pulse: 73 68  Resp:  18  Temp:    SpO2:  98%    Tele box removed and returned.Skin clean, dry and intact without evidence of skin break down, no evidence of skin tears noted. IV catheter discontinued intact. Site without signs and symptoms of complications. Dressing and pressure applied. Pt denies pain at this time. No complaints noted.  An  After Visit Summary was printed and given to the patient. Patient escorted via [place], and D/C home via private auto.  Jason Gross

## 2017-07-16 ENCOUNTER — Ambulatory Visit: Payer: 59 | Admitting: Internal Medicine

## 2017-07-16 ENCOUNTER — Encounter: Payer: Self-pay | Admitting: Internal Medicine

## 2017-07-16 VITALS — BP 118/76 | HR 63 | Temp 98.0°F | Wt 207.0 lb

## 2017-07-16 DIAGNOSIS — R0781 Pleurodynia: Secondary | ICD-10-CM | POA: Insufficient documentation

## 2017-07-16 DIAGNOSIS — I2510 Atherosclerotic heart disease of native coronary artery without angina pectoris: Secondary | ICD-10-CM | POA: Diagnosis not present

## 2017-07-16 NOTE — Assessment & Plan Note (Signed)
From CPR Asked him to limit the NSAIDs and wean off as soon as possible He can try velcro brace to see if it helps

## 2017-07-16 NOTE — Progress Notes (Signed)
Subjective:    Patient ID: Jason Gross, male    DOB: 05/29/1956, 61 y.o.   MRN: [id number]  HPI Here with wife for follow up after cardiac arrest, MI and then rehospitalization for syncope  Reviewed hospital course--for both visits Recently, metoprolol and ACEI cut in half after the syncope Has appt with Dr [name] tomorrow  Was having chest pain that he thought was indigestion Just "didn't feel good" Then "bad heartburn" --then got up and passed out within minutes  No chest pain--other than from his ribs Some cough and throat irritation from ET tube Breathing is okay Has information about cardiac rehab  Current Outpatient Medications on File Prior to Visit  Medication Sig Dispense Refill  . aspirin EC 81 MG EC tablet Take 1 tablet (81 mg total) by mouth daily. 30 tablet 0  . atorvastatin (LIPITOR) 40 MG tablet Take 1 tablet (40 mg total) by mouth daily at 6 PM. 30 tablet 0  . cetirizine (ZYRTEC) 10 MG tablet Take 10 mg by mouth daily.      . clopidogrel (PLAVIX) 75 MG tablet Take 1 tablet (75 mg total) by mouth daily. 30 tablet 0  . guaiFENesin-codeine 100-10 MG/5ML syrup Take 10 mLs by mouth every 4 (four) hours as needed for cough. 120 mL 0  . HYDROcodone-acetaminophen (NORCO) 5-325 MG tablet Take 1 tablet by mouth every 12 (twelve) hours as needed for severe pain. 10 tablet 0  . ibuprofen (ADVIL,MOTRIN) 400 MG tablet Take 1 tablet (400 mg total) by mouth every 6 (six) hours as needed. 30 tablet 0  . lisinopril (PRINIVIL,ZESTRIL) 5 MG tablet Take 0.5 tablets (2.5 mg total) by mouth daily. 30 tablet 0  . metoprolol tartrate (LOPRESSOR) 25 MG tablet Take 1 tablet (25 mg total) by mouth 2 (two) times daily. 60 tablet 0   No current facility-administered medications on file prior to visit.     No Known Allergies  Past Medical History:  Diagnosis Date  . Allergy   . Benign mass of parotid gland   . GERD (gastroesophageal reflux disease)   . Hyperlipidemia    not on meds    . Hypertension   . Vertigo     Past Surgical History:  Procedure Laterality Date  . LEFT HEART CATH AND CORONARY ANGIOGRAPHY N/A 06/30/2017   Procedure: LEFT HEART CATH AND CORONARY ANGIOGRAPHY;  Surgeon: Nelva Bush, MD;  Location: [facility];  Service: Cardiovascular;  Laterality: N/A;  . PAROTIDECTOMY Left 01/07/2017   Procedure: PAROTIDECTOMY (SUPERFICIAL);  Surgeon: Clyde Canterbury, MD;  Location: [facility];  Service: ENT;  Laterality: Left;    Family History  Problem Relation Age of Onset  . Healthy Mother   . Coronary artery disease Father   . Diabetes Sister   . Diabetes Maternal Uncle   . Coronary artery disease Maternal Grandfather   . Diabetes Maternal Grandfather   . Esophageal cancer Paternal Uncle   . Hypertension Neg Hx   . Cancer Neg Hx   . Colon cancer Neg Hx   . Colon polyps Neg Hx   . Rectal cancer Neg Hx   . Stomach cancer Neg Hx     Social History   Socioeconomic History  . Marital status: Married    Spouse name: Not on file  . Number of children: 1  . Years of education: Not on file  . Highest education level: Not on file  Social Needs  . Financial resource strain: Not on file  .  Food insecurity - worry: Not on file  . Food insecurity - inability: Not on file  . Transportation needs - medical: Not on file  . Transportation needs - non-medical: Not on file  Occupational History  . Occupation: Furniture conservator/restorer @ [employer] Use  . Smoking status: Former Smoker    Last attempt to quit: 07/28/1968    Years since quitting: 49.0  . Smokeless tobacco: Never Used  Substance and Sexual Activity  . Alcohol use: No    Comment: occasional  . Drug use: No  . Sexual activity: Yes  Other Topics Concern  . Not on file  Social History Narrative  . Not on file   Review of Systems  Appetite is okay Now trying to be careful with what he is eating Hard stools--discussed miralax Has to sleep in the recliner still due to rib pain      Objective:   Physical Exam  Constitutional: No distress.  Cardiovascular: Normal rate, regular rhythm, normal heart sounds and intact distal pulses. Exam reveals no gallop.  No murmur heard. Pulmonary/Chest: Effort normal and breath sounds normal. No respiratory distress. He has no wheezes. He has no rales.  Abdominal: Soft. He exhibits no distension. There is no tenderness.  Musculoskeletal: He exhibits no edema.  Psychiatric: He has a normal mood and affect. His behavior is normal.          Assessment & Plan:

## 2017-07-16 NOTE — Assessment & Plan Note (Signed)
Had cardiac arrest but fortunately normal EF still and was resuscitated On appropriate regimen though needed beta blocker and ACEI decreased due to syncope from orthostatic BP Has follow up with cardiologist tomorrow--will discuss return to work date Recommended pursuing cardiac rehab

## 2017-07-30 ENCOUNTER — Encounter: Payer: 59 | Attending: Internal Medicine | Admitting: *Deleted

## 2017-07-30 ENCOUNTER — Encounter: Payer: Self-pay | Admitting: *Deleted

## 2017-07-30 VITALS — Ht 71.0 in | Wt 208.2 lb

## 2017-07-30 DIAGNOSIS — I214 Non-ST elevation (NSTEMI) myocardial infarction: Secondary | ICD-10-CM | POA: Diagnosis present

## 2017-07-30 NOTE — Progress Notes (Signed)
Daily Session Note  Patient Details  Name: Jason Gross MRN: [id number] Date of Birth: [birth year] Referring Provider:     Cardiac Rehab from 07/30/2017 in [facility] Cardiac and Pulmonary Rehab  Referring Provider  [name]      Encounter Date: 07/30/2017  Check In: Session Check In - 07/30/17 1317      Check-In   Location  [facility]-Cardiac & Pulmonary Rehab    Staff Present  Renita Papa, RN Vickki Hearing, BA, ACSM CEP, Exercise Physiologist    Supervising physician immediately available to respond to emergencies  See telemetry face sheet for immediately available ER MD    Medication changes reported      No    Fall or balance concerns reported     No    Tobacco Cessation  No Change quit in [place] and [place]  Performed as group-led instruction    Resistance Training Performed  Yes    VAD Patient?  No      Pain Assessment   Currently in Pain?  No/denies        Exercise Prescription Changes - 07/30/17 1400      Response to Exercise   Blood Pressure (Admit)  124/68    Blood Pressure (Exercise)  152/70    Blood Pressure (Exit)  130/74    Heart Rate (Admit)  54 bpm    Heart Rate (Exercise)  103 bpm    Heart Rate (Exit)  103 bpm    Oxygen Saturation (Admit)  98 %    Rating of Perceived Exertion (Exercise)  12       Social History   Tobacco Use  Smoking Status Former Smoker  . Last attempt to quit: 07/28/1968  . Years since quitting: 49.0  Smokeless Tobacco Never Used    Goals Met:  Proper associated with RPD/PD & O2 Sat Exercise tolerated well Personal goals reviewed No report of cardiac concerns or symptoms Strength training completed today  Goals Unmet:  Not Applicable  Comments: Med review completed   Dr. Emily Filbert is Medical Director for [facility].

## 2017-07-30 NOTE — Progress Notes (Signed)
Cardiac Individual Treatment Plan  Patient Details  Name: Jason Gross MRN: [id number] Date of Birth: 1956/06/12 Referring Provider:     Cardiac Rehab from 07/30/2017 in [facility] Cardiac and Pulmonary Rehab  Referring Provider  [name]      Initial Encounter Date:    Cardiac Rehab from 07/30/2017 in [facility] Cardiac and Pulmonary Rehab  Date  07/30/17  Referring Provider  [name]      Visit Diagnosis: NSTEMI (non-ST elevated myocardial infarction) [facility])  Patient's Home Medications on Admission:  Current Outpatient Medications:  .  aspirin EC 81 MG EC tablet, Take 1 tablet (81 mg total) by mouth daily., Disp: 30 tablet, Rfl: 0 .  atorvastatin (LIPITOR) 40 MG tablet, Take 1 tablet (40 mg total) by mouth daily at 6 PM., Disp: 30 tablet, Rfl: 0 .  cetirizine (ZYRTEC) 10 MG tablet, Take 10 mg by mouth daily.  , Disp: , Rfl:  .  clopidogrel (PLAVIX) 75 MG tablet, Take 1 tablet (75 mg total) by mouth daily., Disp: 30 tablet, Rfl: 0 .  guaiFENesin-codeine 100-10 MG/5ML syrup, Take 10 mLs by mouth every 4 (four) hours as needed for cough., Disp: 120 mL, Rfl: 0 .  ibuprofen (ADVIL,MOTRIN) 400 MG tablet, Take 1 tablet (400 mg total) by mouth every 6 (six) hours as needed., Disp: 30 tablet, Rfl: 0 .  lisinopril (PRINIVIL,ZESTRIL) 5 MG tablet, Take 0.5 tablets (2.5 mg total) by mouth daily., Disp: 30 tablet, Rfl: 0 .  metoprolol tartrate (LOPRESSOR) 25 MG tablet, Take 1 tablet (25 mg total) by mouth 2 (two) times daily., Disp: 60 tablet, Rfl: 0 .  HYDROcodone-acetaminophen (NORCO) 5-325 MG tablet, Take 1 tablet by mouth every 12 (twelve) hours as needed for severe pain. (Patient not taking: Reported on 07/30/2017), Disp: 10 tablet, Rfl: 0  Past Medical History: Past Medical History:  Diagnosis Date  . Allergy   . Benign mass of parotid gland   . GERD (gastroesophageal reflux disease)   . Hyperlipidemia    not on meds  . Hypertension   . Vertigo     Tobacco Use: Social History   Tobacco Use    Smoking Status Former Smoker  . Last attempt to quit: 07/28/1968  . Years since quitting: 49.0  Smokeless Tobacco Never Used    Labs: Recent Chemical engineer    Labs for ITP Cardiac and Pulmonary Rehab Latest Ref Rng & Units 06/30/2017 06/30/2017 06/30/2017 06/30/2017 07/03/2017   Cholestrol 0 - 200 mg/dL - 153 - - -   LDLCALC 0 - 99 mg/dL - 91 - - -   HDL >40 mg/dL - 28(L) - - -   Trlycerides <150 mg/dL - 170(H) 168(H) - -   Hemoglobin A1c 4.8 - 5.6 % - 5.6 - - -   PHART 7.350 - 7.450 7.28(L) - - 7.25(L) 7.41   PCO2ART 32.0 - 48.0 mmHg 43 - - 54(H) 52(H)   HCO3 20.0 - 28.0 mmol/L 20.2 - - 23.1 33.0(H)   ACIDBASEDEF 0.0 - 2.0 mmol/L 6.4(H) - - 5.0(H) -   O2SAT % 96.0 - - 99.8 97.1       Exercise Target Goals: Date: 07/30/17  Exercise Program Goal: Individual exercise prescription set with THRR, safety & activity barriers. Participant demonstrates ability to understand and report RPE using BORG scale, to self-measure pulse accurately, and to acknowledge the importance of the exercise prescription.  Exercise Prescription Goal: Starting with aerobic activity 30 plus minutes a day, 3 days per week for initial exercise prescription.  Provide home exercise prescription and guidelines that participant acknowledges understanding prior to discharge.  Activity Barriers & Risk Stratification: Activity Barriers & Cardiac Risk Stratification - 07/30/17 1337      Activity Barriers & Cardiac Risk Stratification   Activity Barriers  Other (comment)    Comments  pain from 4 broken ribs related to CPR    Cardiac Risk Stratification  Moderate       6 Minute Walk: 6 Minute Walk    Row Name 07/30/17 1427         6 Minute Walk   Distance  1400 feet     Walk Time  6 minutes     # of Rest Breaks  0     MPH  2.65     METS  3.77     RPE  11     Perceived Dyspnea   2     VO2 Peak  13.2     Symptoms  Yes (comment)     Comments  vertigo ( makes sure he look sahead not at floor and is  fine)     Resting HR  53 bpm     Resting BP  124/68     Resting Oxygen Saturation   98 %     Max Ex. HR  103 bpm     Max Ex. BP  152/70     2 Minute Post BP  130/74        Oxygen Initial Assessment:   Oxygen Re-Evaluation:   Oxygen Discharge (Final Oxygen Re-Evaluation):   Initial Exercise Prescription: Initial Exercise Prescription - 07/30/17 1400      Date of Initial Exercise RX and Referring Provider   Date  07/30/17    Referring Provider  [name]      Treadmill   MPH  2.5    Grade  2    Minutes  15    METs  3.6      Recumbant Bike   Level  3    RPM  60    Watts  50    Minutes  15    METs  3.6      T5 Nustep   Level  3    SPM  80    Minutes  15    METs  3.6      Prescription Details   Frequency (times per week)  3    Duration  Progress to 45 minutes of aerobic exercise without signs/symptoms of physical distress      Intensity   THRR 40-80% of Max Heartrate  95-138    Ratings of Perceived Exertion  11-13    Perceived Dyspnea  0-4      Resistance Training   Training Prescription  Yes    Weight  4 lb    Reps  10-15       Perform Capillary Blood Glucose checks as needed.  Exercise Prescription Changes: Exercise Prescription Changes    Row Name 07/30/17 1400             Response to Exercise   Blood Pressure (Admit)  124/68       Blood Pressure (Exercise)  152/70       Blood Pressure (Exit)  130/74       Heart Rate (Admit)  54 bpm       Heart Rate (Exercise)  103 bpm       Heart Rate (Exit)  103 bpm       Oxygen  Saturation (Admit)  98 %       Rating of Perceived Exertion (Exercise)  12          Exercise Comments:   Exercise Goals and Review: Exercise Goals    Row Name 07/30/17 1427             Exercise Goals   Increase Physical Activity  Yes       Intervention  Provide advice, education, support and counseling about physical activity/exercise needs.;Develop an individualized exercise prescription for aerobic and resistive  training based on initial evaluation findings, risk stratification, comorbidities and participant's personal goals.       Expected Outcomes  Achievement of increased cardiorespiratory fitness and enhanced flexibility, muscular endurance and strength shown through measurements of functional capacity and personal statement of participant.       Increase Strength and Stamina  Yes       Intervention  Provide advice, education, support and counseling about physical activity/exercise needs.;Develop an individualized exercise prescription for aerobic and resistive training based on initial evaluation findings, risk stratification, comorbidities and participant's personal goals.       Expected Outcomes  Achievement of increased cardiorespiratory fitness and enhanced flexibility, muscular endurance and strength shown through measurements of functional capacity and personal statement of participant.       Able to understand and use rate of perceived exertion (RPE) scale  Yes       Intervention  Provide education and explanation on how to use RPE scale       Expected Outcomes  Short Term: Able to use RPE daily in rehab to express subjective intensity level;Long Term:  Able to use RPE to guide intensity level when exercising independently       Able to understand and use Dyspnea scale  Yes       Intervention  Provide education and explanation on how to use Dyspnea scale       Expected Outcomes  Short Term: Able to use Dyspnea scale daily in rehab to express subjective sense of shortness of breath during exertion;Long Term: Able to use Dyspnea scale to guide intensity level when exercising independently       Knowledge and understanding of Target Heart Rate Range (THRR)  Yes       Intervention  Provide education and explanation of THRR including how the numbers were predicted and where they are located for reference       Expected Outcomes  Short Term: Able to state/look up THRR;Long Term: Able to use THRR to govern  intensity when exercising independently;Short Term: Able to use daily as guideline for intensity in rehab       Able to check pulse independently  Yes       Intervention  Provide education and demonstration on how to check pulse in carotid and radial arteries.;Review the importance of being able to check your own pulse for safety during independent exercise       Expected Outcomes  Short Term: Able to explain why pulse checking is important during independent exercise;Long Term: Able to check pulse independently and accurately       Understanding of Exercise Prescription  Yes       Intervention  Provide education, explanation, and written materials on patient's individual exercise prescription       Expected Outcomes  Short Term: Able to explain program exercise prescription;Long Term: Able to explain home exercise prescription to exercise independently          Exercise Goals  Re-Evaluation :   Discharge Exercise Prescription (Final Exercise Prescription Changes): Exercise Prescription Changes - 07/30/17 1400      Response to Exercise   Blood Pressure (Admit)  124/68    Blood Pressure (Exercise)  152/70    Blood Pressure (Exit)  130/74    Heart Rate (Admit)  54 bpm    Heart Rate (Exercise)  103 bpm    Heart Rate (Exit)  103 bpm    Oxygen Saturation (Admit)  98 %    Rating of Perceived Exertion (Exercise)  12       Nutrition:  Target Goals: Understanding of nutrition guidelines, daily intake of sodium 1500mg , cholesterol 200mg , calories 30% from fat and 7% or less from saturated fats, daily to have 5 or more servings of fruits and vegetables.  Biometrics: Pre Biometrics - 07/30/17 1425      Pre Biometrics   Height  5\' 11"  (1.803 m)    Weight  208 lb 3.2 oz (94.4 kg)    Waist Circumference  44 inches    Hip Circumference  42.25 inches    Waist to Hip Ratio  1.04 %    BMI (Calculated)  29.05    Single Leg Stand  30 seconds        Nutrition Therapy Plan and Nutrition  Goals: Nutrition Therapy & Goals - 07/30/17 1329      Intervention Plan   Intervention  Prescribe, educate and counsel regarding individualized specific dietary modifications aiming towards targeted core components such as weight, hypertension, lipid management, diabetes, heart failure and other comorbidities.;Nutrition handout(s) given to patient.    Expected Outcomes  Short Term Goal: Understand basic principles of dietary content, such as calories, fat, sodium, cholesterol and nutrients.;Short Term Goal: A plan has been developed with personal nutrition goals set during dietitian appointment.;Long Term Goal: Adherence to prescribed nutrition plan.       Nutrition Discharge: Rate Your Plate Scores: Nutrition Assessments - 07/30/17 1330      MEDFICTS Scores   Pre Score  -- will bring Medficts to first day of class       Nutrition Goals Re-Evaluation:   Nutrition Goals Discharge (Final Nutrition Goals Re-Evaluation):   Psychosocial: Target Goals: Acknowledge presence or absence of significant depression and/or stress, maximize coping skills, provide positive support system. Participant is able to verbalize types and ability to use techniques and skills needed for reducing stress and depression.   Initial Review & Psychosocial Screening: Initial Psych Review & Screening - 07/30/17 1330      Initial Review   Current issues with  Current Sleep Concerns pain from broken ribs related to CPR, but getting better      [place]?  Yes Wife and family      Screening Interventions   Interventions  Yes;Encouraged to exercise;Program counselor consult    Expected Outcomes  Short Term goal: Utilizing psychosocial counselor, staff and physician to assist with identification of specific Stressors or current issues interfering with healing process. Setting desired goal for each stressor or current issue identified.;Short Term goal: Identification and review with  participant of any Quality of Life or Depression concerns found by scoring the questionnaire.;Long Term Goal: Stressors or current issues are controlled or eliminated.;Long Term goal: The participant improves quality of Life and PHQ9 Scores as seen by post scores and/or verbalization of changes       Quality of Life Scores:  Quality of Life - 07/30/17 1201  Quality of Life Scores   Health/Function Pre  29.14 %    Socioeconomic Pre  30 %    Psych/Spiritual Pre  30 %    Family Pre  30 %    GLOBAL Pre  29.64 %       PHQ-9: Recent Review Flowsheet Data    Depression screen [facility] 2/9 07/30/2017 03/12/2017   Decreased Interest 0 0   Down, Depressed, Hopeless 0 0   PHQ - 2 Score 0 0   Altered sleeping 1  -   Tired, decreased energy 0 -   Change in appetite 0 -   Feeling bad or failure about yourself  0 -   Trouble concentrating 0 -   Moving slowly or fidgety/restless 0 -   Suicidal thoughts 0 -   PHQ-9 Score 1 -   Difficult doing work/chores Not difficult at all -     Interpretation of Total Score  Total Score Depression Severity:  1-4 = Minimal depression, 5-9 = Mild depression, 10-14 = Moderate depression, 15-19 = Moderately severe depression, 20-27 = Severe depression   Psychosocial Evaluation and Intervention:   Psychosocial Re-Evaluation:   Psychosocial Discharge (Final Psychosocial Re-Evaluation):   Vocational Rehabilitation: Provide vocational rehab assistance to qualifying candidates.   Vocational Rehab Evaluation & Intervention: Vocational Rehab - 07/30/17 1336      Initial Vocational Rehab Evaluation & Intervention   Assessment shows need for Vocational Rehabilitation  No       Education: Education Goals: Education classes will be provided on a variety of topics geared toward better understanding of heart health and risk factor modification. Participant will state understanding/return demonstration of topics presented as noted by education test  scores.  Learning Barriers/Preferences: Learning Barriers/Preferences - 07/30/17 1334      Learning Barriers/Preferences   Learning Barriers  None    Learning Preferences  None       Education Topics: General Nutrition Guidelines/Fats and Fiber: -Group instruction provided by verbal, written material, models and posters to present the general guidelines for heart healthy nutrition. Gives an explanation and review of dietary fats and fiber.   Controlling Sodium/Reading Food Labels: -Group verbal and written material supporting the discussion of sodium use in heart healthy nutrition. Review and explanation with models, verbal and written materials for utilization of the food label.   Exercise Physiology & Risk Factors: - Group verbal and written instruction with models to review the exercise physiology of the cardiovascular system and associated critical values. Details cardiovascular disease risk factors and the goals associated with each risk factor.   Aerobic Exercise & Resistance Training: - Gives group verbal and written discussion on the health impact of inactivity. On the components of aerobic and resistive training programs and the benefits of this training and how to safely progress through these programs.   Flexibility, Balance, General Exercise Guidelines: - Provides group verbal and written instruction on the benefits of flexibility and balance training programs. Provides general exercise guidelines with specific guidelines to those with heart or lung disease. Demonstration and skill practice provided.   Stress Management: - Provides group verbal and written instruction about the health risks of elevated stress, cause of high stress, and healthy ways to reduce stress.   Depression: - Provides group verbal and written instruction on the correlation between heart/lung disease and depressed mood, treatment options, and the stigmas associated with seeking  treatment.   Anatomy & Physiology of the Heart: - Group verbal and written instruction and models provide basic  cardiac anatomy and physiology, with the coronary electrical and arterial systems. Review of: AMI, Angina, Valve disease, Heart Failure, Cardiac Arrhythmia, Pacemakers, and the ICD.   Cardiac Procedures: - Group verbal and written instruction to review commonly prescribed medications for heart disease. Reviews the medication, class of the drug, and side effects. Includes the steps to properly store meds and maintain the prescription regimen. (beta blockers and nitrates)   Cardiac Medications I: - Group verbal and written instruction to review commonly prescribed medications for heart disease. Reviews the medication, class of the drug, and side effects. Includes the steps to properly store meds and maintain the prescription regimen.   Cardiac Medications II: -Group verbal and written instruction to review commonly prescribed medications for heart disease. Reviews the medication, class of the drug, and side effects. (all other drug classes)    Go Sex-Intimacy & Heart Disease, Get SMART - Goal Setting: - Group verbal and written instruction through game format to discuss heart disease and the return to sexual intimacy. Provides group verbal and written material to discuss and apply goal setting through the application of the S.M.A.R.T. Method.   Other Matters of the Heart: - Provides group verbal, written materials and models to describe Heart Failure, Angina, Valve Disease, Peripheral Artery Disease, and Diabetes in the realm of heart disease. Includes description of the disease process and treatment options available to the cardiac patient.   Exercise & Equipment Safety: - Individual verbal instruction and demonstration of equipment use and safety with use of the equipment.   Cardiac Rehab from 07/30/2017 in [facility] Cardiac and Pulmonary Rehab  Date  07/30/17  Educator  [facility]   Instruction Review Code  1- Verbalizes Understanding      Infection Prevention: - Provides verbal and written material to individual with discussion of infection control including proper hand washing and proper equipment cleaning during exercise session.   Cardiac Rehab from 07/30/2017 in [facility] Cardiac and Pulmonary Rehab  Date  07/30/17  Educator  [facility]  Instruction Review Code  1- Verbalizes Understanding      Falls Prevention: - Provides verbal and written material to individual with discussion of falls prevention and safety.   Cardiac Rehab from 07/30/2017 in [facility] Cardiac and Pulmonary Rehab  Date  07/30/17  Educator  [facility]  Instruction Review Code  1- Verbalizes Understanding      Diabetes: - Individual verbal and written instruction to review signs/symptoms of diabetes, desired ranges of glucose level fasting, after meals and with exercise. Acknowledge that pre and post exercise glucose checks will be done for 3 sessions at entry of program.   Other: -Provides group and verbal instruction on various topics (see comments)    Knowledge Questionnaire Score: Knowledge Questionnaire Score - 07/30/17 1335      Knowledge Questionnaire Score   Pre Score  23/28 correct answers reviewed with Herbie Baltimore       Core Components/Risk Factors/Patient Goals at Admission: Personal Goals and Risk Factors at Admission - 07/30/17 1327      Core Components/Risk Factors/Patient Goals on Admission    Weight Management  Yes;Weight Loss    Intervention  Weight Management: Develop a combined nutrition and exercise program designed to reach desired caloric intake, while maintaining appropriate intake of nutrient and fiber, sodium and fats, and appropriate energy expenditure required for the weight goal.;Weight Management: Provide education and appropriate resources to help participant work on and attain dietary goals.;Weight Management/Obesity: Establish reasonable short term and long term weight goals.  Admit Weight  218 lb (98.9 kg)    Goal Weight: Short Term  214 lb (97.1 kg)    Goal Weight: Long Term  195 lb (88.5 kg)    Expected Outcomes  Short Term: Continue to assess and modify interventions until short term weight is achieved;Long Term: Adherence to nutrition and physical activity/exercise program aimed toward attainment of established weight goal;Weight Loss: Understanding of general recommendations for a balanced deficit meal plan, which promotes 1-2 lb weight loss per week and includes a negative energy balance of [phone] kcal/d;Understanding recommendations for meals to include 15-35% energy as protein, 25-35% energy from fat, 35-60% energy from carbohydrates, less than 200mg  of dietary cholesterol, 20-35 gm of total fiber daily;Understanding of distribution of calorie intake throughout the day with the consumption of 4-5 meals/snacks    Hypertension  Yes    Intervention  Provide education on lifestyle modifcations including regular physical activity/exercise, weight management, moderate sodium restriction and increased consumption of fresh fruit, vegetables, and low fat dairy, alcohol moderation, and smoking cessation.;Monitor prescription use compliance.    Expected Outcomes  Short Term: Continued assessment and intervention until BP is < 140/55mm HG in hypertensive participants. < 130/61mm HG in hypertensive participants with diabetes, heart failure or chronic kidney disease.;Long Term: Maintenance of blood pressure at goal levels.    Lipids  Yes    Intervention  Provide education and support for participant on nutrition & aerobic/resistive exercise along with prescribed medications to achieve LDL 70mg , HDL >40mg .    Expected Outcomes  Short Term: Participant states understanding of desired cholesterol values and is compliant with medications prescribed. Participant is following exercise prescription and nutrition guidelines.;Long Term: Cholesterol controlled with medications as prescribed,  with individualized exercise RX and with personalized nutrition plan. Value goals: LDL < 70mg , HDL > 40 mg.       Core Components/Risk Factors/Patient Goals Review:    Core Components/Risk Factors/Patient Goals at Discharge (Final Review):    ITP Comments: ITP Comments    Row Name 07/30/17 1324           ITP Comments  Med review completed. Initial ITP created. Diangosis can be found in [facility] 06/30/17          Comments: Initial ITP

## 2017-07-30 NOTE — Patient Instructions (Signed)
Patient Instructions  Patient Details  Name: KENSHIN SPLAWN MRN: [id number] Date of Birth: 1956-06-01 Referring Provider:  Corey Skains, MD  Below are the personal goals you chose as well as exercise and nutrition goals. Our goal is to help you keep on track towards obtaining and maintaining your goals. We will be discussing your progress on these goals with you throughout the program.  Initial Exercise Prescription: Initial Exercise Prescription - 07/30/17 1400      Date of Initial Exercise RX and Referring Provider   Date  07/30/17    Referring Provider  [name]      Treadmill   MPH  2.5    Grade  2    Minutes  15    METs  3.6      Recumbant Bike   Level  3    RPM  60    Watts  50    Minutes  15    METs  3.6      T5 Nustep   Level  3    SPM  80    Minutes  15    METs  3.6      Prescription Details   Frequency (times per week)  3    Duration  Progress to 45 minutes of aerobic exercise without signs/symptoms of physical distress      Intensity   THRR 40-80% of Max Heartrate  95-138    Ratings of Perceived Exertion  11-13    Perceived Dyspnea  0-4      Resistance Training   Training Prescription  Yes    Weight  4 lb    Reps  10-15       Exercise Goals: Frequency: Be able to perform aerobic exercise three times per week working toward 3-5 days per week.  Intensity: Work with a perceived exertion of 11 (fairly light) - 15 (hard) as tolerated. Follow your new exercise prescription and watch for changes in prescription as you progress with the program. Changes will be reviewed with you when they are made.  Duration: You should be able to do 30 minutes of continuous aerobic exercise in addition to a 5 minute warm-up and a 5 minute cool-down routine.  Nutrition Goals: Your personal nutrition goals will be established when you do your nutrition analysis with the dietician.  The following are nutrition guidelines to follow: Cholesterol < 200mg /day Sodium <  1500mg /day Fiber: Men over 50 yrs - 30 grams per day  Personal Goals: Personal Goals and Risk Factors at Admission - 07/30/17 1327      Core Components/Risk Factors/Patient Goals on Admission    Weight Management  Yes;Weight Loss    Intervention  Weight Management: Develop a combined nutrition and exercise program designed to reach desired caloric intake, while maintaining appropriate intake of nutrient and fiber, sodium and fats, and appropriate energy expenditure required for the weight goal.;Weight Management: Provide education and appropriate resources to help participant work on and attain dietary goals.;Weight Management/Obesity: Establish reasonable short term and long term weight goals.    Admit Weight  218 lb (98.9 kg)    Goal Weight: Short Term  214 lb (97.1 kg)    Goal Weight: Long Term  195 lb (88.5 kg)    Expected Outcomes  Short Term: Continue to assess and modify interventions until short term weight is achieved;Long Term: Adherence to nutrition and physical activity/exercise program aimed toward attainment of established weight goal;Weight Loss: Understanding of general recommendations for a balanced deficit  meal plan, which promotes 1-2 lb weight loss per week and includes a negative energy balance of [phone] kcal/d;Understanding recommendations for meals to include 15-35% energy as protein, 25-35% energy from fat, 35-60% energy from carbohydrates, less than 200mg  of dietary cholesterol, 20-35 gm of total fiber daily;Understanding of distribution of calorie intake throughout the day with the consumption of 4-5 meals/snacks    Hypertension  Yes    Intervention  Provide education on lifestyle modifcations including regular physical activity/exercise, weight management, moderate sodium restriction and increased consumption of fresh fruit, vegetables, and low fat dairy, alcohol moderation, and smoking cessation.;Monitor prescription use compliance.    Expected Outcomes  Short Term:  Continued assessment and intervention until BP is < 140/33mm HG in hypertensive participants. < 130/69mm HG in hypertensive participants with diabetes, heart failure or chronic kidney disease.;Long Term: Maintenance of blood pressure at goal levels.    Lipids  Yes    Intervention  Provide education and support for participant on nutrition & aerobic/resistive exercise along with prescribed medications to achieve LDL 70mg , HDL >40mg .    Expected Outcomes  Short Term: Participant states understanding of desired cholesterol values and is compliant with medications prescribed. Participant is following exercise prescription and nutrition guidelines.;Long Term: Cholesterol controlled with medications as prescribed, with individualized exercise RX and with personalized nutrition plan. Value goals: LDL < 70mg , HDL > 40 mg.       Tobacco Use Initial Evaluation: Social History   Tobacco Use  Smoking Status Former Smoker  . Last attempt to quit: 07/28/1968  . Years since quitting: 49.0  Smokeless Tobacco Never Used    Exercise Goals and Review: Exercise Goals    Row Name 07/30/17 1427             Exercise Goals   Increase Physical Activity  Yes       Intervention  Provide advice, education, support and counseling about physical activity/exercise needs.;Develop an individualized exercise prescription for aerobic and resistive training based on initial evaluation findings, risk stratification, comorbidities and participant's personal goals.       Expected Outcomes  Achievement of increased cardiorespiratory fitness and enhanced flexibility, muscular endurance and strength shown through measurements of functional capacity and personal statement of participant.       Increase Strength and Stamina  Yes       Intervention  Provide advice, education, support and counseling about physical activity/exercise needs.;Develop an individualized exercise prescription for aerobic and resistive training based on  initial evaluation findings, risk stratification, comorbidities and participant's personal goals.       Expected Outcomes  Achievement of increased cardiorespiratory fitness and enhanced flexibility, muscular endurance and strength shown through measurements of functional capacity and personal statement of participant.       Able to understand and use rate of perceived exertion (RPE) scale  Yes       Intervention  Provide education and explanation on how to use RPE scale       Expected Outcomes  Short Term: Able to use RPE daily in rehab to express subjective intensity level;Long Term:  Able to use RPE to guide intensity level when exercising independently       Able to understand and use Dyspnea scale  Yes       Intervention  Provide education and explanation on how to use Dyspnea scale       Expected Outcomes  Short Term: Able to use Dyspnea scale daily in rehab to express subjective sense of shortness of breath  during exertion;Long Term: Able to use Dyspnea scale to guide intensity level when exercising independently       Knowledge and understanding of Target Heart Rate Range (THRR)  Yes       Intervention  Provide education and explanation of THRR including how the numbers were predicted and where they are located for reference       Expected Outcomes  Short Term: Able to state/look up THRR;Long Term: Able to use THRR to govern intensity when exercising independently;Short Term: Able to use daily as guideline for intensity in rehab       Able to check pulse independently  Yes       Intervention  Provide education and demonstration on how to check pulse in carotid and radial arteries.;Review the importance of being able to check your own pulse for safety during independent exercise       Expected Outcomes  Short Term: Able to explain why pulse checking is important during independent exercise;Long Term: Able to check pulse independently and accurately       Understanding of Exercise Prescription   Yes       Intervention  Provide education, explanation, and written materials on patient's individual exercise prescription       Expected Outcomes  Short Term: Able to explain program exercise prescription;Long Term: Able to explain home exercise prescription to exercise independently          Copy of goals given to participant.

## 2017-08-03 ENCOUNTER — Encounter: Payer: 59 | Admitting: *Deleted

## 2017-08-03 DIAGNOSIS — I214 Non-ST elevation (NSTEMI) myocardial infarction: Secondary | ICD-10-CM | POA: Diagnosis not present

## 2017-08-03 NOTE — Progress Notes (Signed)
Daily Session Note  Patient Details  Name: Jason Gross MRN: [id number] Date of Birth: 1956/04/12 Referring Provider:     Cardiac Rehab from 07/30/2017 in [facility] Cardiac and Pulmonary Rehab  Referring Provider  [name]      Encounter Date: 08/03/2017  Check In: Session Check In - 08/03/17 1727      Check-In   Location  [facility]-Cardiac & Pulmonary Rehab    Staff Present  Earlean Shawl, BS, ACSM CEP, Exercise Physiologist;Amanda Oletta Darter, BA, ACSM CEP, Exercise Physiologist;Carroll Enterkin, RN, BSN    Supervising physician immediately available to respond to emergencies  See telemetry face sheet for immediately available ER MD    Medication changes reported      No    Fall or balance concerns reported     No    Warm-up and Cool-down  Performed on first and last piece of equipment    Resistance Training Performed  Yes    VAD Patient?  No      Pain Assessment   Currently in Pain?  No/denies    Multiple Pain Sites  No          Social History   Tobacco Use  Smoking Status Former Smoker  . Last attempt to quit: 07/28/1968  . Years since quitting: 49.0  Smokeless Tobacco Never Used    Goals Met:  Exercise tolerated well Personal goals reviewed No report of cardiac concerns or symptoms Strength training completed today  Goals Unmet:  Not Applicable  Comments: First full day of exercise!  Patient was oriented to gym and equipment including functions, settings, policies, and procedures.  Patient's individual exercise prescription and treatment plan were reviewed.  All starting workloads were established based on the results of the 6 minute walk test done at initial orientation visit.  The plan for exercise progression was also introduced and progression will be customized based on patient's performance and goals.    Dr. Emily Filbert is Medical Director for [place] and [facility].

## 2017-08-05 DIAGNOSIS — I214 Non-ST elevation (NSTEMI) myocardial infarction: Secondary | ICD-10-CM | POA: Diagnosis not present

## 2017-08-05 NOTE — Progress Notes (Signed)
Daily Session Note  Patient Details  Name: Jason Gross MRN: [id number] Date of Birth: 01-27-1956 Referring Provider:     Cardiac Rehab from 07/30/2017 in [facility] Cardiac and Pulmonary Rehab  Referring Provider  [name]      Encounter Date: 08/05/2017  Check In: Session Check In - 08/05/17 1729      Check-In   Location  [facility]-Cardiac & Pulmonary Rehab    Staff Present  Renita Papa, RN Vickki Hearing, BA, ACSM CEP, Exercise Physiologist;Carroll Enterkin, RN, BSN    Supervising physician immediately available to respond to emergencies  See telemetry face sheet for immediately available ER MD    Medication changes reported      No    Fall or balance concerns reported     No    Warm-up and Cool-down  Performed on first and last piece of equipment    Resistance Training Performed  Yes    VAD Patient?  No      Pain Assessment   Currently in Pain?  No/denies        Exercise Prescription Changes - 08/05/17 1200      Response to Exercise   Blood Pressure (Admit)  142/72    Blood Pressure (Exercise)  158/84    Blood Pressure (Exit)  112/78    Heart Rate (Admit)  71 bpm    Heart Rate (Exercise)  123 bpm    Heart Rate (Exit)  96 bpm    Rating of Perceived Exertion (Exercise)  15    Symptoms  none    Duration  Progress to 45 minutes of aerobic exercise without signs/symptoms of physical distress    Intensity  THRR unchanged      Progression   Progression  Continue to progress workloads to maintain intensity without signs/symptoms of physical distress.    Average METs  3.65      Resistance Training   Training Prescription  Yes    Weight  4 lb    Reps  10-15      Treadmill   MPH  2.5    Grade  2    Minutes  15    METs  3.6      Recumbant Bike   Level  3    RPM  60    Watts  50    Minutes  15    METs  3.71       Social History   Tobacco Use  Smoking Status Former Smoker  . Last attempt to quit: 07/28/1968  . Years since quitting: 49.0  Smokeless Tobacco Never  Used    Goals Met:  Independence with exercise equipment Exercise tolerated well No report of cardiac concerns or symptoms Strength training completed today  Goals Unmet:  Not Applicable  Comments: Pt able to follow exercise prescription today without complaint.  Will continue to monitor for progression.    Dr. Emily Filbert is Medical Director for [place] and [facility].

## 2017-08-06 DIAGNOSIS — I214 Non-ST elevation (NSTEMI) myocardial infarction: Secondary | ICD-10-CM | POA: Diagnosis not present

## 2017-08-06 NOTE — Progress Notes (Signed)
Daily Session Note  Patient Details  Name: DEMARKUS REMMEL MRN: [id number] Date of Birth: 05-28-1956 Referring Provider:     Cardiac Rehab from 07/30/2017 in [facility] Cardiac and Pulmonary Rehab  Referring Provider  [name]      Encounter Date: 08/06/2017  Check In: Session Check In - 08/06/17 1650      Check-In   Location  [facility]Cardiac & Pulmonary Rehab    Staff Present  Renita Papa, RN Moises Blood, BS, ACSM CEP, Exercise Physiologist;Dinari Stgermaine Flavia Shipper    Supervising physician immediately available to respond to emergencies  See telemetry face sheet for immediately available ER MD          Social History   Tobacco Use  Smoking Status Former Smoker  . Last attempt to quit: 07/28/1968  . Years since quitting: 49.0  Smokeless Tobacco Never Used    Goals Met:  Independence with exercise equipment Exercise tolerated well No report of cardiac concerns or symptoms Strength training completed today  Goals Unmet:  Not Applicable  Comments: Pt able to follow exercise prescription today without complaint.  Will continue to monitor for progression.   Dr. Emily Filbert is Medical Director for [place] and [facility].

## 2017-08-10 DIAGNOSIS — I214 Non-ST elevation (NSTEMI) myocardial infarction: Secondary | ICD-10-CM | POA: Diagnosis not present

## 2017-08-10 NOTE — Progress Notes (Signed)
Daily Session Note  Patient Details  Name: Jason Gross MRN: [id number] Date of Birth: 1955-11-02 Referring Provider:     Cardiac Rehab from 07/30/2017 in [facility] Cardiac and Pulmonary Rehab  Referring Provider  [name]      Encounter Date: 08/10/2017  Check In: Session Check In - 08/10/17 1721      Check-In   Location  [facility]iac [facility] Rehab    Staff Present  Earlean Shawl, BS, ACSM CEP, Exercise Physiologist;Amanda Oletta Darter, BA, ACSM CEP, Exercise Physiologist;Carroll Enterkin, RN, BSN    Supervising physician immediately available to respond to emergencies  See telemetry face sheet for immediately available ER MD    Medication changes reported      No    Fall or balance concerns reported     No    Warm-up and Cool-down  Performed on first and last piece of equipment    Resistance Training Performed  Yes    VAD Patient?  No      Pain Assessment   Currently in Pain?  No/denies    Multiple Pain Sites  No          Social History   Tobacco Use  Smoking Status Former Smoker  . Last attempt to quit: 07/28/1968  . Years since quitting: 49.0  Smokeless Tobacco Never Used    Goals Met:  Independence with exercise equipment Exercise tolerated well No report of cardiac concerns or symptoms Strength training completed today  Goals Unmet:  Not Applicable  Comments: Pt able to follow exercise prescription today without complaint.  Will continue to monitor for progression.    Dr. Emily Filbert is Medical Director for [place] and [facility].

## 2017-08-12 ENCOUNTER — Encounter: Payer: 59 | Admitting: *Deleted

## 2017-08-12 DIAGNOSIS — I214 Non-ST elevation (NSTEMI) myocardial infarction: Secondary | ICD-10-CM

## 2017-08-12 NOTE — Progress Notes (Signed)
Daily Session Note  Patient Details  Name: Jason Gross MRN: [id number] Date of Birth: 21-Aug-1955 Referring Provider:     Cardiac Rehab from 07/30/2017 in [facility] Cardiac and Pulmonary Rehab  Referring Provider  [name]      Encounter Date: 08/12/2017  Check In: Session Check In - 08/12/17 1721      Check-In   Location  [facility]Cardiac & Pulmonary Rehab    Staff Present  Renita Papa, RN Vickki Hearing, BA, ACSM CEP, Exercise Physiologist;Carroll Enterkin, RN, BSN    Supervising physician immediately available to respond to emergencies  See telemetry face sheet for immediately available ER MD    Medication changes reported      No    Fall or balance concerns reported     No    Warm-up and Cool-down  Performed on first and last piece of equipment    Resistance Training Performed  Yes    VAD Patient?  No      Pain Assessment   Currently in Pain?  No/denies          Social History   Tobacco Use  Smoking Status Former Smoker  . Last attempt to quit: 07/28/1968  . Years since quitting: 49.0  Smokeless Tobacco Never Used    Goals Met:  Independence with exercise equipment Exercise tolerated well No report of cardiac concerns or symptoms Strength training completed today  Goals Unmet:  Not Applicable  Comments: Pt able to follow exercise prescription today without complaint.  Will continue to monitor for progression.    Dr. Emily Filbert is Medical Director for [place] and [facility].

## 2017-08-17 DIAGNOSIS — I214 Non-ST elevation (NSTEMI) myocardial infarction: Secondary | ICD-10-CM

## 2017-08-17 NOTE — Progress Notes (Signed)
Daily Session Note  Patient Details  Name: Jason Gross MRN: [id number] Date of Birth: 02/02/1956 Referring Provider:     Cardiac Rehab from 07/30/2017 in [facility] Cardiac and Pulmonary Rehab  Referring Provider  [name]      Encounter Date: 08/17/2017  Check In: Session Check In - 08/17/17 1712      Check-In   Location  [facility]-Cardiac & Pulmonary Rehab    Staff Present  [place], BS, [place];Nada Maclachlan, BA, ACSM CEP, Exercise Physiologist;Carroll Enterkin, RN, BSN    Supervising physician immediately available to respond to emergencies  See telemetry face sheet for immediately available ER MD    Medication changes reported      No    Fall or balance concerns reported     No    Tobacco Cessation  No Change    Warm-up and Cool-down  Performed on first and last piece of equipment    Resistance Training Performed  Yes    VAD Patient?  No      Pain Assessment   Currently in Pain?  No/denies    Multiple Pain Sites  No          Social History   Tobacco Use  Smoking Status Former Smoker  . Last attempt to quit: 07/28/1968  . Years since quitting: 49.0  Smokeless Tobacco Never Used    Goals Met:  Independence with exercise equipment Exercise tolerated well No report of cardiac concerns or symptoms Strength training completed today  Goals Unmet:  Not Applicable  Comments: Pt able to follow exercise prescription today without complaint.  Will continue to monitor for progression.    Dr. Emily Filbert is Medical Director for [place] and [facility].

## 2017-08-19 DIAGNOSIS — I214 Non-ST elevation (NSTEMI) myocardial infarction: Secondary | ICD-10-CM

## 2017-08-19 NOTE — Progress Notes (Signed)
Daily Session Note  Patient Details  Name: Phillips T Tisdale MRN: [id number] Date of Birth: 01/07/1956 Referring Provider:     Cardiac Rehab from 07/30/2017 in [facility] Cardiac and Pulmonary Rehab  Referring Provider  [place]      Encounter Date: 08/19/2017  Check In: Session Check In - 08/19/17 1642      Check-In   Location  [facility]-Cardiac & Pulmonary Rehab    Staff Present  Mandi Ballard, BS, PEC;Meredith Craven, RN BSN;Amanda Sommer, BA, ACSM CEP, Exercise Physiologist    Supervising physician immediately available to respond to emergencies  See telemetry face sheet for immediately available ER MD    Medication changes reported      No    Fall or balance concerns reported     No    Warm-up and Cool-down  Performed on first and last piece of equipment    Resistance Training Performed  Yes    VAD Patient?  No      Pain Assessment   Currently in Pain?  No/denies    Multiple Pain Sites  No          Social History   Tobacco Use  Smoking Status Former Smoker  . Last attempt to quit: 07/28/1968  . Years since quitting: 49.0  Smokeless Tobacco Never Used    Goals Met:  Independence with exercise equipment Exercise tolerated well No report of cardiac concerns or symptoms Strength training completed today  Goals Unmet:  Not Applicable  Comments: Pt able to follow exercise prescription today without complaint.  Will continue to monitor for progression.    Dr. Mark Miller is Medical Director for [facility] and [facility]. 

## 2017-08-20 DIAGNOSIS — I214 Non-ST elevation (NSTEMI) myocardial infarction: Secondary | ICD-10-CM | POA: Diagnosis not present

## 2017-08-20 NOTE — Progress Notes (Signed)
Daily Session Note  Patient Details  Name: Jason Gross MRN: [id number] Date of Birth: 28-Jun-1956 Referring Provider:     Cardiac Rehab from 07/30/2017 in [facility] Cardiac and Pulmonary Rehab  Referring Provider  [name]      Encounter Date: 08/20/2017  Check In: Session Check In - 08/20/17 1643      Check-In   Location  [facility]-Cardiac & Pulmonary Rehab    Staff Present  Earlean Shawl, BS, ACSM CEP, Exercise Physiologist;Meredith Sherryll Burger, RN BSN;Joseph Flavia Shipper    Supervising physician immediately available to respond to emergencies  See telemetry face sheet for immediately available ER MD    Medication changes reported      No    Fall or balance concerns reported     No    Warm-up and Cool-down  Performed on first and last piece of equipment    Resistance Training Performed  Yes    VAD Patient?  No      Pain Assessment   Currently in Pain?  No/denies          Social History   Tobacco Use  Smoking Status Former Smoker  . Last attempt to quit: 07/28/1968  . Years since quitting: 49.0  Smokeless Tobacco Never Used    Goals Met:  Independence with exercise equipment Exercise tolerated well Personal goals reviewed No report of cardiac concerns or symptoms Strength training completed today  Goals Unmet:  Not Applicable  Comments: Reviewed home exercise with pt today.  Pt plans to walk at home for exercise.  Reviewed THR, pulse, RPE, sign and symptoms, NTG use, and when to call 911 or MD.  Also discussed weather considerations and indoor options.  Pt voiced understanding.   Dr. Emily Filbert is Medical Director for [place] and [facility].

## 2017-08-24 ENCOUNTER — Encounter: Payer: 59 | Admitting: *Deleted

## 2017-08-24 DIAGNOSIS — I214 Non-ST elevation (NSTEMI) myocardial infarction: Secondary | ICD-10-CM | POA: Diagnosis not present

## 2017-08-24 NOTE — Progress Notes (Signed)
Daily Session Note  Patient Details  Name: Jason Gross MRN: [id number] Date of Birth: May 24, 1956 Referring Provider:     Cardiac Rehab from 07/30/2017 in [facility] Cardiac and Pulmonary Rehab  Referring Provider  [name]      Encounter Date: 08/24/2017  Check In: Session Check In - 08/24/17 1630      Check-In   Location  [facility] Re[facility]    Staff Present  Earlean Shawl, BS, ACSM CEP, Exercise Physiologist;Amanda Oletta Darter, BA, ACSM CEP, Exercise Physiologist;Carroll Enterkin, RN, BSN    Supervising physician immediately available to respond to emergencies  See telemetry face sheet for immediately available ER MD    Medication changes reported      No    Fall or balance concerns reported     No    Warm-up and Cool-down  Performed on first and last piece of equipment    Resistance Training Performed  Yes    VAD Patient?  No      Pain Assessment   Currently in Pain?  No/denies    Multiple Pain Sites  No          Social History   Tobacco Use  Smoking Status Former Smoker  . Last attempt to quit: 07/28/1968  . Years since quitting: 49.1  Smokeless Tobacco Never Used    Goals Met:  Independence with exercise equipment Exercise tolerated well No report of cardiac concerns or symptoms Strength training completed today  Goals Unmet:  Not Applicable  Comments: Pt able to follow exercise prescription today without complaint.  Will continue to monitor for progression.    Dr. Emily Filbert is Medical Director for [place] and [facility].

## 2017-08-26 ENCOUNTER — Encounter: Payer: Self-pay | Admitting: *Deleted

## 2017-08-26 DIAGNOSIS — I214 Non-ST elevation (NSTEMI) myocardial infarction: Secondary | ICD-10-CM

## 2017-08-26 NOTE — Progress Notes (Signed)
Cardiac Individual Treatment Plan  Patient Details  Name: Jason Gross MRN: [id number] Date of Birth: [birth year] Referring Provider:     Cardiac Rehab from 07/30/2017 in [facility] Cardiac and Pulmonary Rehab  Referring Provider  [name]      Initial Encounter Date:    Cardiac Rehab from 07/30/2017 in [facility] Cardiac and Pulmonary Rehab  Date  07/30/17  Referring Provider  [name]      Visit Diagnosis: NSTEMI (non-ST elevated myocardial infarction) [facility])  Patient's Home Medications on Admission:  Current Outpatient Medications:  .  aspirin EC 81 MG EC tablet, Take 1 tablet (81 mg total) by mouth daily., Disp: 30 tablet, Rfl: 0 .  atorvastatin (LIPITOR) 40 MG tablet, Take 1 tablet (40 mg total) by mouth daily at 6 PM., Disp: 30 tablet, Rfl: 0 .  cetirizine (ZYRTEC) 10 MG tablet, Take 10 mg by mouth daily.  , Disp: , Rfl:  .  clopidogrel (PLAVIX) 75 MG tablet, Take 1 tablet (75 mg total) by mouth daily., Disp: 30 tablet, Rfl: 0 .  guaiFENesin-codeine 100-10 MG/5ML syrup, Take 10 mLs by mouth every 4 (four) hours as needed for cough., Disp: 120 mL, Rfl: 0 .  HYDROcodone-acetaminophen (NORCO) 5-325 MG tablet, Take 1 tablet by mouth every 12 (twelve) hours as needed for severe pain. (Patient not taking: Reported on 07/30/2017), Disp: 10 tablet, Rfl: 0 .  ibuprofen (ADVIL,MOTRIN) 400 MG tablet, Take 1 tablet (400 mg total) by mouth every 6 (six) hours as needed., Disp: 30 tablet, Rfl: 0 .  lisinopril (PRINIVIL,ZESTRIL) 5 MG tablet, Take 0.5 tablets (2.5 mg total) by mouth daily., Disp: 30 tablet, Rfl: 0 .  metoprolol tartrate (LOPRESSOR) 25 MG tablet, Take 1 tablet (25 mg total) by mouth 2 (two) times daily., Disp: 60 tablet, Rfl: 0  Past Medical History: Past Medical History:  Diagnosis Date  . Allergy   . Benign mass of parotid gland   . GERD (gastroesophageal reflux disease)   . Hyperlipidemia    not on meds  . Hypertension   . Vertigo     Tobacco Use: Social History   Tobacco Use    Smoking Status Former Smoker  . Last attempt to quit: 07/28/1968  . Years since quitting: 49.1  Smokeless Tobacco Never Used    Labs: Recent Chemical engineer    Labs for ITP Cardiac and Pulmonary Rehab Latest Ref Rng & Units 06/30/2017 06/30/2017 06/30/2017 06/30/2017 07/03/2017   Cholestrol 0 - 200 mg/dL - 153 - - -   LDLCALC 0 - 99 mg/dL - 91 - - -   HDL >40 mg/dL - 28(L) - - -   Trlycerides <150 mg/dL - 170(H) 168(H) - -   Hemoglobin A1c 4.8 - 5.6 % - 5.6 - - -   PHART 7.350 - 7.450 7.28(L) - - 7.25(L) 7.41   PCO2ART 32.0 - 48.0 mmHg 43 - - 54(H) 52(H)   HCO3 20.0 - 28.0 mmol/L 20.2 - - 23.1 33.0(H)   ACIDBASEDEF 0.0 - 2.0 mmol/L 6.4(H) - - 5.0(H) -   O2SAT % 96.0 - - 99.8 97.1       Exercise Target Goals:    Exercise Program Goal: Individual exercise prescription set using results from initial 6 min walk test and THRR while considering  patient's activity barriers and safety.   Exercise Prescription Goal: Initial exercise prescription builds to 30-45 minutes a day of aerobic activity, 2-3 days per week.  Home exercise guidelines will be given to patient during program as  part of exercise prescription that the participant will acknowledge.  Activity Barriers & Risk Stratification: Activity Barriers & Cardiac Risk Stratification - 07/30/17 1337      Activity Barriers & Cardiac Risk Stratification   Activity Barriers  Other (comment)    Comments  pain from 4 broken ribs related to CPR    Cardiac Risk Stratification  Moderate       6 Minute Walk: 6 Minute Walk    Row Name 07/30/17 1427         6 Minute Walk   Distance  1400 feet     Walk Time  6 minutes     # of Rest Breaks  0     MPH  2.65     METS  3.77     RPE  11     Perceived Dyspnea   2     VO2 Peak  13.2     Symptoms  Yes (comment)     Comments  vertigo ( makes sure he look sahead not at floor and is fine)     Resting HR  53 bpm     Resting BP  124/68     Resting Oxygen Saturation   98 %     Max  Ex. HR  103 bpm     Max Ex. BP  152/70     2 Minute Post BP  130/74        Oxygen Initial Assessment:   Oxygen Re-Evaluation:   Oxygen Discharge (Final Oxygen Re-Evaluation):   Initial Exercise Prescription: Initial Exercise Prescription - 07/30/17 1400      Date of Initial Exercise RX and Referring Provider   Date  07/30/17    Referring Provider  [name]      Treadmill   MPH  2.5    Grade  2    Minutes  15    METs  3.6      Recumbant Bike   Level  3    RPM  60    Watts  50    Minutes  15    METs  3.6      T5 Nustep   Level  3    SPM  80    Minutes  15    METs  3.6      Prescription Details   Frequency (times per week)  3    Duration  Progress to 45 minutes of aerobic exercise without signs/symptoms of physical distress      Intensity   THRR 40-80% of Max Heartrate  95-138    Ratings of Perceived Exertion  11-13    Perceived Dyspnea  0-4      Resistance Training   Training Prescription  Yes    Weight  4 lb    Reps  10-15       Perform Capillary Blood Glucose checks as needed.  Exercise Prescription Changes: Exercise Prescription Changes    Row Name 07/30/17 1400 08/05/17 1200 08/18/17 1500 08/20/17 1600       Response to Exercise   Blood Pressure (Admit)  124/68  142/72  118/68  -    Blood Pressure (Exercise)  152/70  158/84  156/72  -    Blood Pressure (Exit)  130/74  112/78  -  -    Heart Rate (Admit)  54 bpm  71 bpm  63 bpm  -    Heart Rate (Exercise)  103 bpm  123 bpm  114 bpm  -  Heart Rate (Exit)  103 bpm  96 bpm  73 bpm  -    Oxygen Saturation (Admit)  98 %  -  -  -    Rating of Perceived Exertion (Exercise)  '12  15  14  '$ -    Symptoms  -  none  none  -    Duration  -  Progress to 45 minutes of aerobic exercise without signs/symptoms of physical distress  Progress to 45 minutes of aerobic exercise without signs/symptoms of physical distress  -    Intensity  -  THRR unchanged  THRR unchanged  -      Progression   Progression  -   Continue to progress workloads to maintain intensity without signs/symptoms of physical distress.  Continue to progress workloads to maintain intensity without signs/symptoms of physical distress.  -    Average METs  -  3.65  3.67  -      Resistance Training   Training Prescription  -  Yes  Yes  -    Weight  -  4 lb  4 lb  -    Reps  -  10-15  10-15  -      Treadmill   MPH  -  2.5  -  -    Grade  -  2  -  -    Minutes  -  15  -  -    METs  -  3.6  -  -      Recumbant Bike   Level  -  3  4  -    RPM  -  60  60  -    Watts  -  50  50  -    Minutes  -  15  15  -    METs  -  3.71  3.7  -      Home Exercise Plan   Plans to continue exercise at  -  -  -  Home (comment) add 1 day of walking    Frequency  -  -  -  Add 1 additional day to program exercise sessions.    Initial Home Exercises Provided  -  -  -  08/20/17       Exercise Comments: Exercise Comments    Row Name 08/03/17 1731           Exercise Comments   First full day of exercise!  Patient was oriented to gym and equipment including functions, settings, policies, and procedures.  Patient's individual exercise prescription and treatment plan were reviewed.  All starting workloads were established based on the results of the 6 minute walk test done at initial orientation visit.  The plan for exercise progression was also introduced and progression will be customized based on patient's performance and goals.          Exercise Goals and Review: Exercise Goals    Row Name 07/30/17 1427             Exercise Goals   Increase Physical Activity  Yes       Intervention  Provide advice, education, support and counseling about physical activity/exercise needs.;Develop an individualized exercise prescription for aerobic and resistive training based on initial evaluation findings, risk stratification, comorbidities and participant's personal goals.       Expected Outcomes  Achievement of increased cardiorespiratory fitness and  enhanced flexibility, muscular endurance and strength shown through measurements of functional capacity and personal statement of  participant.       Increase Strength and Stamina  Yes       Intervention  Provide advice, education, support and counseling about physical activity/exercise needs.;Develop an individualized exercise prescription for aerobic and resistive training based on initial evaluation findings, risk stratification, comorbidities and participant's personal goals.       Expected Outcomes  Achievement of increased cardiorespiratory fitness and enhanced flexibility, muscular endurance and strength shown through measurements of functional capacity and personal statement of participant.       Able to understand and use rate of perceived exertion (RPE) scale  Yes       Intervention  Provide education and explanation on how to use RPE scale       Expected Outcomes  Short Term: Able to use RPE daily in rehab to express subjective intensity level;Long Term:  Able to use RPE to guide intensity level when exercising independently       Able to understand and use Dyspnea scale  Yes       Intervention  Provide education and explanation on how to use Dyspnea scale       Expected Outcomes  Short Term: Able to use Dyspnea scale daily in rehab to express subjective sense of shortness of breath during exertion;Long Term: Able to use Dyspnea scale to guide intensity level when exercising independently       Knowledge and understanding of Target Heart Rate Range (THRR)  Yes       Intervention  Provide education and explanation of THRR including how the numbers were predicted and where they are located for reference       Expected Outcomes  Short Term: Able to state/look up THRR;Long Term: Able to use THRR to govern intensity when exercising independently;Short Term: Able to use daily as guideline for intensity in rehab       Able to check pulse independently  Yes       Intervention  Provide education and  demonstration on how to check pulse in carotid and radial arteries.;Review the importance of being able to check your own pulse for safety during independent exercise       Expected Outcomes  Short Term: Able to explain why pulse checking is important during independent exercise;Long Term: Able to check pulse independently and accurately       Understanding of Exercise Prescription  Yes       Intervention  Provide education, explanation, and written materials on patient's individual exercise prescription       Expected Outcomes  Short Term: Able to explain program exercise prescription;Long Term: Able to explain home exercise prescription to exercise independently          Exercise Goals Re-Evaluation : Exercise Goals Re-Evaluation    Row Name 08/18/17 1521 08/20/17 1644           Exercise Goal Re-Evaluation   Exercise Goals Review  Increase Physical Activity;Able to understand and use rate of perceived exertion (RPE) scale;Increase Strength and Stamina  Increase Physical Activity;Increase Strength and Stamina;Able to check pulse independently      Comments  Zayvion is tolerating exercise well. Staff will continue to monitor.  Reviewed home exercise with pt today.  Pt plans to walk at home for exercise.  Reviewed THR, pulse, RPE, sign and symptoms, NTG use, and when to call 911 or MD.  Also discussed weather considerations and indoor options.  Pt voiced understanding. Practice checking pulse       Expected Outcomes  Short -  [place] will increase levels on all machines.  Long - Viral will improve overall MET level  Short: add 1 day of walking at home, practice checking his pulse. Long: add more days to home exercise, check pulse independently         Discharge Exercise Prescription (Final Exercise Prescription Changes): Exercise Prescription Changes - 08/20/17 1600      Home Exercise Plan   Plans to continue exercise at  Home (comment) add 1 day of walking    Frequency  Add 1 additional day  to program exercise sessions.    Initial Home Exercises Provided  08/20/17       Nutrition:  Target Goals: Understanding of nutrition guidelines, daily intake of sodium '1500mg'$ , cholesterol '200mg'$ , calories 30% from fat and 7% or less from saturated fats, daily to have 5 or more servings of fruits and vegetables.  Biometrics: Pre Biometrics - 07/30/17 1425      Pre Biometrics   Height  '5\' 11"'$  (1.803 m)    Weight  208 lb 3.2 oz (94.4 kg)    Waist Circumference  44 inches    Hip Circumference  42.25 inches    Waist to Hip Ratio  1.04 %    BMI (Calculated)  29.05    Single Leg Stand  30 seconds        Nutrition Therapy Plan and Nutrition Goals: Nutrition Therapy & Goals - 07/30/17 1329      Intervention Plan   Intervention  Prescribe, educate and counsel regarding individualized specific dietary modifications aiming towards targeted core components such as weight, hypertension, lipid management, diabetes, heart failure and other comorbidities.;Nutrition handout(s) given to patient.    Expected Outcomes  Short Term Goal: Understand basic principles of dietary content, such as calories, fat, sodium, cholesterol and nutrients.;Short Term Goal: A plan has been developed with personal nutrition goals set during dietitian appointment.;Long Term Goal: Adherence to prescribed nutrition plan.       Nutrition Assessments: Nutrition Assessments - 07/30/17 1330      MEDFICTS Scores   Pre Score  -- will bring Medficts to first day of class       Nutrition Goals Re-Evaluation:   Nutrition Goals Discharge (Final Nutrition Goals Re-Evaluation):   Psychosocial: Target Goals: Acknowledge presence or absence of significant depression and/or stress, maximize coping skills, provide positive support system. Participant is able to verbalize types and ability to use techniques and skills needed for reducing stress and depression.   Initial Review & Psychosocial Screening: Initial Psych Review  & Screening - 07/30/17 1330      Initial Review   Current issues with  Current Sleep Concerns pain from broken ribs related to CPR, but getting better      [place]?  Yes Wife and family      Screening Interventions   Interventions  Yes;Encouraged to exercise;Program counselor consult    Expected Outcomes  Short Term goal: Utilizing psychosocial counselor, staff and physician to assist with identification of specific Stressors or current issues interfering with healing process. Setting desired goal for each stressor or current issue identified.;Short Term goal: Identification and review with participant of any Quality of Life or Depression concerns found by scoring the questionnaire.;Long Term Goal: Stressors or current issues are controlled or eliminated.;Long Term goal: The participant improves quality of Life and PHQ9 Scores as seen by post scores and/or verbalization of changes       Quality of Life Scores:  Quality of Life -  07/30/17 1201      Quality of Life Scores   Health/Function Pre  29.14 %    Socioeconomic Pre  30 %    Psych/Spiritual Pre  30 %    Family Pre  30 %    GLOBAL Pre  29.64 %      Scores of 19 and below usually indicate a poorer quality of life in these areas.  A difference of  2-3 points is a clinically meaningful difference.  A difference of 2-3 points in the total score of the Quality of Life Index has been associated with significant improvement in overall quality of life, self-image, physical symptoms, and general health in studies assessing change in quality of life.  PHQ-9: Recent Review Flowsheet Data    Depression screen [facility] 2/9 07/30/2017 03/12/2017   Decreased Interest 0 0   Down, Depressed, Hopeless 0 0   PHQ - 2 Score 0 0   Altered sleeping 1  -   Tired, decreased energy 0 -   Change in appetite 0 -   Feeling bad or failure about yourself  0 -   Trouble concentrating 0 -   Moving slowly or fidgety/restless 0 -    Suicidal thoughts 0 -   PHQ-9 Score 1 -   Difficult doing work/chores Not difficult at all -     Interpretation of Total Score  Total Score Depression Severity:  1-4 = Minimal depression, 5-9 = Mild depression, 10-14 = Moderate depression, 15-19 = Moderately severe depression, 20-27 = Severe depression   Psychosocial Evaluation and Intervention: Psychosocial Evaluation - 08/03/17 1738      Psychosocial Evaluation & Interventions   Interventions  Encouraged to exercise with the program and follow exercise prescription;Stress management education    Comments  Counselor met with Mr. Uvaldo Bristle) today for initial psychosocial evaluation.  He is a 62 year old who had a heart attack on 12/7.  Wilian has a strong support system with a spouse of 79 years; a son locally and parents who live close by.  Wissam reports being in good health overall with the exception of vertigo and his heart condition.  He sleeps well and has a good appetite.  Shawnee denies a history of depression or anxiety or any current symptoms and states he is typically in a positive mood.  Aadarsh has some stress in his life with a mother who has dementia and also not being able to return to work due to the heavy lifting limits currently.  This is stressful financially but he reports his spouse is working and they are able to make ends meet.  Erico has goals to get stronger and get back to work as soon as possible.  Staff will follow with Herbie Baltimore throughout the course of this program.      Expected Outcomes  Zakar will benefit with consistent exercise to achieve his stated goals.  The educational and psychoeducational aspects of this program will be helpful in understanding and coping more positively with his condition.  Staff will follow with Herbie Baltimore.      Continue Psychosocial Services   Follow up required by staff       Psychosocial Re-Evaluation:   Psychosocial Discharge (Final Psychosocial Re-Evaluation):   Vocational  Rehabilitation: Provide vocational rehab assistance to qualifying candidates.   Vocational Rehab Evaluation & Intervention: Vocational Rehab - 07/30/17 1336      Initial Vocational Rehab Evaluation & Intervention   Assessment shows need for Vocational Rehabilitation  No  Education: Education Goals: Education classes will be provided on a variety of topics geared toward better understanding of heart health and risk factor modification. Participant will state understanding/return demonstration of topics presented as noted by education test scores.  Learning Barriers/Preferences: Learning Barriers/Preferences - 07/30/17 1334      Learning Barriers/Preferences   Learning Barriers  None    Learning Preferences  None       Education Topics:  AED/CPR: - Group verbal and written instruction with the use of models to demonstrate the basic use of the AED with the basic ABC's of resuscitation.   General Nutrition Guidelines/Fats and Fiber: -Group instruction provided by verbal, written material, models and posters to present the general guidelines for heart healthy nutrition. Gives an explanation and review of dietary fats and fiber.   Cardiac Rehab from 08/24/2017 in [facility] Cardiac and Pulmonary Rehab  Date  08/24/17  Educator  PI  Instruction Review Code  1- Verbalizes Understanding      Controlling Sodium/Reading Food Labels: -Group verbal and written material supporting the discussion of sodium use in heart healthy nutrition. Review and explanation with models, verbal and written materials for utilization of the food label.   Exercise Physiology & General Exercise Guidelines: - Group verbal and written instruction with models to review the exercise physiology of the cardiovascular system and associated critical values. Provides general exercise guidelines with specific guidelines to those with heart or lung disease.    Aerobic Exercise & Resistance Training: - Gives group  verbal and written instruction on the various components of exercise. Focuses on aerobic and resistive training programs and the benefits of this training and how to safely progress through these programs..   Flexibility, Balance, Mind/Body Relaxation: Provides group verbal/written instruction on the benefits of flexibility and balance training, including mind/body exercise modes such as yoga, pilates and tai chi.  Demonstration and skill practice provided.   Cardiac Rehab from 08/24/2017 in [facility] Cardiac and Pulmonary Rehab  Date  08/03/17  Educator  AS  Instruction Review Code  1- Verbalizes Understanding      Stress and Anxiety: - Provides group verbal and written instruction about the health risks of elevated stress and causes of high stress.  Discuss the correlation between heart/lung disease and anxiety and treatment options. Review healthy ways to manage with stress and anxiety.   Cardiac Rehab from 08/24/2017 in [facility] Cardiac and Pulmonary Rehab  Date  08/19/17  Educator  [facility]  Instruction Review Code  1- Verbalizes Understanding      Depression: - Provides group verbal and written instruction on the correlation between heart/lung disease and depressed mood, treatment options, and the stigmas associated with seeking treatment.   Anatomy & Physiology of the Heart: - Group verbal and written instruction and models provide basic cardiac anatomy and physiology, with the coronary electrical and arterial systems. Review of Valvular disease and Heart Failure   Cardiac Rehab from 08/24/2017 in [facility] Cardiac and Pulmonary Rehab  Date  08/17/17  Educator  CE  Instruction Review Code  1- Verbalizes Understanding      Cardiac Procedures: - Group verbal and written instruction to review commonly prescribed medications for heart disease. Reviews the medication, class of the drug, and side effects. Includes the steps to properly store meds and maintain the prescription regimen. (beta blockers and  nitrates)   Cardiac Medications I: - Group verbal and written instruction to review commonly prescribed medications for heart disease. Reviews the medication, class of the drug, and side  effects. Includes the steps to properly store meds and maintain the prescription regimen.   Cardiac Rehab from 08/24/2017 in [facility] Cardiac and Pulmonary Rehab  Date  08/10/17 [Part 2 08/12/17 CE]  Educator  [facility]  Instruction Review Code  1- Verbalizes Understanding      Cardiac Medications II: -Group verbal and written instruction to review commonly prescribed medications for heart disease. Reviews the medication, class of the drug, and side effects. (all other drug classes)   Cardiac Rehab from 08/24/2017 in [facility] Cardiac and Pulmonary Rehab  Date  08/05/17 [risk actors]  Educator  Tat Momoli  Instruction Review Code  1- [employer] Understanding       Go Sex-Intimacy & Heart Disease, Get SMART - Goal Setting: - Group verbal and written instruction through game format to discuss heart disease and the return to sexual intimacy. Provides group verbal and written material to discuss and apply goal setting through the application of the S.M.A.R.T. Method.   Other Matters of the Heart: - Provides group verbal, written materials and models to describe Stable Angina and Peripheral Artery. Includes description of the disease process and treatment options available to the cardiac patient.   Exercise & Equipment Safety: - Individual verbal instruction and demonstration of equipment use and safety with use of the equipment.   Cardiac Rehab from 08/24/2017 in [facility] Cardiac and Pulmonary Rehab  Date  07/30/17  Educator  [facility]  Instruction Review Code  1- Verbalizes Understanding      Infection Prevention: - Provides verbal and written material to individual with discussion of infection control including proper hand washing and proper equipment cleaning during exercise session.   Cardiac Rehab from 08/24/2017 in [facility] Cardiac  and Pulmonary Rehab  Date  07/30/17  Educator  [facility]  Instruction Review Code  1- Verbalizes Understanding      Falls Prevention: - Provides verbal and written material to individual with discussion of falls prevention and safety.   Cardiac Rehab from 08/24/2017 in [facility] Cardiac and Pulmonary Rehab  Date  07/30/17  Educator  [facility]  Instruction Review Code  1- Verbalizes Understanding      Diabetes: - Individual verbal and written instruction to review signs/symptoms of diabetes, desired ranges of glucose level fasting, after meals and with exercise. Acknowledge that pre and post exercise glucose checks will be done for 3 sessions at entry of program.   Know Your Numbers and Risk Factors: -Group verbal and written instruction about important numbers in your health.  Discussion of what are risk factors and how they play a role in the disease process.  Review of Cholesterol, Blood Pressure, Diabetes, and BMI and the role they play in your overall health.   Cardiac Rehab from 08/24/2017 in [facility] Cardiac and Pulmonary Rehab  Date  08/05/17 Jeanmarie Plant actors]  Educator  [facility]  Instruction Review Code  1- [employer] Understanding      Sleep Hygiene: -Provides group verbal and written instruction about how sleep can affect your health.  Define sleep hygiene, discuss sleep cycles and impact of sleep habits. Review good sleep hygiene tips.    Other: -Provides group and verbal instruction on various topics (see comments)   Knowledge Questionnaire Score: Knowledge Questionnaire Score - 07/30/17 1335      Knowledge Questionnaire Score   Pre Score  23/28 correct answers reviewed with Herbie Baltimore       Core Components/Risk Factors/Patient Goals at Admission: Personal Goals and Risk Factors at Admission - 07/30/17 1327      Core Components/Risk  Factors/Patient Goals on Admission    Weight Management  Yes;Weight Loss    Intervention  Weight Management: Develop a combined nutrition and exercise program  designed to reach desired caloric intake, while maintaining appropriate intake of nutrient and fiber, sodium and fats, and appropriate energy expenditure required for the weight goal.;Weight Management: Provide education and appropriate resources to help participant work on and attain dietary goals.;Weight Management/Obesity: Establish reasonable short term and long term weight goals.    Admit Weight  218 lb (98.9 kg)    Goal Weight: Short Term  214 lb (97.1 kg)    Goal Weight: Long Term  195 lb (88.5 kg)    Expected Outcomes  Short Term: Continue to assess and modify interventions until short term weight is achieved;Long Term: Adherence to nutrition and physical activity/exercise program aimed toward attainment of established weight goal;Weight Loss: Understanding of general recommendations for a balanced deficit meal plan, which promotes 1-2 lb weight loss per week and includes a negative energy balance of [phone] kcal/d;Understanding recommendations for meals to include 15-35% energy as protein, 25-35% energy from fat, 35-60% energy from carbohydrates, less than '200mg'$  of dietary cholesterol, 20-35 gm of total fiber daily;Understanding of distribution of calorie intake throughout the day with the consumption of 4-5 meals/snacks    Hypertension  Yes    Intervention  Provide education on lifestyle modifcations including regular physical activity/exercise, weight management, moderate sodium restriction and increased consumption of fresh fruit, vegetables, and low fat dairy, alcohol moderation, and smoking cessation.;Monitor prescription use compliance.    Expected Outcomes  Short Term: Continued assessment and intervention until BP is < 140/22m HG in hypertensive participants. < 130/822mHG in hypertensive participants with diabetes, heart failure or chronic kidney disease.;Long Term: Maintenance of blood pressure at goal levels.    Lipids  Yes    Intervention  Provide education and support for participant  on nutrition & aerobic/resistive exercise along with prescribed medications to achieve LDL '70mg'$ , HDL >'40mg'$ .    Expected Outcomes  Short Term: Participant states understanding of desired cholesterol values and is compliant with medications prescribed. Participant is following exercise prescription and nutrition guidelines.;Long Term: Cholesterol controlled with medications as prescribed, with individualized exercise RX and with personalized nutrition plan. Value goals: LDL < '70mg'$ , HDL > 40 mg.       Core Components/Risk Factors/Patient Goals Review:    Core Components/Risk Factors/Patient Goals at Discharge (Final Review):    ITP Comments: ITP Comments    Row Name 07/30/17 1324 08/26/17 0635         ITP Comments  Med review completed. Initial ITP created. [facility]  [facility] Day review. Continue with ITP unless directed changes per Medical Director review.  New to program         Comments:

## 2017-08-26 NOTE — Progress Notes (Signed)
Daily Session Note  Patient Details  Name: Jason Gross MRN: [id number] Date of Birth: [birth year] Referring Provider:     Cardiac Rehab from 07/30/2017 in [facility] Cardiac and Pulmonary Rehab  Referring Provider  [name]      Encounter Date: 08/26/2017  Check In: Session Check In - 08/26/17 1710      Check-In   Location  [facility]-Cardiac & Pulmonary Rehab    Staff Present  Renita Papa, RN Vickki Hearing, BA, ACSM CEP, Exercise Physiologist;Carroll Enterkin, RN, BSN    Supervising physician immediately available to respond to emergencies  See telemetry face sheet for immediately available ER MD    Medication changes reported      No    Fall or balance concerns reported     No    Warm-up and Cool-down  Performed on first and last piece of equipment    Resistance Training Performed  Yes    VAD Patient?  No      Pain Assessment   Currently in Pain?  No/denies          Social History   Tobacco Use  Smoking Status Former Smoker  . Last attempt to quit: 07/28/1968  . Years since quitting: 49.1  Smokeless Tobacco Never Used    Goals Met:  Independence with exercise equipment Exercise tolerated well No report of cardiac concerns or symptoms Strength training completed today  Goals Unmet:  Not Applicable  Comments: Pt able to follow exercise prescription today without complaint.  Will continue to monitor for progression.    Dr. Emily Filbert is Medical Director for [place] and [facility].

## 2017-08-27 DIAGNOSIS — I214 Non-ST elevation (NSTEMI) myocardial infarction: Secondary | ICD-10-CM | POA: Diagnosis not present

## 2017-08-27 NOTE — Progress Notes (Signed)
Daily Session Note  Patient Details  Name: TRENELL CONCANNON MRN: [id number] Date of Birth: 01/27/1956 Referring Provider:     Cardiac Rehab from 07/30/2017 in [facility] Cardiac and Pulmonary Rehab  Referring Provider  [name]      Encounter Date: 08/27/2017  Check In: Session Check In - 08/27/17 1718      Check-In   Location  [facility]-Cardiac & Pulmonary Rehab    Staff Present  Earlean Shawl, BS, ACSM CEP, Exercise Physiologist;Meredith Sherryll Burger, RN BSN;Maybelline Kolarik Flavia Shipper    Supervising physician immediately available to respond to emergencies  See telemetry face sheet for immediately available ER MD    Medication changes reported      No    Fall or balance concerns reported     No    Warm-up and Cool-down  Performed on first and last piece of equipment    Resistance Training Performed  Yes    VAD Patient?  No      Pain Assessment   Currently in Pain?  No/denies          Social History   Tobacco Use  Smoking Status Former Smoker  . Last attempt to quit: 07/28/1968  . Years since quitting: 49.1  Smokeless Tobacco Never Used    Goals Met:  Independence with exercise equipment Exercise tolerated well No report of cardiac concerns or symptoms Strength training completed today  Goals Unmet:  Not Applicable  Comments: Pt able to follow exercise prescription today without complaint.  Will continue to monitor for progression.   Dr. Emily Filbert is Medical Director for [place] and [facility].

## 2017-08-31 ENCOUNTER — Encounter: Payer: 59 | Attending: Internal Medicine | Admitting: *Deleted

## 2017-08-31 DIAGNOSIS — I214 Non-ST elevation (NSTEMI) myocardial infarction: Secondary | ICD-10-CM | POA: Diagnosis not present

## 2017-08-31 NOTE — Progress Notes (Signed)
Daily Session Note  Patient Details  Name: Jason Gross MRN: [id number] Date of Birth: 11/10/1955 Referring Provider:     Cardiac Rehab from 07/30/2017 in [facility] Cardiac and Pulmonary Rehab  Referring Provider  Jason Gross      Encounter Date: 08/31/2017  Check In: Session Check In - 08/31/17 1631      Check-In   Location  [facility]-Cardiac & Pulmonary Rehab    Staff Present  Earlean Shawl, BS, ACSM CEP, Exercise Physiologist;Amanda Oletta Darter, BA, ACSM CEP, Exercise Physiologist;Carroll Enterkin, RN, BSN    Supervising physician immediately available to respond to emergencies  See telemetry face sheet for immediately available ER MD    Medication changes reported      No    Fall or balance concerns reported     No    Warm-up and Cool-down  Performed on first and last piece of equipment    Resistance Training Performed  Yes    VAD Patient?  No      Pain Assessment   Currently in Pain?  No/denies    Multiple Pain Sites  No          Social History   Tobacco Use  Smoking Status Former Smoker  . Last attempt to quit: 07/28/1968  . Years since quitting: 49.1  Smokeless Tobacco Never Used    Goals Met:  Independence with exercise equipment Exercise tolerated well No report of cardiac concerns or symptoms Strength training completed today  Goals Unmet:  Not Applicable  Comments: Pt able to follow exercise prescription today without complaint.  Will continue to monitor for progression.    Dr. Emily Gross is Medical Director for [place] and [facility].

## 2017-09-02 ENCOUNTER — Encounter: Payer: 59 | Admitting: *Deleted

## 2017-09-02 DIAGNOSIS — I214 Non-ST elevation (NSTEMI) myocardial infarction: Secondary | ICD-10-CM | POA: Diagnosis not present

## 2017-09-02 NOTE — Progress Notes (Signed)
Daily Session Note  Patient Details  Name: Jason Gross MRN: [id number] Date of Birth: 1956/03/28 Referring Provider:     Cardiac Rehab from 07/30/2017 in [facility] Cardiac and Pulmonary Rehab  Referring Provider  [name]      Encounter Date: 09/02/2017  Check In: Session Check In - 09/02/17 1633      Check-In   Location  [facility]-Cardiac & Pulmonary Rehab    Staff Present  Renita Papa, RN Vickki Hearing, BA, ACSM CEP, Exercise Physiologist;Carroll Enterkin, RN, BSN    Supervising physician immediately available to respond to emergencies  See telemetry face sheet for immediately available ER MD    Medication changes reported      No    Fall or balance concerns reported     No    Warm-up and Cool-down  Performed on first and last piece of equipment    Resistance Training Performed  Yes    VAD Patient?  No      Pain Assessment   Currently in Pain?  No/denies        Exercise Prescription Changes - 09/02/17 1200      Response to Exercise   Blood Pressure (Admit)  110/60    Blood Pressure (Exercise)  176/76    Heart Rate (Admit)  60 bpm    Heart Rate (Exercise)  118 bpm    Heart Rate (Exit)  74 bpm    Rating of Perceived Exertion (Exercise)  15    Symptoms  none    Duration  Progress to 45 minutes of aerobic exercise without signs/symptoms of physical distress    Intensity  THRR unchanged      Progression   Progression  Continue to progress workloads to maintain intensity without signs/symptoms of physical distress.    Average METs  3.4      Resistance Training   Training Prescription  Yes    Weight  4 lb    Reps  10-15      Interval Training   Interval Training  No      Treadmill   MPH  3    Grade  2.5    Minutes  15    METs  4.54      T5 Nustep   Level  5    SPM  80    Minutes  15    METs  2.8      Home Exercise Plan   Plans to continue exercise at  Home (comment) add 1 day of walking    Frequency  Add 1 additional day to program exercise sessions.    Initial Home Exercises Provided  08/20/17       Social History   Tobacco Use  Smoking Status Former Smoker  . Last attempt to quit: 07/28/1968  . Years since quitting: 49.1  Smokeless Tobacco Never Used    Goals Met:  Independence with exercise equipment Exercise tolerated well No report of cardiac concerns or symptoms Strength training completed today  Goals Unmet:  Not Applicable  Comments: Pt able to follow exercise prescription today without complaint.  Will continue to monitor for progression.    Dr. Emily Filbert is Medical Director for [place] and [facility].

## 2017-09-03 DIAGNOSIS — I214 Non-ST elevation (NSTEMI) myocardial infarction: Secondary | ICD-10-CM

## 2017-09-03 NOTE — Progress Notes (Signed)
Daily Session Note  Patient Details  Name: Jason Gross MRN: [id number] Date of Birth: June 10, 1956 Referring Provider:     Cardiac Rehab from 07/30/2017 in [facility] Cardiac and Pulmonary Rehab  Referring Provider  [name]      Encounter Date: 09/03/2017  Check In: Session Check In - 09/03/17 1643      Check-In   Location  [facility]-Cardiac & Pulmonary Rehab    Staff Present  Earlean Shawl, BS, ACSM CEP, Exercise Physiologist;Meredith Sherryll Burger, RN BSN;Merlin Golden Flavia Shipper    Supervising physician immediately available to respond to emergencies  See telemetry face sheet for immediately available ER MD    Medication changes reported      No    Fall or balance concerns reported     No    Warm-up and Cool-down  Performed on first and last piece of equipment    Resistance Training Performed  Yes    VAD Patient?  No      Pain Assessment   Currently in Pain?  No/denies          Social History   Tobacco Use  Smoking Status Former Smoker  . Last attempt to quit: 07/28/1968  . Years since quitting: 49.1  Smokeless Tobacco Never Used    Goals Met:  Independence with exercise equipment Exercise tolerated well No report of cardiac concerns or symptoms Strength training completed today  Goals Unmet:  Not Applicable  Comments: Pt able to follow exercise prescription today without complaint.  Will continue to monitor for progression.   Dr. Emily Filbert is Medical Director for [place] and [facility].

## 2017-09-07 ENCOUNTER — Encounter: Payer: 59 | Admitting: *Deleted

## 2017-09-07 DIAGNOSIS — I214 Non-ST elevation (NSTEMI) myocardial infarction: Secondary | ICD-10-CM | POA: Diagnosis not present

## 2017-09-07 NOTE — Progress Notes (Signed)
Daily Session Note  Patient Details  Name: Jason Gross MRN: [id number] Date of Birth: Dec 22, 1955 Referring Provider:     Cardiac Rehab from 07/30/2017 in [facility] Cardiac and Pulmonary Rehab  Referring Provider  [name]      Encounter Date: 09/07/2017  Check In: Session Check In - 09/07/17 1722      Check-In   Location  [facility]-Cardiac & Pulmonary Rehab    Staff Present  Nyoka Cowden, RN, BSN, [place], BS, ACSM CEP, Exercise Physiologist;Amanda Oletta Darter, [place], ACSM CEP, Exercise Physiologist;Joseph Flavia Shipper    Supervising physician immediately available to respond to emergencies  See telemetry face sheet for immediately available ER MD    Medication changes reported      No    Fall or balance concerns reported     No    Warm-up and Cool-down  Performed on first and last piece of equipment    Resistance Training Performed  Yes    VAD Patient?  No      Pain Assessment   Currently in Pain?  No/denies    Multiple Pain Sites  No          Social History   Tobacco Use  Smoking Status Former Smoker  . Last attempt to quit: 07/28/1968  . Years since quitting: 49.1  Smokeless Tobacco Never Used    Goals Met:  Independence with exercise equipment Exercise tolerated well No report of cardiac concerns or symptoms Strength training completed today  Goals Unmet:  Not Applicable  Comments: Pt able to follow exercise prescription today without complaint.  Will continue to monitor for progression.    Dr. Emily Filbert is Medical Director for [place] and [facility].

## 2017-09-14 DIAGNOSIS — I214 Non-ST elevation (NSTEMI) myocardial infarction: Secondary | ICD-10-CM | POA: Diagnosis not present

## 2017-09-14 NOTE — Progress Notes (Signed)
Daily Session Note  Patient Details  Name: Jason Gross MRN: [id number] Date of Birth: [birth year] Referring Provider:     Cardiac Rehab from 07/30/2017 in [facility] Cardiac and Pulmonary Rehab  Referring Provider  [name]      Encounter Date: 09/14/2017  Check In: Session Check In - 09/14/17 1628      Check-In   Location  [facility]-Cardiac & Pulmonary Rehab    Staff Present  Earlean Shawl, BS, ACSM CEP, Exercise Physiologist;Amanda Oletta Darter, BA, ACSM CEP, Exercise Physiologist;Carroll Enterkin, RN, BSN    Supervising physician immediately available to respond to emergencies  See telemetry face sheet for immediately available ER MD    Medication changes reported      No    Fall or balance concerns reported     No    Warm-up and Cool-down  Performed on first and last piece of equipment    Resistance Training Performed  Yes    VAD Patient?  No      Pain Assessment   Currently in Pain?  No/denies    Multiple Pain Sites  No          Social History   Tobacco Use  Smoking Status Former Smoker  . Last attempt to quit: 07/28/1968  . Years since quitting: 49.1  Smokeless Tobacco Never Used    Goals Met:  Independence with exercise equipment Exercise tolerated well No report of cardiac concerns or symptoms Strength training completed today  Goals Unmet:  Not Applicable  Comments: Pt able to follow exercise prescription today without complaint.  Will continue to monitor for progression.    Dr. Emily Filbert is Medical Director for [place] and [facility].

## 2017-09-16 ENCOUNTER — Encounter: Payer: 59 | Admitting: *Deleted

## 2017-09-16 DIAGNOSIS — I214 Non-ST elevation (NSTEMI) myocardial infarction: Secondary | ICD-10-CM

## 2017-09-16 NOTE — Progress Notes (Signed)
Daily Session Note  Patient Details  Name: OSEI ANGER MRN: [id number] Date of Birth: [birth year] Referring Provider:     Cardiac Rehab from 07/30/2017 in [facility] Cardiac and Pulmonary Rehab  Referring Provider  [name]      Encounter Date: 09/16/2017  Check In: Session Check In - 09/16/17 1634      Check-In   Staff Present  Renita Papa, RN Vickki Hearing, BA, ACSM CEP, Exercise Physiologist;Carroll Enterkin, RN, BSN    Supervising physician immediately available to respond to emergencies  See telemetry face sheet for immediately available ER MD    Medication changes reported      No    Fall or balance concerns reported     No    Warm-up and Cool-down  Performed on first and last piece of equipment    Resistance Training Performed  Yes    VAD Patient?  No      Pain Assessment   Currently in Pain?  No/denies          Social History   Tobacco Use  Smoking Status Former Smoker  . Last attempt to quit: 07/28/1968  . Years since quitting: 49.1  Smokeless Tobacco Never Used    Goals Met:  Independence with exercise equipment Exercise tolerated well No report of cardiac concerns or symptoms Strength training completed today  Goals Unmet:  Not Applicable  Comments: Pt able to follow exercise prescription today without complaint.  Will continue to monitor for progression.    Dr. Emily Filbert is Medical Director for [place] and [facility].

## 2017-09-17 ENCOUNTER — Encounter: Payer: 59 | Admitting: *Deleted

## 2017-09-17 ENCOUNTER — Encounter: Payer: Self-pay | Admitting: Dietician

## 2017-09-17 VITALS — Ht 71.0 in | Wt 209.0 lb

## 2017-09-17 DIAGNOSIS — I214 Non-ST elevation (NSTEMI) myocardial infarction: Secondary | ICD-10-CM

## 2017-09-17 NOTE — Progress Notes (Signed)
Daily Session Note  Patient Details  Name: Jason Gross MRN: [id number] Date of Birth: 11/27/1955 Referring Provider:     Cardiac Rehab from 07/30/2017 in [place] Cardiac and Pulmonary Rehab  Referring Provider  [name]      Encounter Date: 09/17/2017  Check In: Session Check In - 09/17/17 1640      Check-In   Staff Present  Renita Papa, RN Moises Blood, BS, ACSM CEP, Exercise Physiologist;Amanda Oletta Darter, [place], ACSM CEP, Exercise Physiologist;Joseph Flavia Shipper    Supervising physician immediately available to respond to emergencies  See telemetry face sheet for immediately available ER MD    Medication changes reported      No    Fall or balance concerns reported     No    Warm-up and Cool-down  Performed on first and last piece of equipment    Resistance Training Performed  Yes    VAD Patient?  No      Pain Assessment   Currently in Pain?  No/denies          Social History   Tobacco Use  Smoking Status Former Smoker  . Last attempt to quit: 07/28/1968  . Years since quitting: 49.1  Smokeless Tobacco Never Used    Goals Met:  Exercise tolerated well No report of cardiac concerns or symptoms Strength training completed today  Goals Unmet:  Not Applicable  Comments:  [place] Name 07/30/17 1427 09/17/17 1726       6 Minute Walk   Phase  -  Discharge    Distance  1400 feet  1675 feet    Distance % Change  -  19 %    Distance Feet Change  -  275 ft    Walk Time  6 minutes  6 minutes    # of Rest Breaks  0  0    MPH  2.65  3.17    METS  3.77  4.3    RPE  11  14    Perceived Dyspnea   2  -    VO2 Peak  13.2  15    Symptoms  Yes (comment)  No    Comments  vertigo ( makes sure he look sahead not at floor and is fine)  -    Resting HR  53 bpm  75 bpm    Resting BP  124/68  128/58    Resting Oxygen Saturation   98 %  -    Max Ex. HR  103 bpm  120 bpm    Max Ex. BP  152/70  136/60    2 Minute Post BP  130/74  -      Pt able to follow  exercise prescription today without complaint.  Will continue to monitor for progression.Doing well with exercise prescription progression.    Dr. Emily Filbert is Medical Director for [place] and [facility].

## 2017-09-21 ENCOUNTER — Encounter: Payer: 59 | Admitting: *Deleted

## 2017-09-21 DIAGNOSIS — I214 Non-ST elevation (NSTEMI) myocardial infarction: Secondary | ICD-10-CM

## 2017-09-21 NOTE — Progress Notes (Signed)
Daily Session Note  Patient Details  Name: Jason Gross MRN: [id number] Date of Birth: [birth year] Referring Provider:     Cardiac Rehab from 07/30/2017 in [facility] Cardiac and Pulmonary Rehab  Referring Provider  [name]      Encounter Date: 09/21/2017  Check In: Session Check In - 09/21/17 1711      Check-In   Location  [facility]-Cardiac & Pulmonary Rehab    Staff Present  Nada Maclachlan, BA, ACSM CEP, Exercise Physiologist;Cherilynn Schomburg Amedeo Plenty, BS, ACSM CEP, Exercise Physiologist;Meredith Sherryll Burger, RN Geralyn Corwin, RN BSN;Susanne Bice, RN, BSN, [employer] physician immediately available to respond to emergencies  See telemetry face sheet for immediately available ER MD    Medication changes reported      No    Fall or balance concerns reported     No    Warm-up and Cool-down  Performed on first and last piece of equipment    Resistance Training Performed  Yes    VAD Patient?  No      Pain Assessment   Currently in Pain?  No/denies    Multiple Pain Sites  No          Social History   Tobacco Use  Smoking Status Former Smoker  . Last attempt to quit: 07/28/1968  . Years since quitting: 49.1  Smokeless Tobacco Never Used    Goals Met:  Independence with exercise equipment Exercise tolerated well No report of cardiac concerns or symptoms Strength training completed today  Goals Unmet:  Not Applicable  Comments: Pt able to follow exercise prescription today without complaint.  Will continue to monitor for progression.    Dr. Emily Filbert is Medical Director for [place] and [facility].

## 2017-09-22 NOTE — Patient Instructions (Signed)
Discharge Patient Instructions  Patient Details  Name: Jason Gross MRN: [id number] Date of Birth: 1955-10-10 Referring Provider:  Corey Skains, MD   Number of Visits: 36  Reason for Discharge:  Patient reached a stable level of exercise. Patient independent in their exercise. Patient has met program and personal goals.  Smoking History:  Social History   Tobacco Use  Smoking Status Former Smoker  . Last attempt to quit: 07/28/1968  . Years since quitting: 49.1  Smokeless Tobacco Never Used    Diagnosis:  NSTEMI (non-ST elevated myocardial infarction) [facility])  Initial Exercise Prescription: Initial Exercise Prescription - 07/30/17 1400      Date of Initial Exercise RX and Referring Provider   Date  07/30/17    Referring Provider  [name]      Treadmill   MPH  2.5    Grade  2    Minutes  15    METs  3.6      Recumbant Bike   Level  3    RPM  60    Watts  50    Minutes  15    METs  3.6      T5 Nustep   Level  3    SPM  80    Minutes  15    METs  3.6      Prescription Details   Frequency (times per week)  3    Duration  Progress to 45 minutes of aerobic exercise without signs/symptoms of physical distress      Intensity   THRR 40-80% of Max Heartrate  95-138    Ratings of Perceived Exertion  11-13    Perceived Dyspnea  0-4      Resistance Training   Training Prescription  Yes    Weight  4 lb    Reps  10-15       Discharge Exercise Prescription (Final Exercise Prescription Changes): Exercise Prescription Changes - 09/15/17 1100      Response to Exercise   Blood Pressure (Admit)  120/62    Blood Pressure (Exercise)  132/82    Blood Pressure (Exit)  110/70    Heart Rate (Admit)  72 bpm    Heart Rate (Exercise)  117 bpm    Heart Rate (Exit)  76 bpm    Rating of Perceived Exertion (Exercise)  14    Symptoms  none    Duration  Continue with 45 min of aerobic exercise without signs/symptoms of physical distress.    Intensity  THRR unchanged       Progression   Progression  Continue to progress workloads to maintain intensity without signs/symptoms of physical distress.    Average METs  6.37      Resistance Training   Training Prescription  Yes    Weight  4 lb    Reps  10-15      Interval Training   Interval Training  No      Treadmill   MPH  3    Grade  2.5    Minutes  15    METs  4.54      Recumbant Bike   Level  4    RPM  60    Minutes  15    METs  8.2      Home Exercise Plan   Plans to continue exercise at  Home (comment) add 1 day of walking    Frequency  Add 1 additional day to program exercise sessions.  Initial Home Exercises Provided  08/20/17       Functional Capacity: 6 Minute Walk    Row Name 07/30/17 1427 09/17/17 1726       6 Minute Walk   Phase  -  Discharge    Distance  1400 feet  1675 feet    Distance % Change  -  19 %    Distance Feet Change  -  275 ft    Walk Time  6 minutes  6 minutes    # of Rest Breaks  0  0    MPH  2.65  3.17    METS  3.77  4.3    RPE  11  14    Perceived Dyspnea   2  -    VO2 Peak  13.2  15    Symptoms  Yes (comment)  No    Comments  vertigo ( makes sure he look sahead not at floor and is fine)  -    Resting HR  53 bpm  75 bpm    Resting BP  124/68  128/58    Resting Oxygen Saturation   98 %  -    Max Ex. HR  103 bpm  120 bpm    Max Ex. BP  152/70  136/60    2 Minute Post BP  130/74  -       Quality of Life: Quality of Life - 07/30/17 1201      Quality of Life Scores   Health/Function Pre  29.14 %    Socioeconomic Pre  30 %    Psych/Spiritual Pre  30 %    Family Pre  30 %    GLOBAL Pre  29.64 %       Personal Goals: Goals established at orientation with interventions provided to work toward goal. Personal Goals and Risk Factors at Admission - 07/30/17 1327      Core Components/Risk Factors/Patient Goals on Admission    Weight Management  Yes;Weight Loss    Intervention  Weight Management: Develop a combined nutrition and exercise  program designed to reach desired caloric intake, while maintaining appropriate intake of nutrient and fiber, sodium and fats, and appropriate energy expenditure required for the weight goal.;Weight Management: Provide education and appropriate resources to help participant work on and attain dietary goals.;Weight Management/Obesity: Establish reasonable short term and long term weight goals.    Admit Weight  218 lb (98.9 kg)    Goal Weight: Short Term  214 lb (97.1 kg)    Goal Weight: Long Term  195 lb (88.5 kg)    Expected Outcomes  Short Term: Continue to assess and modify interventions until short term weight is achieved;Long Term: Adherence to nutrition and physical activity/exercise program aimed toward attainment of established weight goal;Weight Loss: Understanding of general recommendations for a balanced deficit meal plan, which promotes 1-2 lb weight loss per week and includes a negative energy balance of [phone] kcal/d;Understanding recommendations for meals to include 15-35% energy as protein, 25-35% energy from fat, 35-60% energy from carbohydrates, less than 219m of dietary cholesterol, 20-35 gm of total fiber daily;Understanding of distribution of calorie intake throughout the day with the consumption of 4-5 meals/snacks    Hypertension  Yes    Intervention  Provide education on lifestyle modifcations including regular physical activity/exercise, weight management, moderate sodium restriction and increased consumption of fresh fruit, vegetables, and low fat dairy, alcohol moderation, and smoking cessation.;Monitor prescription use compliance.    Expected Outcomes  Short Term: Continued assessment and intervention until BP is < 140/21m HG in hypertensive participants. < 130/848mHG in hypertensive participants with diabetes, heart failure or chronic kidney disease.;Long Term: Maintenance of blood pressure at goal levels.    Lipids  Yes    Intervention  Provide education and support for  participant on nutrition & aerobic/resistive exercise along with prescribed medications to achieve LDL <7037mHDL >76m76m  Expected Outcomes  Short Term: Participant states understanding of desired cholesterol values and is compliant with medications prescribed. Participant is following exercise prescription and nutrition guidelines.;Long Term: Cholesterol controlled with medications as prescribed, with individualized exercise RX and with personalized nutrition plan. Value goals: LDL < 70mg57mL > 40 mg.        Personal Goals Discharge: Goals and Risk Factor Review - 09/03/17 1623      Core Components/Risk Factors/Patient Goals Review   Personal Goals Review  Weight Management/Obesity;Hypertension;Lipids    Review  RoberLaydenhad good BP measurements in [facility].  His Dr increased his cholesterol med to 80 mg once per day.  He is meeting with RD today.    Expected Outcomes  Short - continue meds as directed by Dr, continue to attend [facility] Long - Follow instruction od MD and RD to lose weight and improve health       Exercise Goals and Review: Exercise Goals    Row Name 07/30/17 1427             Exercise Goals   Increase Physical Activity  Yes       Intervention  Provide advice, education, support and counseling about physical activity/exercise needs.;Develop an individualized exercise prescription for aerobic and resistive training based on initial evaluation findings, risk stratification, comorbidities and participant's personal goals.       Expected Outcomes  Achievement of increased cardiorespiratory fitness and enhanced flexibility, muscular endurance and strength shown through measurements of functional capacity and personal statement of participant.       Increase Strength and Stamina  Yes       Intervention  Provide advice, education, support and counseling about physical activity/exercise needs.;Develop an individualized exercise prescription for aerobic and resistive training based on  initial evaluation findings, risk stratification, comorbidities and participant's personal goals.       Expected Outcomes  Achievement of increased cardiorespiratory fitness and enhanced flexibility, muscular endurance and strength shown through measurements of functional capacity and personal statement of participant.       Able to understand and use rate of perceived exertion (RPE) scale  Yes       Intervention  Provide education and explanation on how to use RPE scale       Expected Outcomes  Short Term: Able to use RPE daily in rehab to express subjective intensity level;Long Term:  Able to use RPE to guide intensity level when exercising independently       Able to understand and use Dyspnea scale  Yes       Intervention  Provide education and explanation on how to use Dyspnea scale       Expected Outcomes  Short Term: Able to use Dyspnea scale daily in rehab to express subjective sense of shortness of breath during exertion;Long Term: Able to use Dyspnea scale to guide intensity level when exercising independently       Knowledge and understanding of Target Heart Rate Range (THRR)  Yes       Intervention  Provide education and explanation of THRR including  how the numbers were predicted and where they are located for reference       Expected Outcomes  Short Term: Able to state/look up THRR;Long Term: Able to use THRR to govern intensity when exercising independently;Short Term: Able to use daily as guideline for intensity in rehab       Able to check pulse independently  Yes       Intervention  Provide education and demonstration on how to check pulse in carotid and radial arteries.;Review the importance of being able to check your own pulse for safety during independent exercise       Expected Outcomes  Short Term: Able to explain why pulse checking is important during independent exercise;Long Term: Able to check pulse independently and accurately       Understanding of Exercise Prescription   Yes       Intervention  Provide education, explanation, and written materials on patient's individual exercise prescription       Expected Outcomes  Short Term: Able to explain program exercise prescription;Long Term: Able to explain home exercise prescription to exercise independently          Nutrition & Weight - Outcomes: Pre Biometrics - 07/30/17 1425      Pre Biometrics   Height  _0  (1.803 m)    Weight  208 lb 3.2 oz (94.4 kg)    Waist Circumference  44 inches    Hip Circumference  42.25 inches    Waist to Hip Ratio  1.04 %    BMI (Calculated)  29.05    Single Leg Stand  30 seconds      Post Biometrics - 09/17/17 1728       Post  Biometrics   Height  _1  (1.803 m)    Weight  209 lb (94.8 kg)    Waist Circumference  44 inches    Hip Circumference  44 inches    Waist to Hip Ratio  1 %    BMI (Calculated)  29.16    Single Leg Stand  30 seconds       Nutrition: Nutrition Therapy & Goals - 09/17/17 1800      Nutrition Therapy   Diet  TLC    Drug/Food Interactions  Statins/Certain Fruits    Protein (specify units)  8-9oz    Fiber  20 grams    Whole Grain Foods  3 servings    Saturated Fats  15 max. grams    Fruits and Vegetables  5 servings/day    Sodium  1500 grams      Personal Nutrition Goals   Nutrition Goal  Continue working to make healthy food choices (high fiber, low fat/ saturated fat, low sodium)    Comments  Mr. Vandermeulen has made significant diet changes to reduce unhealthy fats, sodium, and calories. Encouraged adding more vegetables and fruits, which he will do at home, but will be difficult at work-- eating space not conducive for more than sandwiches.        Nutrition Discharge: Nutrition Assessments - 07/30/17 1330      MEDFICTS Scores   Pre Score  -- will bring Medficts to first day of class       Education Questionnaire Score: Knowledge Questionnaire Score - 07/30/17 1335      Knowledge Questionnaire Score   Pre Score  23/28 correct  answers reviewed with Herbie Baltimore       Goals reviewed with patient; copy given to patient.

## 2017-09-23 ENCOUNTER — Encounter: Payer: 59 | Admitting: *Deleted

## 2017-09-23 ENCOUNTER — Encounter: Payer: Self-pay | Admitting: *Deleted

## 2017-09-23 DIAGNOSIS — I214 Non-ST elevation (NSTEMI) myocardial infarction: Secondary | ICD-10-CM

## 2017-09-23 NOTE — Progress Notes (Deleted)
Cardiac Individual Treatment Plan  Patient Details  Name: Jason Gross MRN: [id number] Date of Birth: [birth year] Referring Provider:     Cardiac Rehab from 07/30/2017 in [facility] Cardiac and Pulmonary Rehab  Referring Provider  [name]      Initial Encounter Date:    Cardiac Rehab from 07/30/2017 in [facility] Cardiac and Pulmonary Rehab  Date  07/30/17  Referring Provider  [name]      Visit Diagnosis: NSTEMI (non-ST elevated myocardial infarction) [facility])  Patient's Home Medications on Admission:  Current Outpatient Medications:  .  aspirin EC 81 MG EC tablet, Take 1 tablet (81 mg total) by mouth daily., Disp: 30 tablet, Rfl: 0 .  atorvastatin (LIPITOR) 40 MG tablet, Take 1 tablet (40 mg total) by mouth daily at 6 PM., Disp: 30 tablet, Rfl: 0 .  cetirizine (ZYRTEC) 10 MG tablet, Take 10 mg by mouth daily.  , Disp: , Rfl:  .  clopidogrel (PLAVIX) 75 MG tablet, Take 1 tablet (75 mg total) by mouth daily., Disp: 30 tablet, Rfl: 0 .  guaiFENesin-codeine 100-10 MG/5ML syrup, Take 10 mLs by mouth every 4 (four) hours as needed for cough., Disp: 120 mL, Rfl: 0 .  HYDROcodone-acetaminophen (NORCO) 5-325 MG tablet, Take 1 tablet by mouth every 12 (twelve) hours as needed for severe pain. (Patient not taking: Reported on 07/30/2017), Disp: 10 tablet, Rfl: 0 .  ibuprofen (ADVIL,MOTRIN) 400 MG tablet, Take 1 tablet (400 mg total) by mouth every 6 (six) hours as needed., Disp: 30 tablet, Rfl: 0 .  lisinopril (PRINIVIL,ZESTRIL) 5 MG tablet, Take 0.5 tablets (2.5 mg total) by mouth daily., Disp: 30 tablet, Rfl: 0 .  metoprolol tartrate (LOPRESSOR) 25 MG tablet, Take 1 tablet (25 mg total) by mouth 2 (two) times daily., Disp: 60 tablet, Rfl: 0  Past Medical History: Past Medical History:  Diagnosis Date  . Allergy   . Benign mass of parotid gland   . GERD (gastroesophageal reflux disease)   . Hyperlipidemia    not on meds  . Hypertension   . Vertigo     Tobacco Use: Social History   Tobacco Use    Smoking Status Former Smoker  . Last attempt to quit: 07/28/1968  . Years since quitting: 49.1  Smokeless Tobacco Never Used    Labs: Recent Chemical engineer    Labs for ITP Cardiac and Pulmonary Rehab Latest Ref Rng & Units 06/30/2017 06/30/2017 06/30/2017 06/30/2017 07/03/2017   Cholestrol 0 - 200 mg/dL - 153 - - -   LDLCALC 0 - 99 mg/dL - 91 - - -   HDL >40 mg/dL - 28(L) - - -   Trlycerides <150 mg/dL - 170(H) 168(H) - -   Hemoglobin A1c 4.8 - 5.6 % - 5.6 - - -   PHART 7.350 - 7.450 7.28(L) - - 7.25(L) 7.41   PCO2ART 32.0 - 48.0 mmHg 43 - - 54(H) 52(H)   HCO3 20.0 - 28.0 mmol/L 20.2 - - 23.1 33.0(H)   ACIDBASEDEF 0.0 - 2.0 mmol/L 6.4(H) - - 5.0(H) -   O2SAT % 96.0 - - 99.8 97.1       Exercise Target Goals:    Exercise Program Goal: Individual exercise prescription set using results from initial 6 min walk test and THRR while considering  patient's activity barriers and safety.   Exercise Prescription Goal: Initial exercise prescription builds to 30-45 minutes a day of aerobic activity, 2-3 days per week.  Home exercise guidelines will be given to patient during program as  part of exercise prescription that the participant will acknowledge.  Activity Barriers & Risk Stratification: Activity Barriers & Cardiac Risk Stratification - 07/30/17 1337      Activity Barriers & Cardiac Risk Stratification   Activity Barriers  Other (comment)    Comments  pain from 4 broken ribs related to CPR    Cardiac Risk Stratification  Moderate       6 Minute Walk: 6 Minute Walk    Row Name 07/30/17 1427 09/17/17 1726       6 Minute Walk   Phase  -  Discharge    Distance  1400 feet  1675 feet    Distance % Change  -  19 %    Distance Feet Change  -  275 ft    Walk Time  6 minutes  6 minutes    # of Rest Breaks  0  0    MPH  2.65  3.17    METS  3.77  4.3    RPE  11  14    Perceived Dyspnea   2  -    VO2 Peak  13.2  15    Symptoms  Yes (comment)  No    Comments  vertigo ( makes  sure he look sahead not at floor and is fine)  -    Resting HR  53 bpm  75 bpm    Resting BP  124/68  128/58    Resting Oxygen Saturation   98 %  -    Max Ex. HR  103 bpm  120 bpm    Max Ex. BP  152/70  136/60    2 Minute Post BP  130/74  -       Oxygen Initial Assessment:   Oxygen Re-Evaluation:   Oxygen Discharge (Final Oxygen Re-Evaluation):   Initial Exercise Prescription: Initial Exercise Prescription - 07/30/17 1400      Date of Initial Exercise RX and Referring Provider   Date  07/30/17    Referring Provider  [name]      Treadmill   MPH  2.5    Grade  2    Minutes  15    METs  3.6      Recumbant Bike   Level  3    RPM  60    Watts  50    Minutes  15    METs  3.6      T5 Nustep   Level  3    SPM  80    Minutes  15    METs  3.6      Prescription Details   Frequency (times per week)  3    Duration  Progress to 45 minutes of aerobic exercise without signs/symptoms of physical distress      Intensity   THRR 40-80% of Max Heartrate  95-138    Ratings of Perceived Exertion  11-13    Perceived Dyspnea  0-4      Resistance Training   Training Prescription  Yes    Weight  4 lb    Reps  10-15       Perform Capillary Blood Glucose checks as needed.  Exercise Prescription Changes: Exercise Prescription Changes    Row Name 07/30/17 1400 08/05/17 1200 08/18/17 1500 08/20/17 1600 09/02/17 1200     Response to Exercise   Blood Pressure (Admit)  124/68  142/72  118/68  -  110/60   Blood Pressure (Exercise)  152/70  158/84  156/72  -  176/76   Blood Pressure (Exit)  130/74  112/78  -  -  -   Heart Rate (Admit)  54 bpm  71 bpm  63 bpm  -  60 bpm   Heart Rate (Exercise)  103 bpm  123 bpm  114 bpm  -  118 bpm   Heart Rate (Exit)  103 bpm  96 bpm  73 bpm  -  74 bpm   Oxygen Saturation (Admit)  98 %  -  -  -  -   Rating of Perceived Exertion (Exercise)  _0 -  15   Symptoms  -  none  none  -  none   Duration  -  Progress to 45 minutes of aerobic  exercise without signs/symptoms of physical distress  Progress to 45 minutes of aerobic exercise without signs/symptoms of physical distress  -  Progress to 45 minutes of aerobic exercise without signs/symptoms of physical distress   Intensity  -  THRR unchanged  THRR unchanged  -  THRR unchanged     Progression   Progression  -  Continue to progress workloads to maintain intensity without signs/symptoms of physical distress.  Continue to progress workloads to maintain intensity without signs/symptoms of physical distress.  -  Continue to progress workloads to maintain intensity without signs/symptoms of physical distress.   Average METs  -  3.65  3.67  -  3.4     Resistance Training   Training Prescription  -  Yes  Yes  -  Yes   Weight  -  4 lb  4 lb  -  4 lb   Reps  -  10-15  10-15  -  10-15     Interval Training   Interval Training  -  -  -  -  No     Treadmill   MPH  -  2.5  -  -  3   Grade  -  2  -  -  2.5   Minutes  -  15  -  -  15   METs  -  3.6  -  -  4.54     Recumbant Bike   Level  -  3  4  -  -   RPM  -  60  60  -  -   Watts  -  50  50  -  -   Minutes  -  15  15  -  -   METs  -  3.71  3.7  -  -     T5 Nustep   Level  -  -  -  -  5   SPM  -  -  -  -  80   Minutes  -  -  -  -  15   METs  -  -  -  -  2.8     Home Exercise Plan   Plans to continue exercise at  -  -  -  Home (comment) add 1 day of walking  Home (comment) add 1 day of walking   Frequency  -  -  -  Add 1 additional day to program exercise sessions.  Add 1 additional day to program exercise sessions.   Initial Home Exercises Provided  -  -  -  08/20/17  08/20/17   Row Name 09/15/17 1100             Response  to Exercise   Blood Pressure (Admit)  120/62       Blood Pressure (Exercise)  132/82       Blood Pressure (Exit)  110/70       Heart Rate (Admit)  72 bpm       Heart Rate (Exercise)  117 bpm       Heart Rate (Exit)  76 bpm       Rating of Perceived Exertion (Exercise)  14       Symptoms  none        Duration  Continue with 45 min of aerobic exercise without signs/symptoms of physical distress.       Intensity  THRR unchanged         Progression   Progression  Continue to progress workloads to maintain intensity without signs/symptoms of physical distress.       Average METs  6.37         Resistance Training   Training Prescription  Yes       Weight  4 lb       Reps  10-15         Interval Training   Interval Training  No         Treadmill   MPH  3       Grade  2.5       Minutes  15       METs  4.54         Recumbant Bike   Level  4       RPM  60       Minutes  15       METs  8.2         Home Exercise Plan   Plans to continue exercise at  Home (comment) add 1 day of walking       Frequency  Add 1 additional day to program exercise sessions.       Initial Home Exercises Provided  08/20/17          Exercise Comments: Exercise Comments    Row Name 08/03/17 1731 09/23/17 1712         Exercise Comments   First full day of exercise!  Patient was oriented to gym and equipment including functions, settings, policies, and procedures.  Patient's individual exercise prescription and treatment plan were reviewed.  All starting workloads were established based on the results of the 6 minute walk test done at initial orientation visit.  The plan for exercise progression was also introduced and progression will be customized based on patient's performance and goals.  Jason Gross graduated today from  rehab with 36 sessions completed.  Details of the patient's exercise prescription and what He needs to do in order to continue the prescription and progress were discussed with patient.  Patient was given a copy of prescription and goals.  Patient verbalized understanding.  Jason Gross plans to continue to exercise by continuing to exercise at home.         Exercise Goals and Review: Exercise Goals    Row Name 07/30/17 1427             Exercise Goals   Increase Physical Activity   Yes       Intervention  Provide advice, education, support and counseling about physical activity/exercise needs.;Develop an individualized exercise prescription for aerobic and resistive training based on initial evaluation findings, risk stratification, comorbidities and participant's personal goals.       Expected Outcomes  Achievement of increased cardiorespiratory fitness and enhanced flexibility, muscular endurance and strength shown through measurements of functional capacity and personal statement of participant.       Increase Strength and Stamina  Yes       Intervention  Provide advice, education, support and counseling about physical activity/exercise needs.;Develop an individualized exercise prescription for aerobic and resistive training based on initial evaluation findings, risk stratification, comorbidities and participant's personal goals.       Expected Outcomes  Achievement of increased cardiorespiratory fitness and enhanced flexibility, muscular endurance and strength shown through measurements of functional capacity and personal statement of participant.       Able to understand and use rate of perceived exertion (RPE) scale  Yes       Intervention  Provide education and explanation on how to use RPE scale       Expected Outcomes  Short Term: Able to use RPE daily in rehab to express subjective intensity level;Long Term:  Able to use RPE to guide intensity level when exercising independently       Able to understand and use Dyspnea scale  Yes       Intervention  Provide education and explanation on how to use Dyspnea scale       Expected Outcomes  Short Term: Able to use Dyspnea scale daily in rehab to express subjective sense of shortness of breath during exertion;Long Term: Able to use Dyspnea scale to guide intensity level when exercising independently       Knowledge and understanding of Target Heart Rate Range (THRR)  Yes       Intervention  Provide education and explanation of  THRR including how the numbers were predicted and where they are located for reference       Expected Outcomes  Short Term: Able to state/look up THRR;Long Term: Able to use THRR to govern intensity when exercising independently;Short Term: Able to use daily as guideline for intensity in rehab       Able to check pulse independently  Yes       Intervention  Provide education and demonstration on how to check pulse in carotid and radial arteries.;Review the importance of being able to check your own pulse for safety during independent exercise       Expected Outcomes  Short Term: Able to explain why pulse checking is important during independent exercise;Long Term: Able to check pulse independently and accurately       Understanding of Exercise Prescription  Yes       Intervention  Provide education, explanation, and written materials on patient's individual exercise prescription       Expected Outcomes  Short Term: Able to explain program exercise prescription;Long Term: Able to explain home exercise prescription to exercise independently          Exercise Goals Re-Evaluation : Exercise Goals Re-Evaluation    Row Name 08/18/17 1521 08/20/17 1644 09/02/17 1221 09/15/17 1129       Exercise Goal Re-Evaluation   Exercise Goals Review  Increase Physical Activity;Able to understand and use rate of perceived exertion (RPE) scale;Increase Strength and Stamina  Increase Physical Activity;Increase Strength and Stamina;Able to check pulse independently  Increase Physical Activity;Able to understand and use rate of perceived exertion (RPE) scale;Increase Strength and Stamina  Increase Physical Activity;Able to understand and use rate of perceived exertion (RPE) scale;Understanding of Exercise Prescription    Comments  Jason Gross is tolerating exercise well. Staff will continue to monitor.  Reviewed home exercise with  pt today.  Pt plans to walk at home for exercise.  Reviewed THR, pulse, RPE, sign and symptoms,  NTG use, and when to call 911 or MD.  Also discussed weather considerations and indoor options.  Pt voiced understanding. Practice checking pulse   Jason Gross is progressing well with exercise.  He has increased TM speed and grade and resistance on T5.  Staff will progress Jason Gross to interval training.  Jason Gross has increased average METS to over 6 during class.  Staff wil continue to monitor    Expected Outcomes  Short - Jason Gross will increase levels on all machines.  Long - Jason Gross will improve overall MET level  Short: add 1 day of walking at home, practice checking his pulse. Long: add more days to home exercise, check pulse independently  Short - Jason Gross will add interval training to his program  Long - Jason Gross will continue to improve MET levels  SHort - Jason Gross will finish HT program Long - Jason Gross will continue to exercise on his own       Discharge Exercise Prescription (Final Exercise Prescription Changes): Exercise Prescription Changes - 09/15/17 1100      Response to Exercise   Blood Pressure (Admit)  120/62    Blood Pressure (Exercise)  132/82    Blood Pressure (Exit)  110/70    Heart Rate (Admit)  72 bpm    Heart Rate (Exercise)  117 bpm    Heart Rate (Exit)  76 bpm    Rating of Perceived Exertion (Exercise)  14    Symptoms  none    Duration  Continue with 45 min of aerobic exercise without signs/symptoms of physical distress.    Intensity  THRR unchanged      Progression   Progression  Continue to progress workloads to maintain intensity without signs/symptoms of physical distress.    Average METs  6.37      Resistance Training   Training Prescription  Yes    Weight  4 lb    Reps  10-15      Interval Training   Interval Training  No      Treadmill   MPH  3    Grade  2.5    Minutes  15    METs  4.54      Recumbant Bike   Level  4    RPM  60    Minutes  15    METs  8.2      Home Exercise Plan   Plans to continue exercise at  Home (comment) add 1 day of walking     Frequency  Add 1 additional day to program exercise sessions.    Initial Home Exercises Provided  08/20/17       Nutrition:  Target Goals: Understanding of nutrition guidelines, daily intake of sodium <1542m, cholesterol <2069m calories 30% from fat and 7% or less from saturated fats, daily to have 5 or more servings of fruits and vegetables.  Biometrics: Pre Biometrics - 07/30/17 1425      Pre Biometrics   Height  _0  (1.803 m)    Weight  208 lb 3.2 oz (94.4 kg)    Waist Circumference  44 inches    Hip Circumference  42.25 inches    Waist to Hip Ratio  1.04 %    BMI (Calculated)  29.05    Single Leg Stand  30 seconds      Post Biometrics - 09/17/17 1728       Post  Biometrics   Height  _0  (1.803 m)    Weight  209 lb (94.8 kg)    Waist Circumference  44 inches    Hip Circumference  44 inches    Waist to Hip Ratio  1 %    BMI (Calculated)  29.16    Single Leg Stand  30 seconds       Nutrition Therapy Plan and Nutrition Goals: Nutrition Therapy & Goals - 09/17/17 1800      Nutrition Therapy   Diet  TLC    Drug/Food Interactions  Statins/Certain Fruits    Protein (specify units)  8-9oz    Fiber  20 grams    Whole Grain Foods  3 servings    Saturated Fats  15 max. grams    Fruits and Vegetables  5 servings/day    Sodium  1500 grams      Personal Nutrition Goals   Nutrition Goal  Continue working to make healthy food choices (high fiber, low fat/ saturated fat, low sodium)    Comments  Jason Gross has made significant diet changes to reduce unhealthy fats, sodium, and calories. Encouraged adding more vegetables and fruits, which he will do at home, but will be difficult at work-- eating space not conducive for more than sandwiches.        Nutrition Assessments: Nutrition Assessments - 09/23/17 1740      MEDFICTS Scores   Post Score  15       Nutrition Goals Re-Evaluation:   Nutrition Goals Discharge (Final Nutrition Goals  Re-Evaluation):   Psychosocial: Target Goals: Acknowledge presence or absence of significant depression and/or stress, maximize coping skills, provide positive support system. Participant is able to verbalize types and ability to use techniques and skills needed for reducing stress and depression.   Initial Review & Psychosocial Screening: Initial Psych Review & Screening - 07/30/17 1330      Initial Review   Current issues with  Current Sleep Concerns pain from broken ribs related to CPR, but getting better      Jason Gross?  Yes Wife and family      Screening Interventions   Interventions  Yes;Encouraged to exercise;Program counselor consult    Expected Outcomes  Short Term goal: Utilizing psychosocial counselor, staff and physician to assist with identification of specific Stressors or current issues interfering with healing process. Setting desired goal for each stressor or current issue identified.;Short Term goal: Identification and review with participant of any Quality of Life or Depression concerns found by scoring the questionnaire.;Long Term Goal: Stressors or current issues are controlled or eliminated.;Long Term goal: The participant improves quality of Life and PHQ9 Scores as seen by post scores and/or verbalization of changes       Quality of Life Scores:  Quality of Life - 09/23/17 1740      Quality of Life Scores   Health/Function Post  21 %    Socioeconomic Post  21 %    Psych/Spiritual Post  21 %    Family Pre  21 %    GLOBAL Pre  21 %      Scores of 19 and below usually indicate a poorer quality of life in these areas.  A difference of  2-3 points is a clinically meaningful difference.  A difference of 2-3 points in the total score of the Quality of Life Index has been associated with significant improvement in overall quality of life, self-image, physical symptoms, and general health  in studies assessing change in quality of  life.  PHQ-9: Recent Review Flowsheet Data    Depression screen [facility] 2/9 07/30/2017 03/12/2017   Decreased Interest 0 0   Down, Depressed, Hopeless 0 0   PHQ - 2 Score 0 0   Altered sleeping 1  -   Tired, decreased energy 0 -   Change in appetite 0 -   Feeling bad or failure about yourself  0 -   Trouble concentrating 0 -   Moving slowly or fidgety/restless 0 -   Suicidal thoughts 0 -   PHQ-9 Score 1 -   Difficult doing work/chores Not difficult at all -     Interpretation of Total Score  Total Score Depression Severity:  1-4 = Minimal depression, 5-9 = Mild depression, 10-14 = Moderate depression, 15-19 = Moderately severe depression, 20-27 = Severe depression   Psychosocial Evaluation and Intervention: Psychosocial Evaluation - 08/03/17 1738      Psychosocial Evaluation & Interventions   Interventions  Encouraged to exercise with the program and follow exercise prescription;Stress management education    Comments  Counselor met with Jason Gross) today for initial psychosocial evaluation.  He is a 62 year old who had a heart attack on 12/7.  Jason Gross has a strong support system with a spouse of 20 years; a son locally and parents who live close by.  Josten reports being in good health overall with the exception of vertigo and his heart condition.  He sleeps well and has a good appetite.  Jason Gross denies a history of depression or anxiety or any current symptoms and states he is typically in a positive mood.  Jason Gross has some stress in his life with a mother who has dementia and also not being able to return to work due to the heavy lifting limits currently.  This is stressful financially but he reports his spouse is working and they are able to make ends meet.  Jason Gross has goals to get stronger and get back to work as soon as possible.  Staff will follow with Jason Gross throughout the course of this program.      Expected Outcomes  Jason Gross will benefit with consistent exercise to achieve his  stated goals.  The educational and psychoeducational aspects of this program will be helpful in understanding and coping more positively with his condition.  Staff will follow with Jason Gross.      Continue Psychosocial Services   Follow up required by staff       Psychosocial Re-Evaluation: Psychosocial Re-Evaluation    [place] Name 09/03/17 1628             Psychosocial Re-Evaluation   Current issues with  None Identified       Interventions  Encouraged to attend Cardiac Rehabilitation for the exercise       Continue Psychosocial Services   Follow up required by staff          Psychosocial Discharge (Final Psychosocial Re-Evaluation): Psychosocial Re-Evaluation - 09/03/17 1628      Psychosocial Re-Evaluation   Current issues with  None Identified    Interventions  Encouraged to attend Cardiac Rehabilitation for the exercise    Continue Psychosocial Services   Follow up required by staff       Vocational Rehabilitation: Provide vocational rehab assistance to qualifying candidates.   Vocational Rehab Evaluation & Intervention: Vocational Rehab - 07/30/17 1336      Initial Vocational Rehab Evaluation & Intervention   Assessment shows need for Vocational  Rehabilitation  No       Education: Education Goals: Education classes will be provided on a variety of topics geared toward better understanding of heart health and risk factor modification. Participant will state understanding/return demonstration of topics presented as noted by education test scores.  Learning Barriers/Preferences: Learning Barriers/Preferences - 07/30/17 1334      Learning Barriers/Preferences   Learning Barriers  None    Learning Preferences  None       Education Topics:  AED/CPR: - Group verbal and written instruction with the use of models to demonstrate the basic use of the AED with the basic ABC's of resuscitation.   Cardiac Rehab from 09/23/2017 in [facility] Cardiac and Pulmonary Rehab  Date   09/07/17  Educator  MA  Instruction Review Code  1- Verbalizes Understanding      General Nutrition Guidelines/Fats and Fiber: -Group instruction provided by verbal, written material, models and posters to present the general guidelines for heart healthy nutrition. Gives an explanation and review of dietary fats and fiber.   Cardiac Rehab from 09/23/2017 in [facility] Cardiac and Pulmonary Rehab  Date  08/24/17  Educator  PI  Instruction Review Code  1- Verbalizes Understanding      Controlling Sodium/Reading Food Labels: -Group verbal and written material supporting the discussion of sodium use in heart healthy nutrition. Review and explanation with models, verbal and written materials for utilization of the food label.   Cardiac Rehab from 09/23/2017 in [facility] Cardiac and Pulmonary Rehab  Date  08/31/17  Educator  PI  Instruction Review Code  1- Verbalizes Understanding      Exercise Physiology & General Exercise Guidelines: - Group verbal and written instruction with models to review the exercise physiology of the cardiovascular system and associated critical values. Provides general exercise guidelines with specific guidelines to those with heart or lung disease.    Cardiac Rehab from 09/23/2017 in [facility] Cardiac and Pulmonary Rehab  Date  09/14/17  Educator  [facility]  Instruction Review Code  1- Verbalizes Understanding      Aerobic Exercise & Resistance Training: - Gives group verbal and written instruction on the various components of exercise. Focuses on aerobic and resistive training programs and the benefits of this training and how to safely progress through these programs..   Cardiac Rehab from 09/23/2017 in [facility] Cardiac and Pulmonary Rehab  Date  09/21/17  Educator  AS  Instruction Review Code  1- Verbalizes Understanding      Flexibility, Balance, Mind/Body Relaxation: Provides group verbal/written instruction on the benefits of flexibility and balance training, including  mind/body exercise modes such as yoga, pilates and tai chi.  Demonstration and skill practice provided.   Cardiac Rehab from 09/23/2017 in [facility] Cardiac and Pulmonary Rehab  Date  09/23/17  Educator  AS  Instruction Review Code  1- Verbalizes Understanding      Stress and Anxiety: - Provides group verbal and written instruction about the health risks of elevated stress and causes of high stress.  Discuss the correlation between heart/lung disease and anxiety and treatment options. Review healthy ways to manage with stress and anxiety.   Cardiac Rehab from 09/23/2017 in [facility] Cardiac and Pulmonary Rehab  Date  08/19/17  Educator  [facility]  Instruction Review Code  1- Verbalizes Understanding      Depression: - Provides group verbal and written instruction on the correlation between heart/lung disease and depressed mood, treatment options, and the stigmas associated with seeking treatment.   Cardiac Rehab from 09/23/2017  in [facility] Cardiac and Pulmonary Rehab  Date  09/16/17  Educator  [facility]  Instruction Review Code  1- Verbalizes Understanding      Anatomy & Physiology of the Heart: - Group verbal and written instruction and models provide basic cardiac anatomy and physiology, with the coronary electrical and arterial systems. Review of Valvular disease and Heart Failure   Cardiac Rehab from 09/23/2017 in [facility] Cardiac and Pulmonary Rehab  Date  08/17/17  Educator  CE  Instruction Review Code  1- Verbalizes Understanding      Cardiac Procedures: - Group verbal and written instruction to review commonly prescribed medications for heart disease. Reviews the medication, class of the drug, and side effects. Includes the steps to properly store meds and maintain the prescription regimen. (beta blockers and nitrates)   Cardiac Rehab from 09/23/2017 in [facility] Cardiac and Pulmonary Rehab  Date  08/26/17  Educator  [facility]  Instruction Review Code  1- Verbalizes Understanding      Cardiac Medications I: -  Group verbal and written instruction to review commonly prescribed medications for heart disease. Reviews the medication, class of the drug, and side effects. Includes the steps to properly store meds and maintain the prescription regimen.   Cardiac Rehab from 09/23/2017 in [facility] Cardiac and Pulmonary Rehab  Date  08/10/17 [Part 2 08/12/17 CE]  Educator  [facility]  Instruction Review Code  1- Verbalizes Understanding      Cardiac Medications II: -Group verbal and written instruction to review commonly prescribed medications for heart disease. Reviews the medication, class of the drug, and side effects. (all other drug classes)   Cardiac Rehab from 09/23/2017 in [facility] Cardiac and Pulmonary Rehab  Date  08/05/17 [risk actors]  Educator  [facility]  Instruction Review Code  1- [employer] Understanding       Go Sex-Intimacy & Heart Disease, Get SMART - Goal Setting: - Group verbal and written instruction through game format to discuss heart disease and the return to sexual intimacy. Provides group verbal and written material to discuss and apply goal setting through the application of the S.M.A.R.T. Method.   Cardiac Rehab from 09/23/2017 in [facility] Cardiac and Pulmonary Rehab  Date  08/26/17  Educator  [facility]  Instruction Review Code  1- Verbalizes Understanding      Other Matters of the Heart: - Provides group verbal, written materials and models to describe Stable Angina and Peripheral Artery. Includes description of the disease process and treatment options available to the cardiac patient.   Exercise & Equipment Safety: - Individual verbal instruction and demonstration of equipment use and safety with use of the equipment.   Cardiac Rehab from 09/23/2017 in [facility] Cardiac and Pulmonary Rehab  Date  07/30/17  Educator  [facility]  Instruction Review Code  1- Verbalizes Understanding      Infection Prevention: - Provides verbal and written material to individual with discussion of infection control including proper  hand washing and proper equipment cleaning during exercise session.   Cardiac Rehab from 09/23/2017 in [facility] Cardiac and Pulmonary Rehab  Date  07/30/17  Educator  [facility]  Instruction Review Code  1- Verbalizes Understanding      Falls Prevention: - Provides verbal and written material to individual with discussion of falls prevention and safety.   Cardiac Rehab from 09/23/2017 in [facility] Cardiac and Pulmonary Rehab  Date  07/30/17  Educator  [facility]  Instruction Review Code  1- Verbalizes Understanding      Diabetes: - Individual verbal and written instruction to  review signs/symptoms of diabetes, desired ranges of glucose level fasting, after meals and with exercise. Acknowledge that pre and post exercise glucose checks will be done for 3 sessions at entry of program.   Know Your Numbers and Risk Factors: -Group verbal and written instruction about important numbers in your health.  Discussion of what are risk factors and how they play a role in the disease process.  Review of Cholesterol, Blood Pressure, Diabetes, and BMI and the role they play in your overall health.   Cardiac Rehab from 09/23/2017 in [facility] Cardiac and Pulmonary Rehab  Date  08/05/17 Jeanmarie Plant actors]  Educator  [facility]  Instruction Review Code  1- [employer] Understanding      Sleep Hygiene: -Provides group verbal and written instruction about how sleep can affect your health.  Define sleep hygiene, discuss sleep cycles and impact of sleep habits. Review good sleep hygiene tips.    Cardiac Rehab from 09/23/2017 in [facility] Cardiac and Pulmonary Rehab  Date  09/02/17  Educator  [facility]  Instruction Review Code  1- Verbalizes Understanding      Other: -Provides group and verbal instruction on various topics (see comments)   Knowledge Questionnaire Score: Knowledge Questionnaire Score - 09/23/17 1742      Knowledge Questionnaire Score   Post Score  28/28       Core Components/Risk Factors/Patient Goals at Admission: Personal Goals  and Risk Factors at Admission - 07/30/17 1327      Core Components/Risk Factors/Patient Goals on Admission    Weight Management  Yes;Weight Loss    Intervention  Weight Management: Develop a combined nutrition and exercise program designed to reach desired caloric intake, while maintaining appropriate intake of nutrient and fiber, sodium and fats, and appropriate energy expenditure required for the weight goal.;Weight Management: Provide education and appropriate resources to help participant work on and attain dietary goals.;Weight Management/Obesity: Establish reasonable short term and long term weight goals.    Admit Weight  218 lb (98.9 kg)    Goal Weight: Short Term  214 lb (97.1 kg)    Goal Weight: Long Term  195 lb (88.5 kg)    Expected Outcomes  Short Term: Continue to assess and modify interventions until short term weight is achieved;Long Term: Adherence to nutrition and physical activity/exercise program aimed toward attainment of established weight goal;Weight Loss: Understanding of general recommendations for a balanced deficit meal plan, which promotes 1-2 lb weight loss per week and includes a negative energy balance of [phone] kcal/d;Understanding recommendations for meals to include 15-35% energy as protein, 25-35% energy from fat, 35-60% energy from carbohydrates, less than 278m of dietary cholesterol, 20-35 gm of total fiber daily;Understanding of distribution of calorie intake throughout the day with the consumption of 4-5 meals/snacks    Hypertension  Yes    Intervention  Provide education on lifestyle modifcations including regular physical activity/exercise, weight management, moderate sodium restriction and increased consumption of fresh fruit, vegetables, and low fat dairy, alcohol moderation, and smoking cessation.;Monitor prescription use compliance.    Expected Outcomes  Short Term: Continued assessment and intervention until BP is < 140/923mHG in hypertensive participants.  < 130/8013mG in hypertensive participants with diabetes, heart failure or chronic kidney disease.;Long Term: Maintenance of blood pressure at goal levels.    Lipids  Yes    Intervention  Provide education and support for participant on nutrition & aerobic/resistive exercise along with prescribed medications to achieve LDL <36m55mDL >40mg51m Expected Outcomes  Short Term: Participant  states understanding of desired cholesterol values and is compliant with medications prescribed. Participant is following exercise prescription and nutrition guidelines.;Long Term: Cholesterol controlled with medications as prescribed, with individualized exercise RX and with personalized nutrition plan. Value goals: LDL < 58m, HDL > 40 mg.       Core Components/Risk Factors/Patient Goals Review:  Goals and Risk Factor Review    Row Name 09/03/17 1623             Core Components/Risk Factors/Patient Goals Review   Personal Goals Review  Weight Management/Obesity;Hypertension;Lipids       Review  Jason Gross had good BP measurements in HT.  His Dr increased his cholesterol med to 80 mg once per day.  He is meeting with RD today.       Expected Outcomes  Short - continue meds as directed by Dr, continue to attend HT Long - Follow instruction od MD and RD to lose weight and improve health          Core Components/Risk Factors/Patient Goals at Discharge (Final Review):  Goals and Risk Factor Review - 09/03/17 1623      Core Components/Risk Factors/Patient Goals Review   Personal Goals Review  Weight Management/Obesity;Hypertension;Lipids    Review  Jason Gross had good BP measurements in HT.  His Dr increased his cholesterol med to 80 mg once per day.  He is meeting with RD today.    Expected Outcomes  Short - continue meds as directed by Dr, continue to attend HT Long - Follow instruction od MD and RD to lose weight and improve health       ITP Comments: ITP Comments    Row Name 07/30/17 1324 08/26/17  0635 09/23/17 0559       ITP Comments  Med review completed. Initial ITP created. [facility]  30 Day review. Continue with ITP unless directed changes per Medical Director review.  New to program  30 day review. Continue with ITP unless directed changes per Medical Director review.        Comments: Discharge ITP

## 2017-09-23 NOTE — Progress Notes (Deleted)
Discharge Progress Report  Patient Details  Name: Jason Gross MRN: [id number] Date of Birth: 03-07-1956 Referring Provider:     Cardiac Rehab from 07/30/2017 in [facility] Cardiac and Pulmonary Rehab  Referring Provider  [name]       Number of Visits: 16  Reason for Discharge:  Patient reached a stable level of exercise. Patient independent in their exercise. Patient has met program and personal goals.  Smoking History:  Social History   Tobacco Use  Smoking Status Former Smoker  . Last attempt to quit: 07/28/1968  . Years since quitting: 49.1  Smokeless Tobacco Never Used    Diagnosis:  NSTEMI (non-ST elevated myocardial infarction) ([place])  ADL [facility]:   Initial Exercise Prescription: Initial Exercise Prescription - 07/30/17 1400      Date of Initial Exercise RX and Referring Provider   Date  07/30/17    Referring Provider  [name]      Treadmill   MPH  2.5    Grade  2    Minutes  15    METs  3.6      Recumbant Bike   Level  3    RPM  60    Watts  50    Minutes  15    METs  3.6      T5 Nustep   Level  3    SPM  80    Minutes  15    METs  3.6      Prescription Details   Frequency (times per week)  3    Duration  Progress to 45 minutes of aerobic exercise without signs/symptoms of physical distress      Intensity   THRR 40-80% of Max Heartrate  95-138    Ratings of Perceived Exertion  11-13    Perceived Dyspnea  0-4      Resistance Training   Training Prescription  Yes    Weight  4 lb    Reps  10-15       Discharge Exercise Prescription (Final Exercise Prescription Changes): Exercise Prescription Changes - 09/15/17 1100      Response to Exercise   Blood Pressure (Admit)  120/62    Blood Pressure (Exercise)  132/82    Blood Pressure (Exit)  110/70    Heart Rate (Admit)  72 bpm    Heart Rate (Exercise)  117 bpm    Heart Rate (Exit)  76 bpm    Rating of Perceived Exertion (Exercise)  14    Symptoms  none    Duration  Continue with 45 min of  aerobic exercise without signs/symptoms of physical distress.    Intensity  THRR unchanged      Progression   Progression  Continue to progress workloads to maintain intensity without signs/symptoms of physical distress.    Average METs  6.37      Resistance Training   Training Prescription  Yes    Weight  4 lb    Reps  10-15      Interval Training   Interval Training  No      Treadmill   MPH  3    Grade  2.5    Minutes  15    METs  4.54      Recumbant Bike   Level  4    RPM  60    Minutes  15    METs  8.2      Home Exercise Plan   Plans to continue exercise at  Home (comment) add 1 day of walking    Frequency  Add 1 additional day to program exercise sessions.    Initial Home Exercises Provided  08/20/17       Functional Capacity: 6 Minute Walk    Row Name 07/30/17 1427 09/17/17 1726       6 Minute Walk   Phase  -  Discharge    Distance  1400 feet  1675 feet    Distance % Change  -  19 %    Distance Feet Change  -  275 ft    Walk Time  6 minutes  6 minutes    # of Rest Breaks  0  0    MPH  2.65  3.17    METS  3.77  4.3    RPE  11  14    Perceived Dyspnea   2  -    VO2 Peak  13.2  15    Symptoms  Yes (comment)  No    Comments  vertigo ( makes sure he look sahead not at floor and is fine)  -    Resting HR  53 bpm  75 bpm    Resting BP  124/68  128/58    Resting Oxygen Saturation   98 %  -    Max Ex. HR  103 bpm  120 bpm    Max Ex. BP  152/70  136/60    2 Minute Post BP  130/74  -       Psychological, QOL, Others - Outcomes: PHQ 2/9: Depression screen [facility] 2/9 07/30/2017 03/12/2017  Decreased Interest 0 0  Down, Depressed, Hopeless 0 0  PHQ - 2 Score 0 0  Altered sleeping 1 -  Tired, decreased energy 0 -  Change in appetite 0 -  Feeling bad or failure about yourself  0 -  Trouble concentrating 0 -  Moving slowly or fidgety/restless 0 -  Suicidal thoughts 0 -  PHQ-9 Score 1 -  Difficult doing work/chores Not difficult at all -    Quality of  Life: Quality of Life - 09/23/17 1740      Quality of Life Scores   Health/Function Post  21 %    Socioeconomic Post  21 %    Psych/Spiritual Post  21 %    Family Pre  21 %    GLOBAL Pre  21 %       Personal Goals: Goals established at orientation with interventions provided to work toward goal. Personal Goals and Risk Factors at Admission - 07/30/17 1327      Core Components/Risk Factors/Patient Goals on Admission    Weight Management  Yes;Weight Loss    Intervention  Weight Management: Develop a combined nutrition and exercise program designed to reach desired caloric intake, while maintaining appropriate intake of nutrient and fiber, sodium and fats, and appropriate energy expenditure required for the weight goal.;Weight Management: Provide education and appropriate resources to help participant work on and attain dietary goals.;Weight Management/Obesity: Establish reasonable short term and long term weight goals.    Admit Weight  218 lb (98.9 kg)    Goal Weight: Short Term  214 lb (97.1 kg)    Goal Weight: Long Term  195 lb (88.5 kg)    Expected Outcomes  Short Term: Continue to assess and modify interventions until short term weight is achieved;Long Term: Adherence to nutrition and physical activity/exercise program aimed toward attainment of established weight goal;Weight Loss: Understanding of general recommendations for  a balanced deficit meal plan, which promotes 1-2 lb weight loss per week and includes a negative energy balance of [phone] kcal/d;Understanding recommendations for meals to include 15-35% energy as protein, 25-35% energy from fat, 35-60% energy from carbohydrates, less than 225m of dietary cholesterol, 20-35 gm of total fiber daily;Understanding of distribution of calorie intake throughout the day with the consumption of 4-5 meals/snacks    Hypertension  Yes    Intervention  Provide education on lifestyle modifcations including regular physical activity/exercise,  weight management, moderate sodium restriction and increased consumption of fresh fruit, vegetables, and low fat dairy, alcohol moderation, and smoking cessation.;Monitor prescription use compliance.    Expected Outcomes  Short Term: Continued assessment and intervention until BP is < 140/962mHG in hypertensive participants. < 130/809mG in hypertensive participants with diabetes, heart failure or chronic kidney disease.;Long Term: Maintenance of blood pressure at goal levels.    Lipids  Yes    Intervention  Provide education and support for participant on nutrition & aerobic/resistive exercise along with prescribed medications to achieve LDL <45m47mDL >40mg34m Expected Outcomes  Short Term: Participant states understanding of desired cholesterol values and is compliant with medications prescribed. Participant is following exercise prescription and nutrition guidelines.;Long Term: Cholesterol controlled with medications as prescribed, with individualized exercise RX and with personalized nutrition plan. Value goals: LDL < 45mg,41m > 40 mg.        Personal Goals Discharge: Goals and Risk Factor Review    Row Name 09/03/17 1623             Core Components/Risk Factors/Patient Goals Review   Personal Goals Review  Weight Management/Obesity;Hypertension;Lipids       Review  RobertGrahamad good BP measurements in HT.  His Dr increased his cholesterol med to 80 mg once per day.  He is meeting with RD today.       Expected Outcomes  Short - continue meds as directed by Dr, continue to attend HT Long - Follow instruction od MD and RD to lose weight and improve health          Exercise Goals and Review: Exercise Goals    Row Name 07/30/17 1427             Exercise Goals   Increase Physical Activity  Yes       Intervention  Provide advice, education, support and counseling about physical activity/exercise needs.;Develop an individualized exercise prescription for aerobic and resistive  training based on initial evaluation findings, risk stratification, comorbidities and participant's personal goals.       Expected Outcomes  Achievement of increased cardiorespiratory fitness and enhanced flexibility, muscular endurance and strength shown through measurements of functional capacity and personal statement of participant.       Increase Strength and Stamina  Yes       Intervention  Provide advice, education, support and counseling about physical activity/exercise needs.;Develop an individualized exercise prescription for aerobic and resistive training based on initial evaluation findings, risk stratification, comorbidities and participant's personal goals.       Expected Outcomes  Achievement of increased cardiorespiratory fitness and enhanced flexibility, muscular endurance and strength shown through measurements of functional capacity and personal statement of participant.       Able to understand and use rate of perceived exertion (RPE) scale  Yes       Intervention  Provide education and explanation on how to use RPE scale       Expected Outcomes  Short Term: Able to use RPE daily in rehab to express subjective intensity level;Long Term:  Able to use RPE to guide intensity level when exercising independently       Able to understand and use Dyspnea scale  Yes       Intervention  Provide education and explanation on how to use Dyspnea scale       Expected Outcomes  Short Term: Able to use Dyspnea scale daily in rehab to express subjective sense of shortness of breath during exertion;Long Term: Able to use Dyspnea scale to guide intensity level when exercising independently       Knowledge and understanding of Target Heart Rate Range (THRR)  Yes       Intervention  Provide education and explanation of THRR including how the numbers were predicted and where they are located for reference       Expected Outcomes  Short Term: Able to state/look up THRR;Long Term: Able to use THRR to govern  intensity when exercising independently;Short Term: Able to use daily as guideline for intensity in rehab       Able to check pulse independently  Yes       Intervention  Provide education and demonstration on how to check pulse in carotid and radial arteries.;Review the importance of being able to check your own pulse for safety during independent exercise       Expected Outcomes  Short Term: Able to explain why pulse checking is important during independent exercise;Long Term: Able to check pulse independently and accurately       Understanding of Exercise Prescription  Yes       Intervention  Provide education, explanation, and written materials on patient's individual exercise prescription       Expected Outcomes  Short Term: Able to explain program exercise prescription;Long Term: Able to explain home exercise prescription to exercise independently          Nutrition & Weight - Outcomes: Pre Biometrics - 07/30/17 1425      Pre Biometrics   Height  '5\' 11"'  (1.803 m)    Weight  208 lb 3.2 oz (94.4 kg)    Waist Circumference  44 inches    Hip Circumference  42.25 inches    Waist to Hip Ratio  1.04 %    BMI (Calculated)  29.05    Single Leg Stand  30 seconds      Post Biometrics - 09/17/17 1728       Post  Biometrics   Height  '5\' 11"'  (1.803 m)    Weight  209 lb (94.8 kg)    Waist Circumference  44 inches    Hip Circumference  44 inches    Waist to Hip Ratio  1 %    BMI (Calculated)  29.16    Single Leg Stand  30 seconds       Nutrition: Nutrition Therapy & Goals - 09/17/17 1800      Nutrition Therapy   Diet  TLC    Drug/Food Interactions  Statins/Certain Fruits    Protein (specify units)  8-9oz    Fiber  20 grams    Whole Grain Foods  3 servings    Saturated Fats  15 max. grams    Fruits and Vegetables  5 servings/day    Sodium  1500 grams      Personal Nutrition Goals   Nutrition Goal  Continue working to make healthy food choices (high fiber, low fat/ saturated  fat, low sodium)  Comments  Mr. Spray has made significant diet changes to reduce unhealthy fats, sodium, and calories. Encouraged adding more vegetables and fruits, which he will do at home, but will be difficult at work-- eating space not conducive for more than sandwiches.        Nutrition Discharge: Nutrition Assessments - 09/23/17 1740      MEDFICTS Scores   Post Score  15       Education Questionnaire Score: Knowledge Questionnaire Score - 09/23/17 1742      Knowledge Questionnaire Score   Post Score  28/28       Goals reviewed with patient; copy given to patient.

## 2017-09-23 NOTE — Progress Notes (Signed)
Daily Session Note  Patient Details  Name: NAMEER SUMMER MRN: [id number] Date of Birth: 07/08/1956 Referring Provider:     Cardiac Rehab from 07/30/2017 in [facility]  Referring Provider  [name]      Encounter Date: 09/23/2017  Check In: Session Check In - 09/23/17 1707      Check-In   Location  [facility]    Staff Present  Renita Papa, RN Vickki Hearing, BA, ACSM CEP, Exercise Physiologist;Carroll Enterkin, RN, BSN    Supervising physician immediately available to respond to emergencies  See telemetry face sheet for immediately available ER MD    Medication changes reported      No    Fall or balance concerns reported     No    Warm-up and Cool-down  Performed on first and last piece of equipment    Resistance Training Performed  Yes    VAD Patient?  No      Pain Assessment   Currently in Pain?  No/denies          Social History   Tobacco Use  Smoking Status Former Smoker  . Last attempt to quit: 07/28/1968  . Years since quitting: 49.1  Smokeless Tobacco Never Used    Goals Met:  Independence with exercise equipment Exercise tolerated well No report of cardiac concerns or symptoms Strength training completed today  Goals Unmet:  Not Applicable  Comments:  Matei graduated today from  rehab with 36 sessions completed.  Details of the patient's exercise prescription and what He needs to do in order to continue the prescription and progress were discussed with patient.  Patient was given a copy of prescription and goals.  Patient verbalized understanding.  Aksel plans to continue to exercise by continuing to exercise at home.    Dr. Emily Filbert is Medical Director for [facility].

## 2017-09-23 NOTE — Progress Notes (Signed)
Cardiac Individual Treatment Plan  Patient Details  Name: Jason Gross MRN: [id number] Date of Birth: [birth year] Referring Provider:     Cardiac Rehab from 07/30/2017 in [facility] Cardiac and Pulmonary Rehab  Referring Provider  [name]      Initial Encounter Date:    Cardiac Rehab from 07/30/2017 in [facility] Cardiac and Pulmonary Rehab  Date  07/30/17  Referring Provider  [name]      Visit Diagnosis: NSTEMI (non-ST elevated myocardial infarction) [facility])  Patient's Home Medications on Admission:  Current Outpatient Medications:  .  aspirin EC 81 MG EC tablet, Take 1 tablet (81 mg total) by mouth daily., Disp: 30 tablet, Rfl: 0 .  atorvastatin (LIPITOR) 40 MG tablet, Take 1 tablet (40 mg total) by mouth daily at 6 PM., Disp: 30 tablet, Rfl: 0 .  cetirizine (ZYRTEC) 10 MG tablet, Take 10 mg by mouth daily.  , Disp: , Rfl:  .  clopidogrel (PLAVIX) 75 MG tablet, Take 1 tablet (75 mg total) by mouth daily., Disp: 30 tablet, Rfl: 0 .  guaiFENesin-codeine 100-10 MG/5ML syrup, Take 10 mLs by mouth every 4 (four) hours as needed for cough., Disp: 120 mL, Rfl: 0 .  HYDROcodone-acetaminophen (NORCO) 5-325 MG tablet, Take 1 tablet by mouth every 12 (twelve) hours as needed for severe pain. (Patient not taking: Reported on 07/30/2017), Disp: 10 tablet, Rfl: 0 .  ibuprofen (ADVIL,MOTRIN) 400 MG tablet, Take 1 tablet (400 mg total) by mouth every 6 (six) hours as needed., Disp: 30 tablet, Rfl: 0 .  lisinopril (PRINIVIL,ZESTRIL) 5 MG tablet, Take 0.5 tablets (2.5 mg total) by mouth daily., Disp: 30 tablet, Rfl: 0 .  metoprolol tartrate (LOPRESSOR) 25 MG tablet, Take 1 tablet (25 mg total) by mouth 2 (two) times daily., Disp: 60 tablet, Rfl: 0  Past Medical History: Past Medical History:  Diagnosis Date  . Allergy   . Benign mass of parotid gland   . GERD (gastroesophageal reflux disease)   . Hyperlipidemia    not on meds  . Hypertension   . Vertigo     Tobacco Use: Social History   Tobacco Use    Smoking Status Former Smoker  . Last attempt to quit: 07/28/1968  . Years since quitting: 49.1  Smokeless Tobacco Never Used    Labs: Recent Chemical engineer    Labs for ITP Cardiac and Pulmonary Rehab Latest Ref Rng & Units 06/30/2017 06/30/2017 06/30/2017 06/30/2017 07/03/2017   Cholestrol 0 - 200 mg/dL - 153 - - -   LDLCALC 0 - 99 mg/dL - 91 - - -   HDL >40 mg/dL - 28(L) - - -   Trlycerides <150 mg/dL - 170(H) 168(H) - -   Hemoglobin A1c 4.8 - 5.6 % - 5.6 - - -   PHART 7.350 - 7.450 7.28(L) - - 7.25(L) 7.41   PCO2ART 32.0 - 48.0 mmHg 43 - - 54(H) 52(H)   HCO3 20.0 - 28.0 mmol/L 20.2 - - 23.1 33.0(H)   ACIDBASEDEF 0.0 - 2.0 mmol/L 6.4(H) - - 5.0(H) -   O2SAT % 96.0 - - 99.8 97.1       Exercise Target Goals:    Exercise Program Goal: Individual exercise prescription set using results from initial 6 min walk test and THRR while considering  patient's activity barriers and safety.   Exercise Prescription Goal: Initial exercise prescription builds to 30-45 minutes a day of aerobic activity, 2-3 days per week.  Home exercise guidelines will be given to patient during program as  part of exercise prescription that the participant will acknowledge.  Activity Barriers & Risk Stratification: Activity Barriers & Cardiac Risk Stratification - 07/30/17 1337      Activity Barriers & Cardiac Risk Stratification   Activity Barriers  Other (comment)    Comments  pain from 4 broken ribs related to CPR    Cardiac Risk Stratification  Moderate       6 Minute Walk: 6 Minute Walk    Row Name 07/30/17 1427 09/17/17 1726       6 Minute Walk   Phase  -  Discharge    Distance  1400 feet  1675 feet    Distance % Change  -  19 %    Distance Feet Change  -  275 ft    Walk Time  6 minutes  6 minutes    # of Rest Breaks  0  0    MPH  2.65  3.17    METS  3.77  4.3    RPE  11  14    Perceived Dyspnea   2  -    VO2 Peak  13.2  15    Symptoms  Yes (comment)  No    Comments  vertigo ( makes  sure he look sahead not at floor and is fine)  -    Resting HR  53 bpm  75 bpm    Resting BP  124/68  128/58    Resting Oxygen Saturation   98 %  -    Max Ex. HR  103 bpm  120 bpm    Max Ex. BP  152/70  136/60    2 Minute Post BP  130/74  -       Oxygen Initial Assessment:   Oxygen Re-Evaluation:   Oxygen Discharge (Final Oxygen Re-Evaluation):   Initial Exercise Prescription: Initial Exercise Prescription - 07/30/17 1400      Date of Initial Exercise RX and Referring Provider   Date  07/30/17    Referring Provider  [name]      Treadmill   MPH  2.5    Grade  2    Minutes  15    METs  3.6      Recumbant Bike   Level  3    RPM  60    Watts  50    Minutes  15    METs  3.6      T5 Nustep   Level  3    SPM  80    Minutes  15    METs  3.6      Prescription Details   Frequency (times per week)  3    Duration  Progress to 45 minutes of aerobic exercise without signs/symptoms of physical distress      Intensity   THRR 40-80% of Max Heartrate  95-138    Ratings of Perceived Exertion  11-13    Perceived Dyspnea  0-4      Resistance Training   Training Prescription  Yes    Weight  4 lb    Reps  10-15       Perform Capillary Blood Glucose checks as needed.  Exercise Prescription Changes: Exercise Prescription Changes    Row Name 07/30/17 1400 08/05/17 1200 08/18/17 1500 08/20/17 1600 09/02/17 1200     Response to Exercise   Blood Pressure (Admit)  124/68  142/72  118/68  -  110/60   Blood Pressure (Exercise)  152/70  158/84  156/72  -  176/76   Blood Pressure (Exit)  130/74  112/78  -  -  -   Heart Rate (Admit)  54 bpm  71 bpm  63 bpm  -  60 bpm   Heart Rate (Exercise)  103 bpm  123 bpm  114 bpm  -  118 bpm   Heart Rate (Exit)  103 bpm  96 bpm  73 bpm  -  74 bpm   Oxygen Saturation (Admit)  98 %  -  -  -  -   Rating of Perceived Exertion (Exercise)  _0 -  15   Symptoms  -  none  none  -  none   Duration  -  Progress to 45 minutes of aerobic  exercise without signs/symptoms of physical distress  Progress to 45 minutes of aerobic exercise without signs/symptoms of physical distress  -  Progress to 45 minutes of aerobic exercise without signs/symptoms of physical distress   Intensity  -  THRR unchanged  THRR unchanged  -  THRR unchanged     Progression   Progression  -  Continue to progress workloads to maintain intensity without signs/symptoms of physical distress.  Continue to progress workloads to maintain intensity without signs/symptoms of physical distress.  -  Continue to progress workloads to maintain intensity without signs/symptoms of physical distress.   Average METs  -  3.65  3.67  -  3.4     Resistance Training   Training Prescription  -  Yes  Yes  -  Yes   Weight  -  4 lb  4 lb  -  4 lb   Reps  -  10-15  10-15  -  10-15     Interval Training   Interval Training  -  -  -  -  No     Treadmill   MPH  -  2.5  -  -  3   Grade  -  2  -  -  2.5   Minutes  -  15  -  -  15   METs  -  3.6  -  -  4.54     Recumbant Bike   Level  -  3  4  -  -   RPM  -  60  60  -  -   Watts  -  50  50  -  -   Minutes  -  15  15  -  -   METs  -  3.71  3.7  -  -     T5 Nustep   Level  -  -  -  -  5   SPM  -  -  -  -  80   Minutes  -  -  -  -  15   METs  -  -  -  -  2.8     Home Exercise Plan   Plans to continue exercise at  -  -  -  Home (comment) add 1 day of walking  Home (comment) add 1 day of walking   Frequency  -  -  -  Add 1 additional day to program exercise sessions.  Add 1 additional day to program exercise sessions.   Initial Home Exercises Provided  -  -  -  08/20/17  08/20/17   Row Name 09/15/17 1100             Response  to Exercise   Blood Pressure (Admit)  120/62       Blood Pressure (Exercise)  132/82       Blood Pressure (Exit)  110/70       Heart Rate (Admit)  72 bpm       Heart Rate (Exercise)  117 bpm       Heart Rate (Exit)  76 bpm       Rating of Perceived Exertion (Exercise)  14       Symptoms  none        Duration  Continue with 45 min of aerobic exercise without signs/symptoms of physical distress.       Intensity  THRR unchanged         Progression   Progression  Continue to progress workloads to maintain intensity without signs/symptoms of physical distress.       Average METs  6.37         Resistance Training   Training Prescription  Yes       Weight  4 lb       Reps  10-15         Interval Training   Interval Training  No         Treadmill   MPH  3       Grade  2.5       Minutes  15       METs  4.54         Recumbant Bike   Level  4       RPM  60       Minutes  15       METs  8.2         Home Exercise Plan   Plans to continue exercise at  Home (comment) add 1 day of walking       Frequency  Add 1 additional day to program exercise sessions.       Initial Home Exercises Provided  08/20/17          Exercise Comments: Exercise Comments    Row Name 08/03/17 1731           Exercise Comments   First full day of exercise!  Patient was oriented to gym and equipment including functions, settings, policies, and procedures.  Patient's individual exercise prescription and treatment plan were reviewed.  All starting workloads were established based on the results of the 6 minute walk test done at initial orientation visit.  The plan for exercise progression was also introduced and progression will be customized based on patient's performance and goals.          Exercise Goals and Review: Exercise Goals    Row Name 07/30/17 1427             Exercise Goals   Increase Physical Activity  Yes       Intervention  Provide advice, education, support and counseling about physical activity/exercise needs.;Develop an individualized exercise prescription for aerobic and resistive training based on initial evaluation findings, risk stratification, comorbidities and participant's personal goals.       Expected Outcomes  Achievement of increased cardiorespiratory fitness and  enhanced flexibility, muscular endurance and strength shown through measurements of functional capacity and personal statement of participant.       Increase Strength and Stamina  Yes       Intervention  Provide advice, education, support and counseling about physical activity/exercise needs.;Develop an individualized exercise prescription for aerobic and resistive  training based on initial evaluation findings, risk stratification, comorbidities and participant's personal goals.       Expected Outcomes  Achievement of increased cardiorespiratory fitness and enhanced flexibility, muscular endurance and strength shown through measurements of functional capacity and personal statement of participant.       Able to understand and use rate of perceived exertion (RPE) scale  Yes       Intervention  Provide education and explanation on how to use RPE scale       Expected Outcomes  Short Term: Able to use RPE daily in rehab to express subjective intensity level;Long Term:  Able to use RPE to guide intensity level when exercising independently       Able to understand and use Dyspnea scale  Yes       Intervention  Provide education and explanation on how to use Dyspnea scale       Expected Outcomes  Short Term: Able to use Dyspnea scale daily in rehab to express subjective sense of shortness of breath during exertion;Long Term: Able to use Dyspnea scale to guide intensity level when exercising independently       Knowledge and understanding of Target Heart Rate Range (THRR)  Yes       Intervention  Provide education and explanation of THRR including how the numbers were predicted and where they are located for reference       Expected Outcomes  Short Term: Able to state/look up THRR;Long Term: Able to use THRR to govern intensity when exercising independently;Short Term: Able to use daily as guideline for intensity in rehab       Able to check pulse independently  Yes       Intervention  Provide education and  demonstration on how to check pulse in carotid and radial arteries.;Review the importance of being able to check your own pulse for safety during independent exercise       Expected Outcomes  Short Term: Able to explain why pulse checking is important during independent exercise;Long Term: Able to check pulse independently and accurately       Understanding of Exercise Prescription  Yes       Intervention  Provide education, explanation, and written materials on patient's individual exercise prescription       Expected Outcomes  Short Term: Able to explain program exercise prescription;Long Term: Able to explain home exercise prescription to exercise independently          Exercise Goals Re-Evaluation : Exercise Goals Re-Evaluation    Row Name 08/18/17 1521 08/20/17 1644 09/02/17 1221 09/15/17 1129       Exercise Goal Re-Evaluation   Exercise Goals Review  Increase Physical Activity;Able to understand and use rate of perceived exertion (RPE) scale;Increase Strength and Stamina  Increase Physical Activity;Increase Strength and Stamina;Able to check pulse independently  Increase Physical Activity;Able to understand and use rate of perceived exertion (RPE) scale;Increase Strength and Stamina  Increase Physical Activity;Able to understand and use rate of perceived exertion (RPE) scale;Understanding of Exercise Prescription    Comments  Hulan is tolerating exercise well. Staff will continue to monitor.  Reviewed home exercise with pt today.  Pt plans to walk at home for exercise.  Reviewed THR, pulse, RPE, sign and symptoms, NTG use, and when to call 911 or MD.  Also discussed weather considerations and indoor options.  Pt voiced understanding. Practice checking pulse   Revel is progressing well with exercise.  He has increased TM speed and grade and resistance  on T5.  Staff will progress Joud to interval training.  Orlan has increased average METS to over 6 during class.  Staff wil continue to  monitor    Expected Outcomes  Short - Daquann will increase levels on all machines.  Long - Khing will improve overall MET level  Short: add 1 day of walking at home, practice checking his pulse. Long: add more days to home exercise, check pulse independently  Short - Qualyn will add interval training to his program  Long - Josuel will continue to improve MET levels  SHort - Denvil will finish HT program Long - Alexande will continue to exercise on his own       Discharge Exercise Prescription (Final Exercise Prescription Changes): Exercise Prescription Changes - 09/15/17 1100      Response to Exercise   Blood Pressure (Admit)  120/62    Blood Pressure (Exercise)  132/82    Blood Pressure (Exit)  110/70    Heart Rate (Admit)  72 bpm    Heart Rate (Exercise)  117 bpm    Heart Rate (Exit)  76 bpm    Rating of Perceived Exertion (Exercise)  14    Symptoms  none    Duration  Continue with 45 min of aerobic exercise without signs/symptoms of physical distress.    Intensity  THRR unchanged      Progression   Progression  Continue to progress workloads to maintain intensity without signs/symptoms of physical distress.    Average METs  6.37      Resistance Training   Training Prescription  Yes    Weight  4 lb    Reps  10-15      Interval Training   Interval Training  No      Treadmill   MPH  3    Grade  2.5    Minutes  15    METs  4.54      Recumbant Bike   Level  4    RPM  60    Minutes  15    METs  8.2      Home Exercise Plan   Plans to continue exercise at  Home (comment) add 1 day of walking    Frequency  Add 1 additional day to program exercise sessions.    Initial Home Exercises Provided  08/20/17       Nutrition:  Target Goals: Understanding of nutrition guidelines, daily intake of sodium '1500mg'$ , cholesterol '200mg'$ , calories 30% from fat and 7% or less from saturated fats, daily to have 5 or more servings of fruits and vegetables.  Biometrics: Pre Biometrics -  07/30/17 1425      Pre Biometrics   Height  '5\' 11"'$  (1.803 m)    Weight  208 lb 3.2 oz (94.4 kg)    Waist Circumference  44 inches    Hip Circumference  42.25 inches    Waist to Hip Ratio  1.04 %    BMI (Calculated)  29.05    Single Leg Stand  30 seconds      Post Biometrics - 09/17/17 1728       Post  Biometrics   Height  '5\' 11"'$  (1.803 m)    Weight  209 lb (94.8 kg)    Waist Circumference  44 inches    Hip Circumference  44 inches    Waist to Hip Ratio  1 %    BMI (Calculated)  29.16    Single Leg Stand  30 seconds  Nutrition Therapy Plan and Nutrition Goals: Nutrition Therapy & Goals - 09/17/17 1800      Nutrition Therapy   Diet  TLC    Drug/Food Interactions  Statins/Certain Fruits    Protein (specify units)  8-9oz    Fiber  20 grams    Whole Grain Foods  3 servings    Saturated Fats  15 max. grams    Fruits and Vegetables  5 servings/day    Sodium  1500 grams      Personal Nutrition Goals   Nutrition Goal  Continue working to make healthy food choices (high fiber, low fat/ saturated fat, low sodium)    Comments  Mr. Riggenbach has made significant diet changes to reduce unhealthy fats, sodium, and calories. Encouraged adding more vegetables and fruits, which he will do at home, but will be difficult at work-- eating space not conducive for more than sandwiches.        Nutrition Assessments: Nutrition Assessments - 07/30/17 1330      MEDFICTS Scores   Pre Score  -- will bring Medficts to first day of class       Nutrition Goals Re-Evaluation:   Nutrition Goals Discharge (Final Nutrition Goals Re-Evaluation):   Psychosocial: Target Goals: Acknowledge presence or absence of significant depression and/or stress, maximize coping skills, provide positive support system. Participant is able to verbalize types and ability to use techniques and skills needed for reducing stress and depression.   Initial Review & Psychosocial Screening: Initial Psych Review &  Screening - 07/30/17 1330      Initial Review   Current issues with  Current Sleep Concerns pain from broken ribs related to CPR, but getting better      [place]?  Yes Wife and family      Screening Interventions   Interventions  Yes;Encouraged to exercise;Program counselor consult    Expected Outcomes  Short Term goal: Utilizing psychosocial counselor, staff and physician to assist with identification of specific Stressors or current issues interfering with healing process. Setting desired goal for each stressor or current issue identified.;Short Term goal: Identification and review with participant of any Quality of Life or Depression concerns found by scoring the questionnaire.;Long Term Goal: Stressors or current issues are controlled or eliminated.;Long Term goal: The participant improves quality of Life and PHQ9 Scores as seen by post scores and/or verbalization of changes       Quality of Life Scores:  Quality of Life - 07/30/17 1201      Quality of Life Scores   Health/Function Pre  29.14 %    Socioeconomic Pre  30 %    Psych/Spiritual Pre  30 %    Family Pre  30 %    GLOBAL Pre  29.64 %      Scores of 19 and below usually indicate a poorer quality of life in these areas.  A difference of  2-3 points is a clinically meaningful difference.  A difference of 2-3 points in the total score of the Quality of Life Index has been associated with significant improvement in overall quality of life, self-image, physical symptoms, and general health in studies assessing change in quality of life.  PHQ-9: Recent Review Flowsheet Data    Depression screen [facility] 2/9 07/30/2017 03/12/2017   Decreased Interest 0 0   Down, Depressed, Hopeless 0 0   PHQ - 2 Score 0 0   Altered sleeping 1  -   Tired, decreased energy 0 -  Change in appetite 0 -   Feeling bad or failure about yourself  0 -   Trouble concentrating 0 -   Moving slowly or fidgety/restless 0 -    Suicidal thoughts 0 -   PHQ-9 Score 1 -   Difficult doing work/chores Not difficult at all -     Interpretation of Total Score  Total Score Depression Severity:  1-4 = Minimal depression, 5-9 = Mild depression, 10-14 = Moderate depression, 15-19 = Moderately severe depression, 20-27 = Severe depression   Psychosocial Evaluation and Intervention: Psychosocial Evaluation - 08/03/17 1738      Psychosocial Evaluation & Interventions   Interventions  Encouraged to exercise with the program and follow exercise prescription;Stress management education    Comments  Counselor met with Mr. Uvaldo Bristle) today for initial psychosocial evaluation.  He is a 62 year old who had a heart attack on 12/7.  Aundray has a strong support system with a spouse of 17 years; a son locally and parents who live close by.  Kymir reports being in good health overall with the exception of vertigo and his heart condition.  He sleeps well and has a good appetite.  Johm denies a history of depression or anxiety or any current symptoms and states he is typically in a positive mood.  Jayant has some stress in his life with a mother who has dementia and also not being able to return to work due to the heavy lifting limits currently.  This is stressful financially but he reports his spouse is working and they are able to make ends meet.  Dannell has goals to get stronger and get back to work as soon as possible.  Staff will follow with Herbie Baltimore throughout the course of this program.      Expected Outcomes  Juris will benefit with consistent exercise to achieve his stated goals.  The educational and psychoeducational aspects of this program will be helpful in understanding and coping more positively with his condition.  Staff will follow with Herbie [place].      Continue Psychosocial Services   Follow up required by staff       Psychosocial Re-Evaluation: Psychosocial Re-Evaluation    [place] Name 09/03/17 1628             Psychosocial  Re-Evaluation   Current issues with  None Identified       Interventions  Encouraged to attend Cardiac Rehabilitation for the exercise       Continue Psychosocial Services   Follow up required by staff          Psychosocial Discharge (Final Psychosocial Re-Evaluation): Psychosocial Re-Evaluation - 09/03/17 1628      Psychosocial Re-Evaluation   Current issues with  None Identified    Interventions  Encouraged to attend Cardiac Rehabilitation for the exercise    Continue Psychosocial Services   Follow up required by staff       Vocational Rehabilitation: Provide vocational rehab assistance to qualifying candidates.   Vocational Rehab Evaluation & Intervention: Vocational Rehab - 07/30/17 1336      Initial Vocational Rehab Evaluation & Intervention   Assessment shows need for Vocational Rehabilitation  No       Education: Education Goals: Education classes will be provided on a variety of topics geared toward better understanding of heart health and risk factor modification. Participant will state understanding/return demonstration of topics presented as noted by education test scores.  Learning Barriers/Preferences: Learning Barriers/Preferences - 07/30/17 1334  Learning Barriers/Preferences   Learning Barriers  None    Learning Preferences  None       Education Topics:  AED/CPR: - Group verbal and written instruction with the use of models to demonstrate the basic use of the AED with the basic ABC's of resuscitation.   Cardiac Rehab from 09/21/2017 in [facility] Cardiac and Pulmonary Rehab  Date  09/07/17  Educator  MA  Instruction Review Code  1- Verbalizes Understanding      General Nutrition Guidelines/Fats and Fiber: -Group instruction provided by verbal, written material, models and posters to present the general guidelines for heart healthy nutrition. Gives an explanation and review of dietary fats and fiber.   Cardiac Rehab from 09/21/2017 in [facility] Cardiac and  Pulmonary Rehab  Date  08/24/17  Educator  PI  Instruction Review Code  1- Verbalizes Understanding      Controlling Sodium/Reading Food Labels: -Group verbal and written material supporting the discussion of sodium use in heart healthy nutrition. Review and explanation with models, verbal and written materials for utilization of the food label.   Cardiac Rehab from 09/21/2017 in [facility] Cardiac and Pulmonary Rehab  Date  08/31/17  Educator  PI  Instruction Review Code  1- Verbalizes Understanding      Exercise Physiology & General Exercise Guidelines: - Group verbal and written instruction with models to review the exercise physiology of the cardiovascular system and associated critical values. Provides general exercise guidelines with specific guidelines to those with heart or lung disease.    Cardiac Rehab from 09/21/2017 in [facility] Cardiac and Pulmonary Rehab  Date  09/14/17  Educator  [facility]  Instruction Review Code  1- Verbalizes Understanding      Aerobic Exercise & Resistance Training: - Gives group verbal and written instruction on the various components of exercise. Focuses on aerobic and resistive training programs and the benefits of this training and how to safely progress through these programs..   Cardiac Rehab from 09/21/2017 in [facility] Cardiac and Pulmonary Rehab  Date  09/21/17  Educator  AS  Instruction Review Code  1- Verbalizes Understanding      Flexibility, Balance, Mind/Body Relaxation: Provides group verbal/written instruction on the benefits of flexibility and balance training, including mind/body exercise modes such as yoga, pilates and tai chi.  Demonstration and skill practice provided.   Cardiac Rehab from 09/21/2017 in [facility] Cardiac and Pulmonary Rehab  Date  08/03/17  Educator  AS  Instruction Review Code  1- Verbalizes Understanding      Stress and Anxiety: - Provides group verbal and written instruction about the health risks of elevated stress and causes  of high stress.  Discuss the correlation between heart/lung disease and anxiety and treatment options. Review healthy ways to manage with stress and anxiety.   Cardiac Rehab from 09/21/2017 in [facility] Cardiac and Pulmonary Rehab  Date  08/19/17  Educator  [facility]  Instruction Review Code  1- Verbalizes Understanding      Depression: - Provides group verbal and written instruction on the correlation between heart/lung disease and depressed mood, treatment options, and the stigmas associated with seeking treatment.   Cardiac Rehab from 09/21/2017 in [facility] Cardiac and Pulmonary Rehab  Date  09/16/17  Educator  [facility]  Instruction Review Code  1- Verbalizes Understanding      Anatomy & Physiology of the Heart: - Group verbal and written instruction and models provide basic cardiac anatomy and physiology, with the coronary electrical and arterial systems. Review of Valvular disease and Heart  Failure   Cardiac Rehab from 09/21/2017 in [facility] Cardiac and Pulmonary Rehab  Date  08/17/17  Educator  CE  Instruction Review Code  1- Verbalizes Understanding      Cardiac Procedures: - Group verbal and written instruction to review commonly prescribed medications for heart disease. Reviews the medication, class of the drug, and side effects. Includes the steps to properly store meds and maintain the prescription regimen. (beta blockers and nitrates)   Cardiac Rehab from 09/21/2017 in [facility] Cardiac and Pulmonary Rehab  Date  08/26/17  Educator  [facility]  Instruction Review Code  1- Verbalizes Understanding      Cardiac Medications I: - Group verbal and written instruction to review commonly prescribed medications for heart disease. Reviews the medication, class of the drug, and side effects. Includes the steps to properly store meds and maintain the prescription regimen.   Cardiac Rehab from 09/21/2017 in [facility] Cardiac and Pulmonary Rehab  Date  08/10/17 [Part 2 08/12/17 CE]  Educator  [facility]  Instruction Review Code  1-  Verbalizes Understanding      Cardiac Medications II: -Group verbal and written instruction to review commonly prescribed medications for heart disease. Reviews the medication, class of the drug, and side effects. (all other drug classes)   Cardiac Rehab from 09/21/2017 in [facility] Cardiac and Pulmonary Rehab  Date  08/05/17 [risk actors]  Educator  [employer]  Instruction Review Code  1- Verbalizes Understanding       Go Sex-Intimacy & Heart Disease, Get SMART - Goal Setting: - Group verbal and written instruction through game format to discuss heart disease and the return to sexual intimacy. Provides group verbal and written material to discuss and apply goal setting through the application of the S.M.A.R.T. Method.   Cardiac Rehab from 09/21/2017 in [facility] Cardiac and Pulmonary Rehab  Date  08/26/17  Educator  [facility]  Instruction Review Code  1- Verbalizes Understanding      Other Matters of the Heart: - Provides group verbal, written materials and models to describe Stable Angina and Peripheral Artery. Includes description of the disease process and treatment options available to the cardiac patient.   Exercise & Equipment Safety: - Individual verbal instruction and demonstration of equipment use and safety with use of the equipment.   Cardiac Rehab from 09/21/2017 in [facility] Cardiac and Pulmonary Rehab  Date  07/30/17  Educator  [facility]  Instruction Review Code  1- Verbalizes Understanding      Infection Prevention: - Provides verbal and written material to individual with discussion of infection control including proper hand washing and proper equipment cleaning during exercise session.   Cardiac Rehab from 09/21/2017 in [facility] Cardiac and Pulmonary Rehab  Date  07/30/17  Educator  [facility]  Instruction Review Code  1- Verbalizes Understanding      Falls Prevention: - Provides verbal and written material to individual with discussion of falls prevention and safety.   Cardiac Rehab from 09/21/2017 in  [facility] Cardiac and Pulmonary Rehab  Date  07/30/17  Educator  [facility]  Instruction Review Code  1- Verbalizes Understanding      Diabetes: - Individual verbal and written instruction to review signs/symptoms of diabetes, desired ranges of glucose level fasting, after meals and with exercise. Acknowledge that pre and post exercise glucose checks will be done for 3 sessions at entry of program.   Know Your Numbers and Risk Factors: -Group verbal and written instruction about important numbers in your health.  Discussion of what are risk factors and  how they play a role in the disease process.  Review of Cholesterol, Blood Pressure, Diabetes, and BMI and the role they play in your overall health.   Cardiac Rehab from 09/21/2017 in [facility] Cardiac and Pulmonary Rehab  Date  08/05/17 Jeanmarie Plant actors]  Educator  [facility]  Instruction Review Code  1- [employer] Understanding      Sleep Hygiene: -Provides group verbal and written instruction about how sleep can affect your health.  Define sleep hygiene, discuss sleep cycles and impact of sleep habits. Review good sleep hygiene tips.    Cardiac Rehab from 09/21/2017 in [facility] Cardiac and Pulmonary Rehab  Date  09/02/17  Educator  [facility]  Instruction Review Code  1- Verbalizes Understanding      Other: -Provides group and verbal instruction on various topics (see comments)   Knowledge Questionnaire Score: Knowledge Questionnaire Score - 07/30/17 1335      Knowledge Questionnaire Score   Pre Score  23/28 correct answers reviewed with Herbie Baltimore       Core Components/Risk Factors/Patient Goals at Admission: Personal Goals and Risk Factors at Admission - 07/30/17 1327      Core Components/Risk Factors/Patient Goals on Admission    Weight Management  Yes;Weight Loss    Intervention  Weight Management: Develop a combined nutrition and exercise program designed to reach desired caloric intake, while maintaining appropriate intake of nutrient and fiber, sodium and  fats, and appropriate energy expenditure required for the weight goal.;Weight Management: Provide education and appropriate resources to help participant work on and attain dietary goals.;Weight Management/Obesity: Establish reasonable short term and long term weight goals.    Admit Weight  218 lb (98.9 kg)    Goal Weight: Short Term  214 lb (97.1 kg)    Goal Weight: Long Term  195 lb (88.5 kg)    Expected Outcomes  Short Term: Continue to assess and modify interventions until short term weight is achieved;Long Term: Adherence to nutrition and physical activity/exercise program aimed toward attainment of established weight goal;Weight Loss: Understanding of general recommendations for a balanced deficit meal plan, which promotes 1-2 lb weight loss per week and includes a negative energy balance of [phone] kcal/d;Understanding recommendations for meals to include 15-35% energy as protein, 25-35% energy from fat, 35-60% energy from carbohydrates, less than '200mg'$  of dietary cholesterol, 20-35 gm of total fiber daily;Understanding of distribution of calorie intake throughout the day with the consumption of 4-5 meals/snacks    Hypertension  Yes    Intervention  Provide education on lifestyle modifcations including regular physical activity/exercise, weight management, moderate sodium restriction and increased consumption of fresh fruit, vegetables, and low fat dairy, alcohol moderation, and smoking cessation.;Monitor prescription use compliance.    Expected Outcomes  Short Term: Continued assessment and intervention until BP is < 140/59m HG in hypertensive participants. < 130/863mHG in hypertensive participants with diabetes, heart failure or chronic kidney disease.;Long Term: Maintenance of blood pressure at goal levels.    Lipids  Yes    Intervention  Provide education and support for participant on nutrition & aerobic/resistive exercise along with prescribed medications to achieve LDL '70mg'$ , HDL >'40mg'$ .     Expected Outcomes  Short Term: Participant states understanding of desired cholesterol values and is compliant with medications prescribed. Participant is following exercise prescription and nutrition guidelines.;Long Term: Cholesterol controlled with medications as prescribed, with individualized exercise RX and with personalized nutrition plan. Value goals: LDL < '70mg'$ , HDL > 40 mg.       Core Components/Risk Factors/Patient Goals  Review:  Goals and Risk Factor Review    Row Name 09/03/17 1623             Core Components/Risk Factors/Patient Goals Review   Personal Goals Review  Weight Management/Obesity;Hypertension;Lipids       Review  Khalik has had good BP measurements in [facility].  His Dr increased his cholesterol med to 80 mg once per day.  He is meeting with RD today.       Expected Outcomes  Short - continue meds as directed by Dr, continue to attend [facility] Long - Follow instruction od MD and RD to lose weight and improve health          Core Components/Risk Factors/Patient Goals at Discharge (Final Review):  Goals and Risk Factor Review - 09/03/17 1623      Core Components/Risk Factors/Patient Goals Review   Personal Goals Review  Weight Management/Obesity;Hypertension;Lipids    Review  Ezreal has had good BP measurements in [facility].  His Dr increased his cholesterol med to 80 mg once per day.  He is meeting with RD today.    Expected Outcomes  Short - continue meds as directed by Dr, continue to attend [facility] Long - Follow instruction od MD and RD to lose weight and improve health       ITP Comments: ITP Comments    Row Name 07/30/17 1324 08/26/17 0635 09/23/17 0559       ITP Comments  Med review completed. Initial ITP created. Diangosis can be found in [facility] 06/30/17  30 Day review. Continue with ITP unless directed changes per Medical Director review.  New to program  30 day review. Continue with ITP unless directed changes per Medical Director review.        Comments:

## 2017-09-23 NOTE — Progress Notes (Signed)
Cardiac Individual Treatment Plan  Patient Details  Name: Jason Gross MRN: [id number] Date of Birth: [birth year] Referring Provider:     Cardiac Rehab from 07/30/2017 in [facility] Cardiac and Pulmonary Rehab  Referring Provider  [name]      Initial Encounter Date:    Cardiac Rehab from 07/30/2017 in [facility] Cardiac and Pulmonary Rehab  Date  07/30/17  Referring Provider  [name]      Visit Diagnosis: NSTEMI (non-ST elevated myocardial infarction) [facility])  Patient's Home Medications on Admission:  Current Outpatient Medications:  .  aspirin EC 81 MG EC tablet, Take 1 tablet (81 mg total) by mouth daily., Disp: 30 tablet, Rfl: 0 .  atorvastatin (LIPITOR) 40 MG tablet, Take 1 tablet (40 mg total) by mouth daily at 6 PM., Disp: 30 tablet, Rfl: 0 .  cetirizine (ZYRTEC) 10 MG tablet, Take 10 mg by mouth daily.  , Disp: , Rfl:  .  clopidogrel (PLAVIX) 75 MG tablet, Take 1 tablet (75 mg total) by mouth daily., Disp: 30 tablet, Rfl: 0 .  guaiFENesin-codeine 100-10 MG/5ML syrup, Take 10 mLs by mouth every 4 (four) hours as needed for cough., Disp: 120 mL, Rfl: 0 .  HYDROcodone-acetaminophen (NORCO) 5-325 MG tablet, Take 1 tablet by mouth every 12 (twelve) hours as needed for severe pain. (Patient not taking: Reported on 07/30/2017), Disp: 10 tablet, Rfl: 0 .  ibuprofen (ADVIL,MOTRIN) 400 MG tablet, Take 1 tablet (400 mg total) by mouth every 6 (six) hours as needed., Disp: 30 tablet, Rfl: 0 .  lisinopril (PRINIVIL,ZESTRIL) 5 MG tablet, Take 0.5 tablets (2.5 mg total) by mouth daily., Disp: 30 tablet, Rfl: 0 .  metoprolol tartrate (LOPRESSOR) 25 MG tablet, Take 1 tablet (25 mg total) by mouth 2 (two) times daily., Disp: 60 tablet, Rfl: 0  Past Medical History: Past Medical History:  Diagnosis Date  . Allergy   . Benign mass of parotid gland   . GERD (gastroesophageal reflux disease)   . Hyperlipidemia    not on meds  . Hypertension   . Vertigo     Tobacco Use: Social History   Tobacco Use    Smoking Status Former Smoker  . Last attempt to quit: 07/28/1968  . Years since quitting: 49.1  Smokeless Tobacco Never Used    Labs: Recent Chemical engineer    Labs for ITP Cardiac and Pulmonary Rehab Latest Ref Rng & Units 06/30/2017 06/30/2017 06/30/2017 06/30/2017 07/03/2017   Cholestrol 0 - 200 mg/dL - 153 - - -   LDLCALC 0 - 99 mg/dL - 91 - - -   HDL >40 mg/dL - 28(L) - - -   Trlycerides <150 mg/dL - 170(H) 168(H) - -   Hemoglobin A1c 4.8 - 5.6 % - 5.6 - - -   PHART 7.350 - 7.450 7.28(L) - - 7.25(L) 7.41   PCO2ART 32.0 - 48.0 mmHg 43 - - 54(H) 52(H)   HCO3 20.0 - 28.0 mmol/L 20.2 - - 23.1 33.0(H)   ACIDBASEDEF 0.0 - 2.0 mmol/L 6.4(H) - - 5.0(H) -   O2SAT % 96.0 - - 99.8 97.1       Exercise Target Goals:    Exercise Program Goal: Individual exercise prescription set using results from initial 6 min walk test and THRR while considering  patient's activity barriers and safety.   Exercise Prescription Goal: Initial exercise prescription builds to 30-45 minutes a day of aerobic activity, 2-3 days per week.  Home exercise guidelines will be given to patient during program as  part of exercise prescription that the participant will acknowledge.  Activity Barriers & Risk Stratification: Activity Barriers & Cardiac Risk Stratification - 07/30/17 1337      Activity Barriers & Cardiac Risk Stratification   Activity Barriers  Other (comment)    Comments  pain from 4 broken ribs related to CPR    Cardiac Risk Stratification  Moderate       6 Minute Walk: 6 Minute Walk    Row Name 07/30/17 1427 09/17/17 1726       6 Minute Walk   Phase  -  Discharge    Distance  1400 feet  1675 feet    Distance % Change  -  19 %    Distance Feet Change  -  275 ft    Walk Time  6 minutes  6 minutes    # of Rest Breaks  0  0    MPH  2.65  3.17    METS  3.77  4.3    RPE  11  14    Perceived Dyspnea   2  -    VO2 Peak  13.2  15    Symptoms  Yes (comment)  No    Comments  vertigo ( makes  sure he look sahead not at floor and is fine)  -    Resting HR  53 bpm  75 bpm    Resting BP  124/68  128/58    Resting Oxygen Saturation   98 %  -    Max Ex. HR  103 bpm  120 bpm    Max Ex. BP  152/70  136/60    2 Minute Post BP  130/74  -       Oxygen Initial Assessment:   Oxygen Re-Evaluation:   Oxygen Discharge (Final Oxygen Re-Evaluation):   Initial Exercise Prescription: Initial Exercise Prescription - 07/30/17 1400      Date of Initial Exercise RX and Referring Provider   Date  07/30/17    Referring Provider  [name]      Treadmill   MPH  2.5    Grade  2    Minutes  15    METs  3.6      Recumbant Bike   Level  3    RPM  60    Watts  50    Minutes  15    METs  3.6      T5 Nustep   Level  3    SPM  80    Minutes  15    METs  3.6      Prescription Details   Frequency (times per week)  3    Duration  Progress to 45 minutes of aerobic exercise without signs/symptoms of physical distress      Intensity   THRR 40-80% of Max Heartrate  95-138    Ratings of Perceived Exertion  11-13    Perceived Dyspnea  0-4      Resistance Training   Training Prescription  Yes    Weight  4 lb    Reps  10-15       Perform Capillary Blood Glucose checks as needed.  Exercise Prescription Changes: Exercise Prescription Changes    Row Name 07/30/17 1400 08/05/17 1200 08/18/17 1500 08/20/17 1600 09/02/17 1200     Response to Exercise   Blood Pressure (Admit)  124/68  142/72  118/68  -  110/60   Blood Pressure (Exercise)  152/70  158/84  156/72  -  176/76   Blood Pressure (Exit)  130/74  112/78  -  -  -   Heart Rate (Admit)  54 bpm  71 bpm  63 bpm  -  60 bpm   Heart Rate (Exercise)  103 bpm  123 bpm  114 bpm  -  118 bpm   Heart Rate (Exit)  103 bpm  96 bpm  73 bpm  -  74 bpm   Oxygen Saturation (Admit)  98 %  -  -  -  -   Rating of Perceived Exertion (Exercise)  _0 -  15   Symptoms  -  none  none  -  none   Duration  -  Progress to 45 minutes of aerobic  exercise without signs/symptoms of physical distress  Progress to 45 minutes of aerobic exercise without signs/symptoms of physical distress  -  Progress to 45 minutes of aerobic exercise without signs/symptoms of physical distress   Intensity  -  THRR unchanged  THRR unchanged  -  THRR unchanged     Progression   Progression  -  Continue to progress workloads to maintain intensity without signs/symptoms of physical distress.  Continue to progress workloads to maintain intensity without signs/symptoms of physical distress.  -  Continue to progress workloads to maintain intensity without signs/symptoms of physical distress.   Average METs  -  3.65  3.67  -  3.4     Resistance Training   Training Prescription  -  Yes  Yes  -  Yes   Weight  -  4 lb  4 lb  -  4 lb   Reps  -  10-15  10-15  -  10-15     Interval Training   Interval Training  -  -  -  -  No     Treadmill   MPH  -  2.5  -  -  3   Grade  -  2  -  -  2.5   Minutes  -  15  -  -  15   METs  -  3.6  -  -  4.54     Recumbant Bike   Level  -  3  4  -  -   RPM  -  60  60  -  -   Watts  -  50  50  -  -   Minutes  -  15  15  -  -   METs  -  3.71  3.7  -  -     T5 Nustep   Level  -  -  -  -  5   SPM  -  -  -  -  80   Minutes  -  -  -  -  15   METs  -  -  -  -  2.8     Home Exercise Plan   Plans to continue exercise at  -  -  -  Home (comment) add 1 day of walking  Home (comment) add 1 day of walking   Frequency  -  -  -  Add 1 additional day to program exercise sessions.  Add 1 additional day to program exercise sessions.   Initial Home Exercises Provided  -  -  -  08/20/17  08/20/17   Row Name 09/15/17 1100             Response  to Exercise   Blood Pressure (Admit)  120/62       Blood Pressure (Exercise)  132/82       Blood Pressure (Exit)  110/70       Heart Rate (Admit)  72 bpm       Heart Rate (Exercise)  117 bpm       Heart Rate (Exit)  76 bpm       Rating of Perceived Exertion (Exercise)  14       Symptoms  none        Duration  Continue with 45 min of aerobic exercise without signs/symptoms of physical distress.       Intensity  THRR unchanged         Progression   Progression  Continue to progress workloads to maintain intensity without signs/symptoms of physical distress.       Average METs  6.37         Resistance Training   Training Prescription  Yes       Weight  4 lb       Reps  10-15         Interval Training   Interval Training  No         Treadmill   MPH  3       Grade  2.5       Minutes  15       METs  4.54         Recumbant Bike   Level  4       RPM  60       Minutes  15       METs  8.2         Home Exercise Plan   Plans to continue exercise at  Home (comment) add 1 day of walking       Frequency  Add 1 additional day to program exercise sessions.       Initial Home Exercises Provided  08/20/17          Exercise Comments: Exercise Comments    Row Name 08/03/17 1731 09/23/17 1712         Exercise Comments   First full day of exercise!  Patient was oriented to gym and equipment including functions, settings, policies, and procedures.  Patient's individual exercise prescription and treatment plan were reviewed.  All starting workloads were established based on the results of the 6 minute walk test done at initial orientation visit.  The plan for exercise progression was also introduced and progression will be customized based on patient's performance and goals.  Marsel graduated today from  rehab with 36 sessions completed.  Details of the patient's exercise prescription and what He needs to do in order to continue the prescription and progress were discussed with patient.  Patient was given a copy of prescription and goals.  Patient verbalized understanding.  Julia plans to continue to exercise by continuing to exercise at home.         Exercise Goals and Review: Exercise Goals    Row Name 07/30/17 1427             Exercise Goals   Increase Physical Activity   Yes       Intervention  Provide advice, education, support and counseling about physical activity/exercise needs.;Develop an individualized exercise prescription for aerobic and resistive training based on initial evaluation findings, risk stratification, comorbidities and participant's personal goals.       Expected Outcomes  Achievement of increased cardiorespiratory fitness and enhanced flexibility, muscular endurance and strength shown through measurements of functional capacity and personal statement of participant.       Increase Strength and Stamina  Yes       Intervention  Provide advice, education, support and counseling about physical activity/exercise needs.;Develop an individualized exercise prescription for aerobic and resistive training based on initial evaluation findings, risk stratification, comorbidities and participant's personal goals.       Expected Outcomes  Achievement of increased cardiorespiratory fitness and enhanced flexibility, muscular endurance and strength shown through measurements of functional capacity and personal statement of participant.       Able to understand and use rate of perceived exertion (RPE) scale  Yes       Intervention  Provide education and explanation on how to use RPE scale       Expected Outcomes  Short Term: Able to use RPE daily in rehab to express subjective intensity level;Long Term:  Able to use RPE to guide intensity level when exercising independently       Able to understand and use Dyspnea scale  Yes       Intervention  Provide education and explanation on how to use Dyspnea scale       Expected Outcomes  Short Term: Able to use Dyspnea scale daily in rehab to express subjective sense of shortness of breath during exertion;Long Term: Able to use Dyspnea scale to guide intensity level when exercising independently       Knowledge and understanding of Target Heart Rate Range (THRR)  Yes       Intervention  Provide education and explanation of  THRR including how the numbers were predicted and where they are located for reference       Expected Outcomes  Short Term: Able to state/look up THRR;Long Term: Able to use THRR to govern intensity when exercising independently;Short Term: Able to use daily as guideline for intensity in rehab       Able to check pulse independently  Yes       Intervention  Provide education and demonstration on how to check pulse in carotid and radial arteries.;Review the importance of being able to check your own pulse for safety during independent exercise       Expected Outcomes  Short Term: Able to explain why pulse checking is important during independent exercise;Long Term: Able to check pulse independently and accurately       Understanding of Exercise Prescription  Yes       Intervention  Provide education, explanation, and written materials on patient's individual exercise prescription       Expected Outcomes  Short Term: Able to explain program exercise prescription;Long Term: Able to explain home exercise prescription to exercise independently          Exercise Goals Re-Evaluation : Exercise Goals Re-Evaluation    Row Name 08/18/17 1521 08/20/17 1644 09/02/17 1221 09/15/17 1129       Exercise Goal Re-Evaluation   Exercise Goals Review  Increase Physical Activity;Able to understand and use rate of perceived exertion (RPE) scale;Increase Strength and Stamina  Increase Physical Activity;Increase Strength and Stamina;Able to check pulse independently  Increase Physical Activity;Able to understand and use rate of perceived exertion (RPE) scale;Increase Strength and Stamina  Increase Physical Activity;Able to understand and use rate of perceived exertion (RPE) scale;Understanding of Exercise Prescription    Comments  Jayten is tolerating exercise well. Staff will continue to monitor.  Reviewed home exercise with  pt today.  Pt plans to walk at home for exercise.  Reviewed THR, pulse, RPE, sign and symptoms,  NTG use, and when to call 911 or MD.  Also discussed weather considerations and indoor options.  Pt voiced understanding. Practice checking pulse   Mariusz is progressing well with exercise.  He has increased TM speed and grade and resistance on T5.  Staff will progress Herbie Baltimore to interval training.  Burr has increased average METS to over 6 during class.  Staff wil continue to monitor    Expected Outcomes  Short - Delrick will increase levels on all machines.  Long - Eula will improve overall MET level  Short: add 1 day of walking at home, practice checking his pulse. Long: add more days to home exercise, check pulse independently  Short - Erving will add interval training to his program  Long - Joram will continue to improve MET levels  SHort - Shalom will finish HT program Long - Hildreth will continue to exercise on his own       Discharge Exercise Prescription (Final Exercise Prescription Changes): Exercise Prescription Changes - 09/15/17 1100      Response to Exercise   Blood Pressure (Admit)  120/62    Blood Pressure (Exercise)  132/82    Blood Pressure (Exit)  110/70    Heart Rate (Admit)  72 bpm    Heart Rate (Exercise)  117 bpm    Heart Rate (Exit)  76 bpm    Rating of Perceived Exertion (Exercise)  14    Symptoms  none    Duration  Continue with 45 min of aerobic exercise without signs/symptoms of physical distress.    Intensity  THRR unchanged      Progression   Progression  Continue to progress workloads to maintain intensity without signs/symptoms of physical distress.    Average METs  6.37      Resistance Training   Training Prescription  Yes    Weight  4 lb    Reps  10-15      Interval Training   Interval Training  No      Treadmill   MPH  3    Grade  2.5    Minutes  15    METs  4.54      Recumbant Bike   Level  4    RPM  60    Minutes  15    METs  8.2      Home Exercise Plan   Plans to continue exercise at  Home (comment) add 1 day of walking     Frequency  Add 1 additional day to program exercise sessions.    Initial Home Exercises Provided  08/20/17       Nutrition:  Target Goals: Understanding of nutrition guidelines, daily intake of sodium <1542m, cholesterol <2069m calories 30% from fat and 7% or less from saturated fats, daily to have 5 or more servings of fruits and vegetables.  Biometrics: Pre Biometrics - 07/30/17 1425      Pre Biometrics   Height  _0  (1.803 m)    Weight  208 lb 3.2 oz (94.4 kg)    Waist Circumference  44 inches    Hip Circumference  42.25 inches    Waist to Hip Ratio  1.04 %    BMI (Calculated)  29.05    Single Leg Stand  30 seconds      Post Biometrics - 09/17/17 1728       Post  Biometrics   Height  _0  (1.803 m)    Weight  209 lb (94.8 kg)    Waist Circumference  44 inches    Hip Circumference  44 inches    Waist to Hip Ratio  1 %    BMI (Calculated)  29.16    Single Leg Stand  30 seconds       Nutrition Therapy Plan and Nutrition Goals: Nutrition Therapy & Goals - 09/17/17 1800      Nutrition Therapy   Diet  TLC    Drug/Food Interactions  Statins/Certain Fruits    Protein (specify units)  8-9oz    Fiber  20 grams    Whole Grain Foods  3 servings    Saturated Fats  15 max. grams    Fruits and Vegetables  5 servings/day    Sodium  1500 grams      Personal Nutrition Goals   Nutrition Goal  Continue working to make healthy food choices (high fiber, low fat/ saturated fat, low sodium)    Comments  Mr. Salts has made significant diet changes to reduce unhealthy fats, sodium, and calories. Encouraged adding more vegetables and fruits, which he will do at home, but will be difficult at work-- eating space not conducive for more than sandwiches.        Nutrition Assessments: Nutrition Assessments - 09/23/17 1740      MEDFICTS Scores   Post Score  15       Nutrition Goals Re-Evaluation:   Nutrition Goals Discharge (Final Nutrition Goals  Re-Evaluation):   Psychosocial: Target Goals: Acknowledge presence or absence of significant depression and/or stress, maximize coping skills, provide positive support system. Participant is able to verbalize types and ability to use techniques and skills needed for reducing stress and depression.   Initial Review & Psychosocial Screening: Initial Psych Review & Screening - 07/30/17 1330      Initial Review   Current issues with  Current Sleep Concerns pain from broken ribs related to CPR, but getting better      Victor?  Yes Wife and family      Screening Interventions   Interventions  Yes;Encouraged to exercise;Program counselor consult    Expected Outcomes  Short Term goal: Utilizing psychosocial counselor, staff and physician to assist with identification of specific Stressors or current issues interfering with healing process. Setting desired goal for each stressor or current issue identified.;Short Term goal: Identification and review with participant of any Quality of Life or Depression concerns found by scoring the questionnaire.;Long Term Goal: Stressors or current issues are controlled or eliminated.;Long Term goal: The participant improves quality of Life and PHQ9 Scores as seen by post scores and/or verbalization of changes       Quality of Life Scores:  Quality of Life - 09/23/17 1740      Quality of Life Scores   Health/Function Post  21 %    Socioeconomic Post  21 %    Psych/Spiritual Post  21 %    Family Pre  21 %    GLOBAL Pre  21 %      Scores of 19 and below usually indicate a poorer quality of life in these areas.  A difference of  2-3 points is a clinically meaningful difference.  A difference of 2-3 points in the total score of the Quality of Life Index has been associated with significant improvement in overall quality of life, self-image, physical symptoms, and general health  in studies assessing change in quality of  life.  PHQ-9: Recent Review Flowsheet Data    Depression screen [facility] 2/9 09/23/2017 07/30/2017 03/12/2017   Decreased Interest 0 0 0   Down, Depressed, Hopeless 0 0 0   PHQ - 2 Score 0 0 0   Altered sleeping 0 1  -   Tired, decreased energy 0 0 -   Change in appetite 0 0 -   Feeling bad or failure about yourself  0 0 -   Trouble concentrating 0 0 -   Moving slowly or fidgety/restless 0 0 -   Suicidal thoughts 0 0 -   PHQ-9 Score 0 1 -   Difficult doing work/chores - Not difficult at all -     Interpretation of Total Score  Total Score Depression Severity:  1-4 = Minimal depression, 5-9 = Mild depression, 10-14 = Moderate depression, 15-19 = Moderately severe depression, 20-27 = Severe depression   Psychosocial Evaluation and Intervention: Psychosocial Evaluation - 08/03/17 1738      Psychosocial Evaluation & Interventions   Interventions  Encouraged to exercise with the program and follow exercise prescription;Stress management education    Comments  Counselor met with Mr. Uvaldo Bristle) today for initial psychosocial evaluation.  He is a 62 year old who had a heart attack on 12/7.  Xavyer has a strong support system with a spouse of 89 years; a son locally and parents who live close by.  Hannibal reports being in good health overall with the exception of vertigo and his heart condition.  He sleeps well and has a good appetite.  Mckay denies a history of depression or anxiety or any current symptoms and states he is typically in a positive mood.  Jeray has some stress in his life with a mother who has dementia and also not being able to return to work due to the heavy lifting limits currently.  This is stressful financially but he reports his spouse is working and they are able to make ends meet.  Kailash has goals to get stronger and get back to work as soon as possible.  Staff will follow with Herbie Baltimore throughout the course of this program.      Expected Outcomes  Tu will benefit with  consistent exercise to achieve his stated goals.  The educational and psychoeducational aspects of this program will be helpful in understanding and coping more positively with his condition.  Staff will follow with Herbie Baltimore.      Continue Psychosocial Services   Follow up required by staff       Psychosocial Re-Evaluation: Psychosocial Re-Evaluation    [place] Name 09/03/17 1628             Psychosocial Re-Evaluation   Current issues with  None Identified       Interventions  Encouraged to attend Cardiac Rehabilitation for the exercise       Continue Psychosocial Services   Follow up required by staff          Psychosocial Discharge (Final Psychosocial Re-Evaluation): Psychosocial Re-Evaluation - 09/03/17 1628      Psychosocial Re-Evaluation   Current issues with  None Identified    Interventions  Encouraged to attend Cardiac Rehabilitation for the exercise    Continue Psychosocial Services   Follow up required by staff       Vocational Rehabilitation: Provide vocational rehab assistance to qualifying candidates.   Vocational Rehab Evaluation & Intervention: Vocational Rehab - 07/30/17 1336  Initial Vocational Rehab Evaluation & Intervention   Assessment shows need for Vocational Rehabilitation  No       Education: Education Goals: Education classes will be provided on a variety of topics geared toward better understanding of heart health and risk factor modification. Participant will state understanding/return demonstration of topics presented as noted by education test scores.  Learning Barriers/Preferences: Learning Barriers/Preferences - 07/30/17 1334      Learning Barriers/Preferences   Learning Barriers  None    Learning Preferences  None       Education Topics:  AED/CPR: - Group verbal and written instruction with the use of models to demonstrate the basic use of the AED with the basic ABC's of resuscitation.   Cardiac Rehab from 09/23/2017 in [facility]  Cardiac and Pulmonary Rehab  Date  09/07/17  Educator  MA  Instruction Review Code  1- Verbalizes Understanding      General Nutrition Guidelines/Fats and Fiber: -Group instruction provided by verbal, written material, models and posters to present the general guidelines for heart healthy nutrition. Gives an explanation and review of dietary fats and fiber.   Cardiac Rehab from 09/23/2017 in [facility] Cardiac and Pulmonary Rehab  Date  08/24/17  Educator  PI  Instruction Review Code  1- Verbalizes Understanding      Controlling Sodium/Reading Food Labels: -Group verbal and written material supporting the discussion of sodium use in heart healthy nutrition. Review and explanation with models, verbal and written materials for utilization of the food label.   Cardiac Rehab from 09/23/2017 in [facility] Cardiac and Pulmonary Rehab  Date  08/31/17  Educator  PI  Instruction Review Code  1- Verbalizes Understanding      Exercise Physiology & General Exercise Guidelines: - Group verbal and written instruction with models to review the exercise physiology of the cardiovascular system and associated critical values. Provides general exercise guidelines with specific guidelines to those with heart or lung disease.    Cardiac Rehab from 09/23/2017 in [facility] Cardiac and Pulmonary Rehab  Date  09/14/17  Educator  [facility]  Instruction Review Code  1- Verbalizes Understanding      Aerobic Exercise & Resistance Training: - Gives group verbal and written instruction on the various components of exercise. Focuses on aerobic and resistive training programs and the benefits of this training and how to safely progress through these programs..   Cardiac Rehab from 09/23/2017 in [facility] Cardiac and Pulmonary Rehab  Date  09/21/17  Educator  AS  Instruction Review Code  1- Verbalizes Understanding      Flexibility, Balance, Mind/Body Relaxation: Provides group verbal/written instruction on the benefits of flexibility and  balance training, including mind/body exercise modes such as yoga, pilates and tai chi.  Demonstration and skill practice provided.   Cardiac Rehab from 09/23/2017 in [facility] Cardiac and Pulmonary Rehab  Date  09/23/17  Educator  AS  Instruction Review Code  1- Verbalizes Understanding      Stress and Anxiety: - Provides group verbal and written instruction about the health risks of elevated stress and causes of high stress.  Discuss the correlation between heart/lung disease and anxiety and treatment options. Review healthy ways to manage with stress and anxiety.   Cardiac Rehab from 09/23/2017 in [facility] Cardiac and Pulmonary Rehab  Date  08/19/17  Educator  [facility]  Instruction Review Code  1- Verbalizes Understanding      Depression: - Provides group verbal and written instruction on the correlation between heart/lung disease and depressed mood, treatment options,  and the stigmas associated with seeking treatment.   Cardiac Rehab from 09/23/2017 in [facility] Cardiac and Pulmonary Rehab  Date  09/16/17  Educator  [facility]  Instruction Review Code  1- Verbalizes Understanding      Anatomy & Physiology of the Heart: - Group verbal and written instruction and models provide basic cardiac anatomy and physiology, with the coronary electrical and arterial systems. Review of Valvular disease and Heart Failure   Cardiac Rehab from 09/23/2017 in [facility] Cardiac and Pulmonary Rehab  Date  08/17/17  Educator  CE  Instruction Review Code  1- Verbalizes Understanding      Cardiac Procedures: - Group verbal and written instruction to review commonly prescribed medications for heart disease. Reviews the medication, class of the drug, and side effects. Includes the steps to properly store meds and maintain the prescription regimen. (beta blockers and nitrates)   Cardiac Rehab from 09/23/2017 in [facility] Cardiac and Pulmonary Rehab  Date  08/26/17  Educator  [facility]  Instruction Review Code  1- Verbalizes Understanding       Cardiac Medications I: - Group verbal and written instruction to review commonly prescribed medications for heart disease. Reviews the medication, class of the drug, and side effects. Includes the steps to properly store meds and maintain the prescription regimen.   Cardiac Rehab from 09/23/2017 in [facility] Cardiac and Pulmonary Rehab  Date  08/10/17 [Part 2 08/12/17 CE]  Educator  [facility]  Instruction Review Code  1- Verbalizes Understanding      Cardiac Medications II: -Group verbal and written instruction to review commonly prescribed medications for heart disease. Reviews the medication, class of the drug, and side effects. (all other drug classes)   Cardiac Rehab from 09/23/2017 in [facility] Cardiac and Pulmonary Rehab  Date  08/05/17 [risk actors]  Educator  [facility]  Instruction Review Code  1- [employer] Understanding       Go Sex-Intimacy & Heart Disease, Get SMART - Goal Setting: - Group verbal and written instruction through game format to discuss heart disease and the return to sexual intimacy. Provides group verbal and written material to discuss and apply goal setting through the application of the S.M.A.R.T. Method.   Cardiac Rehab from 09/23/2017 in [facility] Cardiac and Pulmonary Rehab  Date  08/26/17  Educator  [facility]  Instruction Review Code  1- Verbalizes Understanding      Other Matters of the Heart: - Provides group verbal, written materials and models to describe Stable Angina and Peripheral Artery. Includes description of the disease process and treatment options available to the cardiac patient.   Exercise & Equipment Safety: - Individual verbal instruction and demonstration of equipment use and safety with use of the equipment.   Cardiac Rehab from 09/23/2017 in [facility] Cardiac and Pulmonary Rehab  Date  07/30/17  Educator  [facility]  Instruction Review Code  1- Verbalizes Understanding      Infection Prevention: - Provides verbal and written material to individual with discussion of  infection control including proper hand washing and proper equipment cleaning during exercise session.   Cardiac Rehab from 09/23/2017 in [facility] Cardiac and Pulmonary Rehab  Date  07/30/17  Educator  [facility]  Instruction Review Code  1- Verbalizes Understanding      Falls Prevention: - Provides verbal and written material to individual with discussion of falls prevention and safety.   Cardiac Rehab from 09/23/2017 in [facility] Cardiac and Pulmonary Rehab  Date  07/30/17  Educator  [facility]  Instruction Review Code  1- Verbalizes Understanding  Diabetes: - Individual verbal and written instruction to review signs/symptoms of diabetes, desired ranges of glucose level fasting, after meals and with exercise. Acknowledge that pre and post exercise glucose checks will be done for 3 sessions at entry of program.   Know Your Numbers and Risk Factors: -Group verbal and written instruction about important numbers in your health.  Discussion of what are risk factors and how they play a role in the disease process.  Review of Cholesterol, Blood Pressure, Diabetes, and BMI and the role they play in your overall health.   Cardiac Rehab from 09/23/2017 in [facility] Cardiac and Pulmonary Rehab  Date  08/05/17 Jeanmarie Plant actors]  Educator  [facility]  Instruction Review Code  1- [employer] Understanding      Sleep Hygiene: -Provides group verbal and written instruction about how sleep can affect your health.  Define sleep hygiene, discuss sleep cycles and impact of sleep habits. Review good sleep hygiene tips.    Cardiac Rehab from 09/23/2017 in [facility] Cardiac and Pulmonary Rehab  Date  09/02/17  Educator  [facility]  Instruction Review Code  1- Verbalizes Understanding      Other: -Provides group and verbal instruction on various topics (see comments)   Knowledge Questionnaire Score: Knowledge Questionnaire Score - 09/23/17 1742      Knowledge Questionnaire Score   Post Score  28/28       Core Components/Risk Factors/Patient  Goals at Admission: Personal Goals and Risk Factors at Admission - 07/30/17 1327      Core Components/Risk Factors/Patient Goals on Admission    Weight Management  Yes;Weight Loss    Intervention  Weight Management: Develop a combined nutrition and exercise program designed to reach desired caloric intake, while maintaining appropriate intake of nutrient and fiber, sodium and fats, and appropriate energy expenditure required for the weight goal.;Weight Management: Provide education and appropriate resources to help participant work on and attain dietary goals.;Weight Management/Obesity: Establish reasonable short term and long term weight goals.    Admit Weight  218 lb (98.9 kg)    Goal Weight: Short Term  214 lb (97.1 kg)    Goal Weight: Long Term  195 lb (88.5 kg)    Expected Outcomes  Short Term: Continue to assess and modify interventions until short term weight is achieved;Long Term: Adherence to nutrition and physical activity/exercise program aimed toward attainment of established weight goal;Weight Loss: Understanding of general recommendations for a balanced deficit meal plan, which promotes 1-2 lb weight loss per week and includes a negative energy balance of [phone] kcal/d;Understanding recommendations for meals to include 15-35% energy as protein, 25-35% energy from fat, 35-60% energy from carbohydrates, less than '200mg'$  of dietary cholesterol, 20-35 gm of total fiber daily;Understanding of distribution of calorie intake throughout the day with the consumption of 4-5 meals/snacks    Hypertension  Yes    Intervention  Provide education on lifestyle modifcations including regular physical activity/exercise, weight management, moderate sodium restriction and increased consumption of fresh fruit, vegetables, and low fat dairy, alcohol moderation, and smoking cessation.;Monitor prescription use compliance.    Expected Outcomes  Short Term: Continued assessment and intervention until BP is <  140/71m HG in hypertensive participants. < 130/854mHG in hypertensive participants with diabetes, heart failure or chronic kidney disease.;Long Term: Maintenance of blood pressure at goal levels.    Lipids  Yes    Intervention  Provide education and support for participant on nutrition & aerobic/resistive exercise along with prescribed medications to achieve LDL '70mg'$ , HDL >'40mg'$ .  Expected Outcomes  Short Term: Participant states understanding of desired cholesterol values and is compliant with medications prescribed. Participant is following exercise prescription and nutrition guidelines.;Long Term: Cholesterol controlled with medications as prescribed, with individualized exercise RX and with personalized nutrition plan. Value goals: LDL < '70mg'$ , HDL > 40 mg.       Core Components/Risk Factors/Patient Goals Review:  Goals and Risk Factor Review    Row Name 09/03/17 1623             Core Components/Risk Factors/Patient Goals Review   Personal Goals Review  Weight Management/Obesity;Hypertension;Lipids       Review  Codee has had good BP measurements in HT.  His Dr increased his cholesterol med to 80 mg once per day.  He is meeting with RD today.       Expected Outcomes  Short - continue meds as directed by Dr, continue to attend HT Long - Follow instruction od MD and RD to lose weight and improve health          Core Components/Risk Factors/Patient Goals at Discharge (Final Review):  Goals and Risk Factor Review - 09/03/17 1623      Core Components/Risk Factors/Patient Goals Review   Personal Goals Review  Weight Management/Obesity;Hypertension;Lipids    Review  Fedor has had good BP measurements in HT.  His Dr increased his cholesterol med to 80 mg once per day.  He is meeting with RD today.    Expected Outcomes  Short - continue meds as directed by Dr, continue to attend HT Long - Follow instruction od MD and RD to lose weight and improve health       ITP Comments: ITP  Comments    Row Name 07/30/17 1324 08/26/17 0635 09/23/17 0559       ITP Comments  Med review completed. Initial ITP created. [facility]osis can be found in [facility] 06/30/17  30 Day review. Continue with ITP unless directed changes per Medical Director review.  New to program  30 day review. Continue with ITP unless directed changes per Medical Director review.        Comments: Discharge ITP

## 2017-09-23 NOTE — Progress Notes (Signed)
Discharge Progress Report  Patient Details  Name: Jason Gross MRN: [id number] Date of Birth: 03-07-1956 Referring Provider:     Cardiac Rehab from 07/30/2017 in [facility] Cardiac and Pulmonary Rehab  Referring Provider  [name]       Number of Visits: 16  Reason for Discharge:  Patient reached a stable level of exercise. Patient independent in their exercise. Patient has met program and personal goals.  Smoking History:  Social History   Tobacco Use  Smoking Status Former Smoker  . Last attempt to quit: 07/28/1968  . Years since quitting: 49.1  Smokeless Tobacco Never Used    Diagnosis:  NSTEMI (non-ST elevated myocardial infarction) ([place])  ADL [facility]SD:   Initial Exercise Prescription: Initial Exercise Prescription - 07/30/17 1400      Date of Initial Exercise RX and Referring Provider   Date  07/30/17    Referring Provider  [name]      Treadmill   MPH  2.5    Grade  2    Minutes  15    METs  3.6      Recumbant Bike   Level  3    RPM  60    Watts  50    Minutes  15    METs  3.6      T5 Nustep   Level  3    SPM  80    Minutes  15    METs  3.6      Prescription Details   Frequency (times per week)  3    Duration  Progress to 45 minutes of aerobic exercise without signs/symptoms of physical distress      Intensity   THRR 40-80% of Max Heartrate  95-138    Ratings of Perceived Exertion  11-13    Perceived Dyspnea  0-4      Resistance Training   Training Prescription  Yes    Weight  4 lb    Reps  10-15       Discharge Exercise Prescription (Final Exercise Prescription Changes): Exercise Prescription Changes - 09/15/17 1100      Response to Exercise   Blood Pressure (Admit)  120/62    Blood Pressure (Exercise)  132/82    Blood Pressure (Exit)  110/70    Heart Rate (Admit)  72 bpm    Heart Rate (Exercise)  117 bpm    Heart Rate (Exit)  76 bpm    Rating of Perceived Exertion (Exercise)  14    Symptoms  none    Duration  Continue with 45 min of  aerobic exercise without signs/symptoms of physical distress.    Intensity  THRR unchanged      Progression   Progression  Continue to progress workloads to maintain intensity without signs/symptoms of physical distress.    Average METs  6.37      Resistance Training   Training Prescription  Yes    Weight  4 lb    Reps  10-15      Interval Training   Interval Training  No      Treadmill   MPH  3    Grade  2.5    Minutes  15    METs  4.54      Recumbant Bike   Level  4    RPM  60    Minutes  15    METs  8.2      Home Exercise Plan   Plans to continue exercise at  Home (comment) add 1 day of walking    Frequency  Add 1 additional day to program exercise sessions.    Initial Home Exercises Provided  08/20/17       Functional Capacity: 6 Minute Walk    Row Name 07/30/17 1427 09/17/17 1726       6 Minute Walk   Phase  -  Discharge    Distance  1400 feet  1675 feet    Distance % Change  -  19 %    Distance Feet Change  -  275 ft    Walk Time  6 minutes  6 minutes    # of Rest Breaks  0  0    MPH  2.65  3.17    METS  3.77  4.3    RPE  11  14    Perceived Dyspnea   2  -    VO2 Peak  13.2  15    Symptoms  Yes (comment)  No    Comments  vertigo ( makes sure he look sahead not at floor and is fine)  -    Resting HR  53 bpm  75 bpm    Resting BP  124/68  128/58    Resting Oxygen Saturation   98 %  -    Max Ex. HR  103 bpm  120 bpm    Max Ex. BP  152/70  136/60    2 Minute Post BP  130/74  -       Psychological, QOL, Others - Outcomes: PHQ 2/9: Depression screen [facility] 2/9 09/23/2017 07/30/2017 03/12/2017  Decreased Interest 0 0 0  Down, Depressed, Hopeless 0 0 0  PHQ - 2 Score 0 0 0  Altered sleeping 0 1 -  Tired, decreased energy 0 0 -  Change in appetite 0 0 -  Feeling bad or failure about yourself  0 0 -  Trouble concentrating 0 0 -  Moving slowly or fidgety/restless 0 0 -  Suicidal thoughts 0 0 -  PHQ-9 Score 0 1 -  Difficult doing work/chores - Not  difficult at all -    Quality of Life: Quality of Life - 09/23/17 1740      Quality of Life Scores   Health/Function Post  21 %    Socioeconomic Post  21 %    Psych/Spiritual Post  21 %    Family Pre  21 %    GLOBAL Pre  21 %       Personal Goals: Goals established at orientation with interventions provided to work toward goal. Personal Goals and Risk Factors at Admission - 07/30/17 1327      Core Components/Risk Factors/Patient Goals on Admission    Weight Management  Yes;Weight Loss    Intervention  Weight Management: Develop a combined nutrition and exercise program designed to reach desired caloric intake, while maintaining appropriate intake of nutrient and fiber, sodium and fats, and appropriate energy expenditure required for the weight goal.;Weight Management: Provide education and appropriate resources to help participant work on and attain dietary goals.;Weight Management/Obesity: Establish reasonable short term and long term weight goals.    Admit Weight  218 lb (98.9 kg)    Goal Weight: Short Term  214 lb (97.1 kg)    Goal Weight: Long Term  195 lb (88.5 kg)    Expected Outcomes  Short Term: Continue to assess and modify interventions until short term weight is achieved;Long Term: Adherence to nutrition and physical activity/exercise program  aimed toward attainment of established weight goal;Weight Loss: Understanding of general recommendations for a balanced deficit meal plan, which promotes 1-2 lb weight loss per week and includes a negative energy balance of [phone] kcal/d;Understanding recommendations for meals to include 15-35% energy as protein, 25-35% energy from fat, 35-60% energy from carbohydrates, less than '200mg'$  of dietary cholesterol, 20-35 gm of total fiber daily;Understanding of distribution of calorie intake throughout the day with the consumption of 4-5 meals/snacks    Hypertension  Yes    Intervention  Provide education on lifestyle modifcations including  regular physical activity/exercise, weight management, moderate sodium restriction and increased consumption of fresh fruit, vegetables, and low fat dairy, alcohol moderation, and smoking cessation.;Monitor prescription use compliance.    Expected Outcomes  Short Term: Continued assessment and intervention until BP is < 140/47m HG in hypertensive participants. < 130/871mHG in hypertensive participants with diabetes, heart failure or chronic kidney disease.;Long Term: Maintenance of blood pressure at goal levels.    Lipids  Yes    Intervention  Provide education and support for participant on nutrition & aerobic/resistive exercise along with prescribed medications to achieve LDL '70mg'$ , HDL >'40mg'$ .    Expected Outcomes  Short Term: Participant states understanding of desired cholesterol values and is compliant with medications prescribed. Participant is following exercise prescription and nutrition guidelines.;Long Term: Cholesterol controlled with medications as prescribed, with individualized exercise RX and with personalized nutrition plan. Value goals: LDL < '70mg'$ , HDL > 40 mg.        Personal Goals Discharge: Goals and Risk Factor Review    Row Name 09/03/17 1623             Core Components/Risk Factors/Patient Goals Review   Personal Goals Review  Weight Management/Obesity;Hypertension;Lipids       Review  RoJahmaias had good BP measurements in HT.  His Dr increased his cholesterol med to 80 mg once per day.  He is meeting with RD today.       Expected Outcomes  Short - continue meds as directed by Dr, continue to attend HT Long - Follow instruction od MD and RD to lose weight and improve health          Exercise Goals and Review: Exercise Goals    Row Name 07/30/17 1427             Exercise Goals   Increase Physical Activity  Yes       Intervention  Provide advice, education, support and counseling about physical activity/exercise needs.;Develop an individualized exercise  prescription for aerobic and resistive training based on initial evaluation findings, risk stratification, comorbidities and participant's personal goals.       Expected Outcomes  Achievement of increased cardiorespiratory fitness and enhanced flexibility, muscular endurance and strength shown through measurements of functional capacity and personal statement of participant.       Increase Strength and Stamina  Yes       Intervention  Provide advice, education, support and counseling about physical activity/exercise needs.;Develop an individualized exercise prescription for aerobic and resistive training based on initial evaluation findings, risk stratification, comorbidities and participant's personal goals.       Expected Outcomes  Achievement of increased cardiorespiratory fitness and enhanced flexibility, muscular endurance and strength shown through measurements of functional capacity and personal statement of participant.       Able to understand and use rate of perceived exertion (RPE) scale  Yes       Intervention  Provide education and explanation on  how to use RPE scale       Expected Outcomes  Short Term: Able to use RPE daily in rehab to express subjective intensity level;Long Term:  Able to use RPE to guide intensity level when exercising independently       Able to understand and use Dyspnea scale  Yes       Intervention  Provide education and explanation on how to use Dyspnea scale       Expected Outcomes  Short Term: Able to use Dyspnea scale daily in rehab to express subjective sense of shortness of breath during exertion;Long Term: Able to use Dyspnea scale to guide intensity level when exercising independently       Knowledge and understanding of Target Heart Rate Range (THRR)  Yes       Intervention  Provide education and explanation of THRR including how the numbers were predicted and where they are located for reference       Expected Outcomes  Short Term: Able to state/look up  THRR;Long Term: Able to use THRR to govern intensity when exercising independently;Short Term: Able to use daily as guideline for intensity in rehab       Able to check pulse independently  Yes       Intervention  Provide education and demonstration on how to check pulse in carotid and radial arteries.;Review the importance of being able to check your own pulse for safety during independent exercise       Expected Outcomes  Short Term: Able to explain why pulse checking is important during independent exercise;Long Term: Able to check pulse independently and accurately       Understanding of Exercise Prescription  Yes       Intervention  Provide education, explanation, and written materials on patient's individual exercise prescription       Expected Outcomes  Short Term: Able to explain program exercise prescription;Long Term: Able to explain home exercise prescription to exercise independently          Nutrition & Weight - Outcomes: Pre Biometrics - 07/30/17 1425      Pre Biometrics   Height  '5\' 11"'$  (1.803 m)    Weight  208 lb 3.2 oz (94.4 kg)    Waist Circumference  44 inches    Hip Circumference  42.25 inches    Waist to Hip Ratio  1.04 %    BMI (Calculated)  29.05    Single Leg Stand  30 seconds      Post Biometrics - 09/17/17 1728       Post  Biometrics   Height  '5\' 11"'$  (1.803 m)    Weight  209 lb (94.8 kg)    Waist Circumference  44 inches    Hip Circumference  44 inches    Waist to Hip Ratio  1 %    BMI (Calculated)  29.16    Single Leg Stand  30 seconds       Nutrition: Nutrition Therapy & Goals - 09/17/17 1800      Nutrition Therapy   Diet  TLC    Drug/Food Interactions  Statins/Certain Fruits    Protein (specify units)  8-9oz    Fiber  20 grams    Whole Grain Foods  3 servings    Saturated Fats  15 max. grams    Fruits and Vegetables  5 servings/day    Sodium  1500 grams      Personal Nutrition Goals   Nutrition Goal  Continue working  to make healthy food  choices (high fiber, low fat/ saturated fat, low sodium)    Comments  Mr. Macpherson has made significant diet changes to reduce unhealthy fats, sodium, and calories. Encouraged adding more vegetables and fruits, which he will do at home, but will be difficult at work-- eating space not conducive for more than sandwiches.        Nutrition Discharge: Nutrition Assessments - 09/23/17 1740      MEDFICTS Scores   Post Score  15       Education Questionnaire Score: Knowledge Questionnaire Score - 09/23/17 1742      Knowledge Questionnaire Score   Post Score  28/28       Goals reviewed with patient; copy given to patient.

## 2017-11-10 IMAGING — CT CT NECK W/ CM
2 of 3 series · 8 of 14 positions shown, 9 images · IV contrast (iopamidol)
Comparison: Chest radiographs 10/28/2016

CLINICAL DATA: 60-year-old male with palpable abnormality on the
left side of his neck discovered by physician; patient is unable to
localize. Patient reports dizziness and ear congestion.

EXAM:
CT NECK WITH CONTRAST
TECHNIQUE: Multidetector CT imaging of the neck was performed using the
standard protocol following the bolus administration of intravenous
contrast.
CONTRAST:  75mL [id number] IOPAMIDOL ([id number]) INJECTION 61%

[Series 2: axial neck · axial · 0.64mm/px · z∈[-248,-104]mm · 4 of 121 slices shown]
[im 25/121  bone]
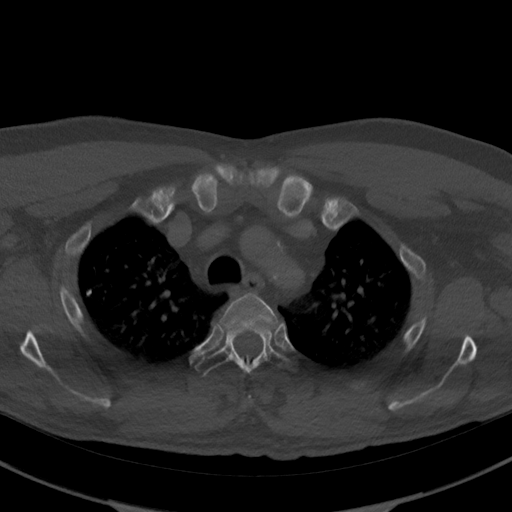
[im 49/121  bone]
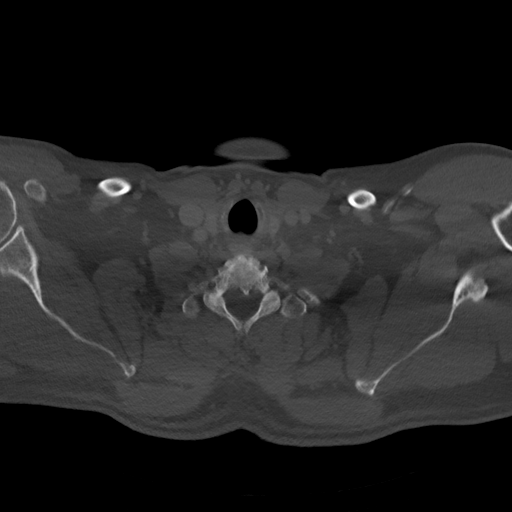
[im 73/121  bone]
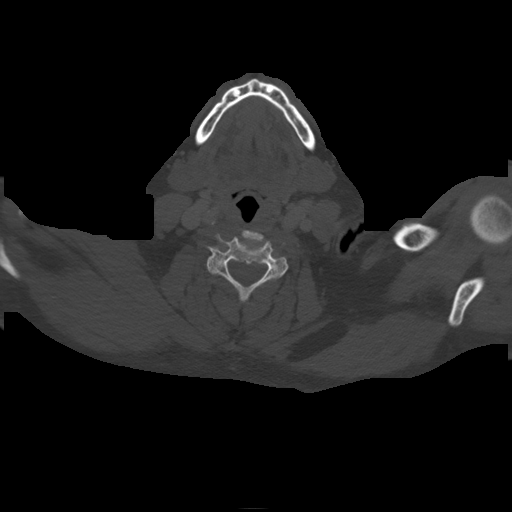
[im 97/121  bone]
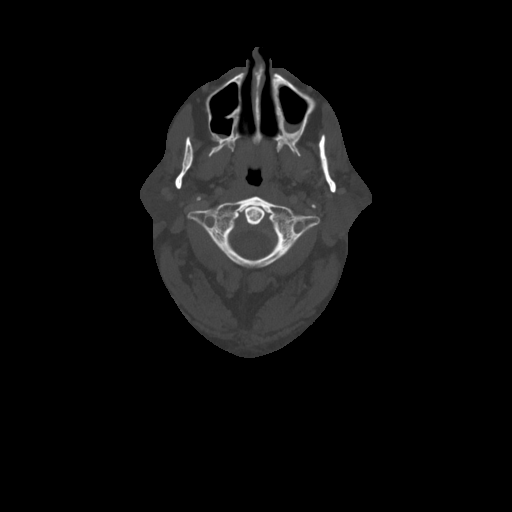

[Series 8: orthogonal ax · axial · 0.41mm/px · z∈[-289,-117]mm · 4 of 148 slices shown, 5 images]
[im 30/148  soft-tissue]
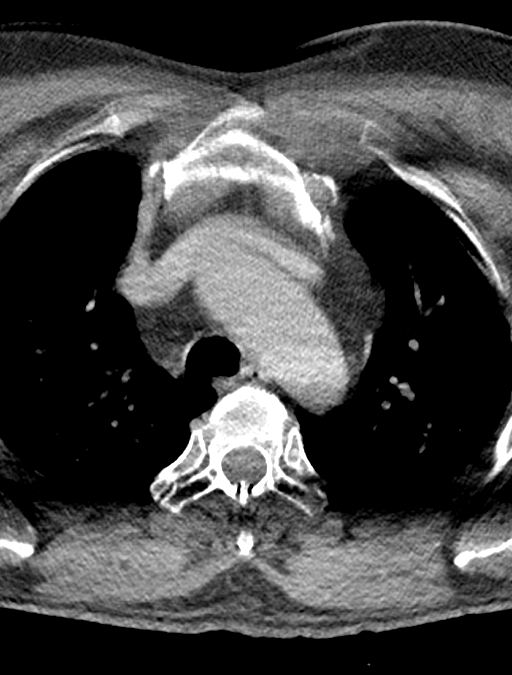
[im 30/148  bone]
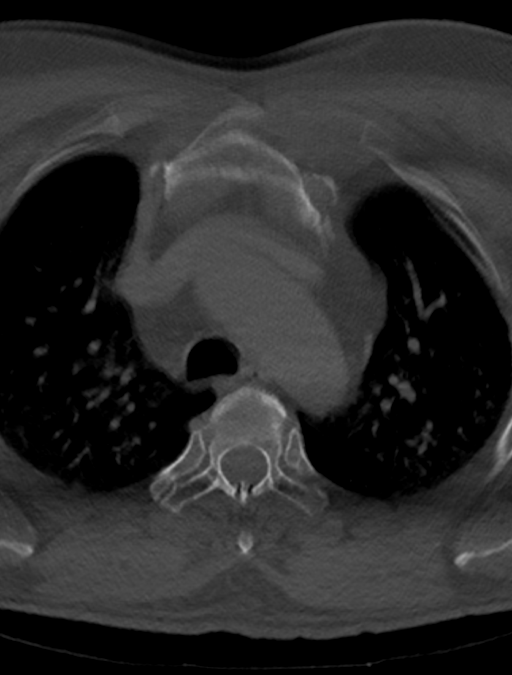
[im 59/148  bone]
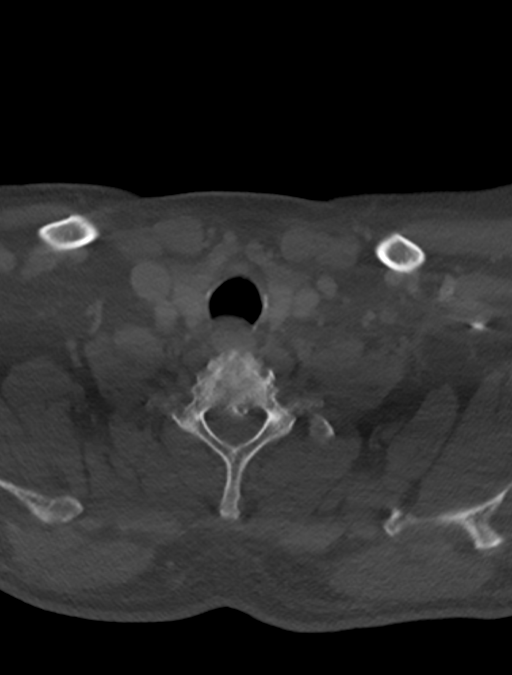
[im 89/148  bone]
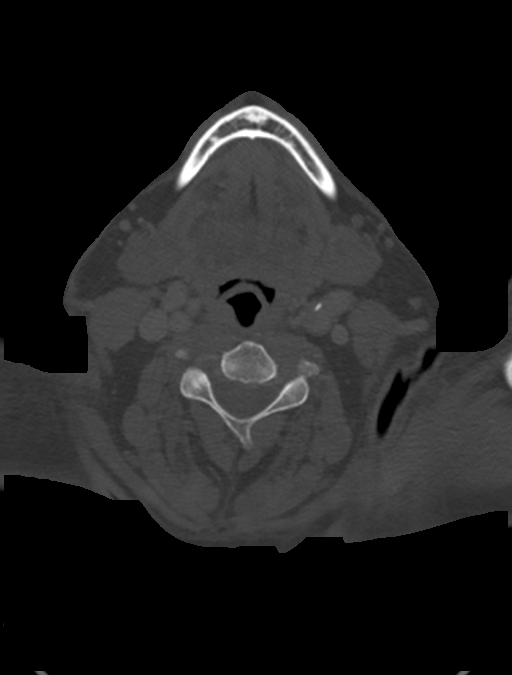
[im 118/148  bone]
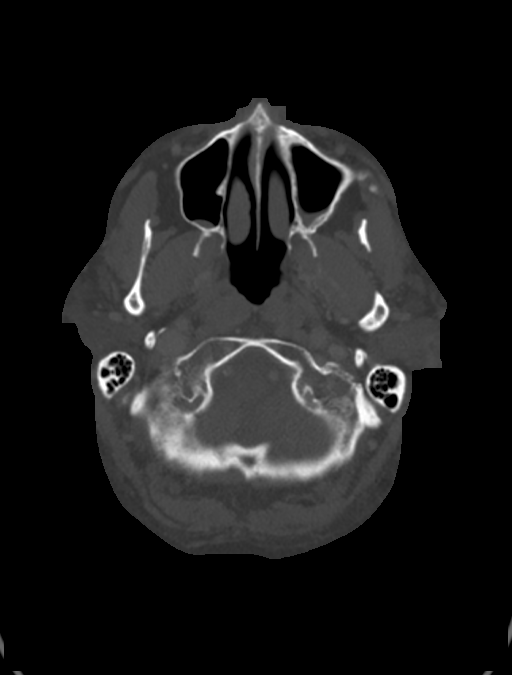

[8 of 14 positions shown; findings below may reference images not displayed]

FINDINGS: Pharynx and larynx: Negative larynx. The glottis is closed.
Pharyngeal soft tissue contours are within normal limits. Negative
parapharyngeal and retropharyngeal spaces.

Salivary glands: Sublingual space and submandibular glands are
within normal limits. The right parotid gland is normal.

Within the deep lobe of the left parotid, centered at the left
retromandibular vein, there is a round mildly hyperdense fairly
homogeneous appearing mass encompassing 25 x 27 x 28 mm (AP by
transverse by CC). See series 2, image 23 and coronal image 68. The
more superficial left parotid gland appears normal. No regional
lymphadenopathy. Normal CT appearance of the right stylomastoid
foramen.

Thyroid: Negative.

Lymph nodes: Negative.  No cervical lymphadenopathy.

Vascular: Major vascular structures in the neck and at the skullbase
are patent. Mild Calcified aortic atherosclerosis. Tortuous proximal
great vessels. Mild calcified carotid atherosclerosis.

Limited intracranial: Negative.

Visualized orbits: Negative.

Mastoids and visualized paranasal sinuses: Mild mucoperiosteal
thickening in the left maxillary sinus. Trace mucosal thickening in
the right maxillary sinus. Other visible paranasal sinuses and
mastoids are well pneumatized. Tympanic cavities are clear.

Skeleton: Chronic sclerosis of the left mandible condyles appears
degenerative in nature. Lower cervical spine disc and endplate
degeneration. No acute osseous abnormality identified.

Upper chest: Mild respiratory motion artifact. Superior mediastinal
lipomatosis, but no superior mediastinal lymphadenopathy. Incidental
small left trapezius intramuscular simple lipoma (series 2, image
48). No axillary lymphadenopathy. Negative visible lung parenchyma.
IMPRESSION: 1. Left parotid gland deep lobe 2.8 cm round mildly hyperdense soft
tissue nodule/mass. Favor benign neoplasm such as benign mixed tumor
of the parotid. No surrounding lymphadenopathy and normal CT
appearance of the left stylomastoid foramen.
2. Otherwise negative neck soft tissues.
3. Mild chronic left maxillary sinusitis. Mild Calcified aortic
atherosclerosis.

## 2018-03-15 ENCOUNTER — Encounter: Payer: Self-pay | Admitting: Internal Medicine

## 2018-03-15 ENCOUNTER — Ambulatory Visit (INDEPENDENT_AMBULATORY_CARE_PROVIDER_SITE_OTHER): Payer: 59 | Admitting: Internal Medicine

## 2018-03-15 VITALS — BP 140/78 | HR 62 | Temp 98.7°F | Ht 70.0 in | Wt 212.0 lb

## 2018-03-15 DIAGNOSIS — E785 Hyperlipidemia, unspecified: Secondary | ICD-10-CM | POA: Diagnosis not present

## 2018-03-15 DIAGNOSIS — Z0000 Encounter for general adult medical examination without abnormal findings: Secondary | ICD-10-CM

## 2018-03-15 DIAGNOSIS — Z23 Encounter for immunization: Secondary | ICD-10-CM

## 2018-03-15 DIAGNOSIS — I10 Essential (primary) hypertension: Secondary | ICD-10-CM | POA: Diagnosis not present

## 2018-03-15 DIAGNOSIS — I2510 Atherosclerotic heart disease of native coronary artery without angina pectoris: Secondary | ICD-10-CM | POA: Diagnosis not present

## 2018-03-15 NOTE — Assessment & Plan Note (Signed)
Doing well now Defer PSA to next year at least Colon due probably 2021---had many polyps Discussed fitness prevnar due to CAD Yearly flu vaccine

## 2018-03-15 NOTE — Progress Notes (Signed)
Subjective:    Patient ID: Jason Gross, male    DOB: [birth year], 62 y.o.   MRN: [id number]  HPI Here for physical  Finished the heart track program Had been going to the gym--then MIL got ill and schedule changed No symptoms Did have cardiology follow up with Dr [name]  Going for extraction tomorrow Dentist knows about the antiplatelet Rx  Current Outpatient Medications on File Prior to Visit  Medication Sig Dispense Refill  . aspirin EC 81 MG EC tablet Take 1 tablet (81 mg total) by mouth daily. 30 tablet 0  . atorvastatin (LIPITOR) 40 MG tablet Take 1 tablet (40 mg total) by mouth daily at 6 PM. 30 tablet 0  . cetirizine (ZYRTEC) 10 MG tablet Take 10 mg by mouth daily.      . clopidogrel (PLAVIX) 75 MG tablet Take 1 tablet (75 mg total) by mouth daily. 30 tablet 0  . ibuprofen (ADVIL,MOTRIN) 400 MG tablet Take 1 tablet (400 mg total) by mouth every 6 (six) hours as needed. 30 tablet 0  . lisinopril (PRINIVIL,ZESTRIL) 5 MG tablet Take 0.5 tablets (2.5 mg total) by mouth daily. 30 tablet 0  . metoprolol tartrate (LOPRESSOR) 25 MG tablet Take 1 tablet (25 mg total) by mouth 2 (two) times daily. 60 tablet 0   No current facility-administered medications on file prior to visit.     No Known Allergies  Past Medical History:  Diagnosis Date  . Allergy   . Benign mass of parotid gland   . GERD (gastroesophageal reflux disease)   . Hyperlipidemia    not on meds  . Hypertension   . Vertigo     Past Surgical History:  Procedure Laterality Date  . LEFT HEART CATH AND CORONARY ANGIOGRAPHY N/A 06/30/2017   Procedure: LEFT HEART CATH AND CORONARY ANGIOGRAPHY;  Surgeon: Nelva Bush, MD;  Location: [facility];  Service: Cardiovascular;  Laterality: N/A;  . PAROTIDECTOMY Left 01/07/2017   Procedure: PAROTIDECTOMY (SUPERFICIAL);  Surgeon: Clyde Canterbury, MD;  Location: [facility];  Service: ENT;  Laterality: Left;    Family History  Problem Relation Age of Onset  .  Healthy Mother   . Coronary artery disease Father   . Diabetes Sister   . Diabetes Maternal Uncle   . Coronary artery disease Maternal Grandfather   . Diabetes Maternal Grandfather   . Esophageal cancer Paternal Uncle   . Hypertension Neg Hx   . Cancer Neg Hx   . Colon cancer Neg Hx   . Colon polyps Neg Hx   . Rectal cancer Neg Hx   . Stomach cancer Neg Hx     Social History   Socioeconomic History  . Marital status: Married    Spouse name: Not on file  . Number of children: 1  . Years of education: Not on file  . Highest education level: Not on file  Occupational History  . Occupation: Furniture conservator/restorer @ [employer]  . Financial resource strain: Not on file  . Food insecurity:    Worry: Not on file    Inability: Not on file  . Transportation needs:    Medical: Not on file    Non-medical: Not on file  Tobacco Use  . Smoking status: Former Smoker    Last attempt to quit: 07/28/1968    Years since quitting: 49.6  . Smokeless tobacco: Never Used  Substance and Sexual Activity  . Alcohol use: No    Comment: occasional  . Drug  use: No  . Sexual activity: Yes  Lifestyle  . Physical activity:    Days per week: Not on file    Minutes per session: Not on file  . Stress: Not on file  Relationships  . Social connections:    Talks on phone: Not on file    Gets together: Not on file    Attends religious service: Not on file    Active member of club or organization: Not on file    Attends meetings of clubs or organizations: Not on file    Relationship status: Not on file  . Intimate partner violence:    Fear of current or ex partner: Not on file    Emotionally abused: Not on file    Physically abused: Not on file    Forced sexual activity: Not on file  Other Topics Concern  . Not on file  Social History Narrative  . Not on file     Review of Systems  Constitutional: Negative for fatigue and unexpected weight change.       Wears seat belt  HENT: Positive for  dental problem and hearing loss. Negative for tinnitus and trouble swallowing.   Eyes: Negative for visual disturbance.       No diplopia or unilateral vision loss  Respiratory: Negative for cough, chest tightness and shortness of breath.   Cardiovascular: Negative for chest pain, palpitations and leg swelling.  Gastrointestinal: Negative for blood in stool and constipation.       Rare heartburn--no meds  Endocrine: Negative for polydipsia and polyuria.  Genitourinary: Negative for difficulty urinating and urgency.       No sexual problems  Musculoskeletal: Negative for arthralgias, back pain and joint swelling.  Skin:       Has ongoing lesion on right forehead Spot on left leg also  Allergic/Immunologic: Positive for environmental allergies. Negative for immunocompromised state.       Zyrtec helps  Neurological: Negative for dizziness, syncope, light-headedness and headaches.  Hematological: Negative for adenopathy. Bruises/bleeds easily.  Psychiatric/Behavioral: Negative for sleep disturbance. The patient is not nervous/anxious.        Objective:   Physical Exam  Constitutional: He is oriented to person, place, and time. He appears well-developed. No distress.  HENT:  Head: Normocephalic and atraumatic.  Right Ear: External ear normal.  Left Ear: External ear normal.  Mouth/Throat: Oropharynx is clear and moist. No oropharyngeal exudate.  Eyes: Pupils are equal, round, and reactive to light. Conjunctivae are normal.  Neck: No thyromegaly present.  Cardiovascular: Normal rate, regular rhythm, normal heart sounds and intact distal pulses. Exam reveals no gallop.  No murmur heard. Respiratory: Effort normal and breath sounds normal. No respiratory distress. He has no wheezes. He has no rales.  GI: Soft. There is no tenderness.  Musculoskeletal: He exhibits no edema or tenderness.  Lymphadenopathy:    He has no cervical adenopathy.  Neurological: He is alert and oriented to  person, place, and time.  Skin: No rash noted. No erythema.  Pale irregular lesion on left mid forehead Inflamed spot on lateral left thigh Recommended derm visit  Psychiatric: He has a normal mood and affect. His behavior is normal.           Assessment & Plan:

## 2018-03-15 NOTE — Assessment & Plan Note (Signed)
BP Readings from Last 3 Encounters:  03/15/18 140/78  07/16/17 118/76  07/14/17 (!) 117/55   Reasonable control

## 2018-03-15 NOTE — Patient Instructions (Signed)
I would recommend [facility] in [place] for dermatology

## 2018-03-15 NOTE — Assessment & Plan Note (Signed)
No problems with statin 

## 2018-03-15 NOTE — Addendum Note (Signed)
Addended by: Jacqualin Combes on: 03/15/2018 05:14 PM   Modules accepted: Orders

## 2018-03-15 NOTE — Assessment & Plan Note (Signed)
S/P cardiac arrest and cath Medical therapy No symptoms now

## 2018-03-16 LAB — CBC
HCT: 42.5 % (ref 39.0–52.0)
HEMOGLOBIN: 14.8 g/dL (ref 13.0–17.0)
MCHC: 34.9 g/dL (ref 30.0–36.0)
MCV: 84.6 fl (ref 78.0–100.0)
Platelets: 200 10*3/uL (ref 150.0–400.0)
RBC: 5.03 Mil/uL (ref 4.22–5.81)
RDW: 14 % (ref 11.5–15.5)
WBC: 7.3 10*3/uL (ref 4.0–10.5)

## 2018-03-16 LAB — LIPID PANEL
Cholesterol: 99 mg/dL (ref 0–200)
HDL: 31.9 mg/dL — ABNORMAL LOW (ref >39.00)
LDL Cholesterol: 42 mg/dL (ref 0–99)
NONHDL: 67.01
Total CHOL/HDL Ratio: 3
Triglycerides: 126 mg/dL (ref 0.0–149.0)
VLDL: 25.2 mg/dL (ref 0.0–40.0)

## 2018-03-16 LAB — COMPREHENSIVE METABOLIC PANEL
ALBUMIN: 4.5 g/dL (ref 3.5–5.2)
ALK PHOS: 58 U/L (ref 39–117)
ALT: 29 U/L (ref 0–53)
AST: 19 U/L (ref 0–37)
BUN: 15 mg/dL (ref 6–23)
CHLORIDE: 103 meq/L (ref 96–112)
CO2: 30 mEq/L (ref 19–32)
CREATININE: 0.99 mg/dL (ref 0.40–1.50)
Calcium: 9.8 mg/dL (ref 8.4–10.5)
GFR: 81.67 mL/min (ref >60.00)
GLUCOSE: 89 mg/dL (ref 70–99)
Potassium: 4.2 mEq/L (ref 3.5–5.1)
SODIUM: 140 meq/L (ref 135–145)
TOTAL PROTEIN: 7.3 g/dL (ref 6.0–8.3)
Total Bilirubin: 0.7 mg/dL (ref 0.2–1.2)

## 2018-03-16 LAB — T4, FREE: FREE T4: 0.66 ng/dL (ref 0.60–1.60)

## 2019-02-07 DIAGNOSIS — I2510 Atherosclerotic heart disease of native coronary artery without angina pectoris: Secondary | ICD-10-CM | POA: Diagnosis not present

## 2019-02-07 DIAGNOSIS — E782 Mixed hyperlipidemia: Secondary | ICD-10-CM | POA: Diagnosis not present

## 2019-02-07 DIAGNOSIS — R001 Bradycardia, unspecified: Secondary | ICD-10-CM | POA: Diagnosis not present

## 2019-02-07 DIAGNOSIS — I10 Essential (primary) hypertension: Secondary | ICD-10-CM | POA: Diagnosis not present

## 2019-02-14 DIAGNOSIS — E782 Mixed hyperlipidemia: Secondary | ICD-10-CM | POA: Diagnosis not present

## 2019-03-21 ENCOUNTER — Other Ambulatory Visit: Payer: Self-pay

## 2019-03-21 ENCOUNTER — Encounter: Payer: Self-pay | Admitting: Internal Medicine

## 2019-03-21 ENCOUNTER — Ambulatory Visit (INDEPENDENT_AMBULATORY_CARE_PROVIDER_SITE_OTHER): Payer: BC Managed Care – PPO | Admitting: Internal Medicine

## 2019-03-21 VITALS — BP 140/72 | HR 63 | Temp 98.4°F | Ht 70.0 in | Wt 216.0 lb

## 2019-03-21 DIAGNOSIS — Z125 Encounter for screening for malignant neoplasm of prostate: Secondary | ICD-10-CM

## 2019-03-21 DIAGNOSIS — I2510 Atherosclerotic heart disease of native coronary artery without angina pectoris: Secondary | ICD-10-CM | POA: Diagnosis not present

## 2019-03-21 DIAGNOSIS — I10 Essential (primary) hypertension: Secondary | ICD-10-CM | POA: Diagnosis not present

## 2019-03-21 DIAGNOSIS — Z0000 Encounter for general adult medical examination without abnormal findings: Secondary | ICD-10-CM

## 2019-03-21 DIAGNOSIS — Z23 Encounter for immunization: Secondary | ICD-10-CM | POA: Diagnosis not present

## 2019-03-21 MED ORDER — ATORVASTATIN CALCIUM 40 MG PO TABS: 40.0000 mg | ORAL_TABLET | Freq: Every day | ORAL | 3 refills | Status: DC

## 2019-03-21 NOTE — Assessment & Plan Note (Signed)
Needs to get back to exercise Flu vaccine today Discussed PSA---will check Colon probably due next year (multiple polyps in 2018)

## 2019-03-21 NOTE — Assessment & Plan Note (Signed)
BP Readings from Last 3 Encounters:  03/21/19 140/72  03/15/18 140/78  07/16/17 118/76   Reasonable control

## 2019-03-21 NOTE — Assessment & Plan Note (Signed)
No angina On appropriate regimen DAPT still per cardiologist

## 2019-03-21 NOTE — Progress Notes (Signed)
Subjective:    Patient ID: Jason Gross, male    DOB: 1955-08-08, 63 y.o.   MRN: [id number]  HPI Here for physical  No new concerns Job is "wide open" Dr [name] changed his medications and schedule--to avoid heat response at work after lunch especially  Not really exercising much--can't get into gym  Current Outpatient Medications on File Prior to Visit  Medication Sig Dispense Refill  . aspirin EC 81 MG EC tablet Take 1 tablet (81 mg total) by mouth daily. 30 tablet 0  . atorvastatin (LIPITOR) 40 MG tablet Take 1 tablet (40 mg total) by mouth daily at 6 PM. 30 tablet 0  . cetirizine (ZYRTEC) 10 MG tablet Take 10 mg by mouth daily.      . clopidogrel (PLAVIX) 75 MG tablet Take 1 tablet (75 mg total) by mouth daily. 30 tablet 0  . ibuprofen (ADVIL,MOTRIN) 400 MG tablet Take 1 tablet (400 mg total) by mouth every 6 (six) hours as needed. 30 tablet 0  . metoprolol tartrate (LOPRESSOR) 25 MG tablet Take 1 tablet (25 mg total) by mouth 2 (two) times daily. 60 tablet 0  . lisinopril (PRINIVIL,ZESTRIL) 5 MG tablet Take 0.5 tablets (2.5 mg total) by mouth daily. 30 tablet 0   No current facility-administered medications on file prior to visit.     No Known Allergies  Past Medical History:  Diagnosis Date  . Allergy   . Benign mass of parotid gland   . GERD (gastroesophageal reflux disease)   . Hyperlipidemia    not on meds  . Hypertension   . Vertigo     Past Surgical History:  Procedure Laterality Date  . LEFT HEART CATH AND CORONARY ANGIOGRAPHY N/A 06/30/2017   Procedure: LEFT HEART CATH AND CORONARY ANGIOGRAPHY;  Surgeon: Nelva Bush, MD;  Location: [facility];  Service: Cardiovascular;  Laterality: N/A;  . PAROTIDECTOMY Left 01/07/2017   Procedure: PAROTIDECTOMY (SUPERFICIAL);  Surgeon: Clyde Canterbury, MD;  Location: [facility];  Service: ENT;  Laterality: Left;    Family History  Problem Relation Age of Onset  . Healthy Mother   . Coronary artery disease  Father   . Diabetes Sister   . Diabetes Maternal Uncle   . Coronary artery disease Maternal Grandfather   . Diabetes Maternal Grandfather   . Esophageal cancer Paternal Uncle   . Hypertension Neg Hx   . Cancer Neg Hx   . Colon cancer Neg Hx   . Colon polyps Neg Hx   . Rectal cancer Neg Hx   . Stomach cancer Neg Hx     Social History   Socioeconomic History  . Marital status: Married    Spouse name: Not on file  . Number of children: 1  . Years of education: Not on file  . Highest education level: Not on file  Occupational History  . 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 Financial resource strain: Not on file  . Food insecurity    Worry: Not on file    Inability: Not on file  . Transportation needs    Medical: Not on file    Non-medical: Not on file  Tobacco Use  . Smoking status: Former Smoker    Quit date: 07/28/1968    Years since quitting: 50.6  . Smokeless tobacco: Never Used  Substance and Sexual Activity  . Alcohol use: No    Comment: occasional  . Drug use: No  . Sexual activity: Yes  Lifestyle  .  Physical activity    Days per week: Not on file    Minutes per session: Not on file  . Stress: Not on file  Relationships  . Social Herbalist on phone: Not on file    Gets together: Not on file    Attends religious service: Not on file    Active member of club or organization: Not on file    Attends meetings of clubs or organizations: Not on file    Relationship status: Not on file  . Intimate partner violence    Fear of current or ex partner: Not on file    Emotionally abused: Not on file    Physically abused: Not on file    Forced sexual activity: Not on file  Other Topics Concern  . Not on file  Social History Narrative  . Not on file   Review of Systems  Constitutional: Negative for fatigue.       Gained some weight back Wears seat belt  HENT: Negative for tinnitus.        Left side hearing loss is stable Keeps up with  dentist--filling needs replacing  Eyes: Negative for visual disturbance.       No diplopia or unilateral vision loss  Respiratory: Negative for chest tightness and shortness of breath.        Some cough from wearing mask  Cardiovascular: Negative for chest pain, palpitations and leg swelling.  Gastrointestinal: Negative for abdominal pain, blood in stool and constipation.       No heartburn  Endocrine: Negative for polydipsia and polyuria.  Genitourinary: Negative for difficulty urinating and urgency.       No sexual problems  Musculoskeletal: Negative for arthralgias, back pain and joint swelling.  Skin: Negative for rash.       No suspicious lesions  Allergic/Immunologic: Positive for environmental allergies. Negative for immunocompromised state.       Satisfied with OTC med  Neurological: Negative for syncope and headaches.       Occ dizziness if he moves too fast  Hematological: Negative for adenopathy. Bruises/bleeds easily.  Psychiatric/Behavioral: Negative for dysphoric mood and sleep disturbance. The patient is not nervous/anxious.        Objective:   Physical Exam  Constitutional: He is oriented to person, place, and time. He appears well-developed. No distress.  HENT:  Head: Normocephalic and atraumatic.  Right Ear: External ear normal.  Left Ear: External ear normal.  Mouth/Throat: Oropharynx is clear and moist. No oropharyngeal exudate.  Eyes: Pupils are equal, round, and reactive to light. Conjunctivae are normal.  Neck: No thyromegaly present.  Cardiovascular: Normal rate, regular rhythm, normal heart sounds and intact distal pulses. Exam reveals no gallop.  No murmur heard. Respiratory: Effort normal and breath sounds normal. No respiratory distress. He has no wheezes. He has no rales.  GI: Soft. There is no abdominal tenderness.  Musculoskeletal:        General: No tenderness or edema.  Lymphadenopathy:    He has no cervical adenopathy.  Neurological: He is  alert and oriented to person, place, and time.  Skin: No rash noted. No erythema.  Psychiatric: He has a normal mood and affect. His behavior is normal.           Assessment & Plan:

## 2019-03-21 NOTE — Addendum Note (Signed)
Addended by: Pilar Grammes on: 03/21/2019 05:07 PM   Modules accepted: Orders

## 2019-03-22 LAB — CBC
HCT: 42.2 % (ref 39.0–52.0)
Hemoglobin: 14.7 g/dL (ref 13.0–17.0)
MCHC: 34.7 g/dL (ref 30.0–36.0)
MCV: 85.3 fl (ref 78.0–100.0)
Platelets: 199 10*3/uL (ref 150.0–400.0)
RBC: 4.96 Mil/uL (ref 4.22–5.81)
RDW: 13.5 % (ref 11.5–15.5)
WBC: 8.1 10*3/uL (ref 4.0–10.5)

## 2019-03-22 LAB — RENAL FUNCTION PANEL
Albumin: 4.7 g/dL (ref 3.5–5.2)
BUN: 11 mg/dL (ref 6–23)
CO2: 29 mEq/L (ref 19–32)
Calcium: 9.4 mg/dL (ref 8.4–10.5)
Chloride: 103 mEq/L (ref 96–112)
Creatinine, Ser: 0.9 mg/dL (ref 0.40–1.50)
GFR: 85.49 mL/min (ref >60.00)
Glucose, Bld: 100 mg/dL — ABNORMAL HIGH (ref 70–99)
Phosphorus: 3.7 mg/dL (ref 2.3–4.6)
Potassium: 4.4 mEq/L (ref 3.5–5.1)
Sodium: 140 mEq/L (ref 135–145)

## 2019-03-22 LAB — PSA: PSA: 0.59 ng/mL (ref 0.10–4.00)

## 2019-04-13 DIAGNOSIS — E782 Mixed hyperlipidemia: Secondary | ICD-10-CM | POA: Diagnosis not present

## 2019-04-13 DIAGNOSIS — I10 Essential (primary) hypertension: Secondary | ICD-10-CM | POA: Diagnosis not present

## 2019-04-13 DIAGNOSIS — I2510 Atherosclerotic heart disease of native coronary artery without angina pectoris: Secondary | ICD-10-CM | POA: Diagnosis not present

## 2019-06-01 DIAGNOSIS — I2510 Atherosclerotic heart disease of native coronary artery without angina pectoris: Secondary | ICD-10-CM | POA: Diagnosis not present

## 2019-06-01 DIAGNOSIS — E782 Mixed hyperlipidemia: Secondary | ICD-10-CM | POA: Diagnosis not present

## 2019-06-01 DIAGNOSIS — R001 Bradycardia, unspecified: Secondary | ICD-10-CM | POA: Diagnosis not present

## 2019-06-01 DIAGNOSIS — I10 Essential (primary) hypertension: Secondary | ICD-10-CM | POA: Diagnosis not present

## 2019-08-01 DIAGNOSIS — I2510 Atherosclerotic heart disease of native coronary artery without angina pectoris: Secondary | ICD-10-CM | POA: Diagnosis not present

## 2019-08-01 DIAGNOSIS — E782 Mixed hyperlipidemia: Secondary | ICD-10-CM | POA: Diagnosis not present

## 2019-08-01 DIAGNOSIS — R001 Bradycardia, unspecified: Secondary | ICD-10-CM | POA: Diagnosis not present

## 2019-08-01 DIAGNOSIS — I10 Essential (primary) hypertension: Secondary | ICD-10-CM | POA: Diagnosis not present

## 2019-11-25 ENCOUNTER — Other Ambulatory Visit: Payer: Self-pay | Admitting: Internal Medicine

## 2020-02-07 DIAGNOSIS — E782 Mixed hyperlipidemia: Secondary | ICD-10-CM | POA: Diagnosis not present

## 2020-02-09 DIAGNOSIS — E782 Mixed hyperlipidemia: Secondary | ICD-10-CM | POA: Diagnosis not present

## 2020-02-09 DIAGNOSIS — I2510 Atherosclerotic heart disease of native coronary artery without angina pectoris: Secondary | ICD-10-CM | POA: Diagnosis not present

## 2020-02-09 DIAGNOSIS — I10 Essential (primary) hypertension: Secondary | ICD-10-CM | POA: Diagnosis not present

## 2020-03-21 ENCOUNTER — Other Ambulatory Visit: Payer: Self-pay

## 2020-03-21 ENCOUNTER — Encounter: Payer: Self-pay | Admitting: Internal Medicine

## 2020-03-21 ENCOUNTER — Ambulatory Visit (INDEPENDENT_AMBULATORY_CARE_PROVIDER_SITE_OTHER): Payer: BC Managed Care – PPO | Admitting: Internal Medicine

## 2020-03-21 VITALS — BP 124/82 | HR 51 | Temp 98.2°F | Ht 70.0 in | Wt 220.4 lb

## 2020-03-21 DIAGNOSIS — I2510 Atherosclerotic heart disease of native coronary artery without angina pectoris: Secondary | ICD-10-CM | POA: Diagnosis not present

## 2020-03-21 DIAGNOSIS — I10 Essential (primary) hypertension: Secondary | ICD-10-CM

## 2020-03-21 DIAGNOSIS — Z0000 Encounter for general adult medical examination without abnormal findings: Secondary | ICD-10-CM

## 2020-03-21 LAB — RENAL FUNCTION PANEL
Albumin: 4.3 g/dL (ref 3.5–5.2)
BUN: 15 mg/dL (ref 6–23)
CO2: 31 mEq/L (ref 19–32)
Calcium: 9.4 mg/dL (ref 8.4–10.5)
Chloride: 102 mEq/L (ref 96–112)
Creatinine, Ser: 0.93 mg/dL (ref 0.40–1.50)
GFR: 82.05 mL/min (ref >60.00)
Glucose, Bld: 112 mg/dL — ABNORMAL HIGH (ref 70–99)
Phosphorus: 3.5 mg/dL (ref 2.3–4.6)
Potassium: 4.7 mEq/L (ref 3.5–5.1)
Sodium: 138 mEq/L (ref 135–145)

## 2020-03-21 LAB — CBC
HCT: 42.9 % (ref 39.0–52.0)
Hemoglobin: 14.9 g/dL (ref 13.0–17.0)
MCHC: 34.8 g/dL (ref 30.0–36.0)
MCV: 84.8 fl (ref 78.0–100.0)
Platelets: 208 10*3/uL (ref 150.0–400.0)
RBC: 5.06 Mil/uL (ref 4.22–5.81)
RDW: 13.8 % (ref 11.5–15.5)
WBC: 7.8 10*3/uL (ref 4.0–10.5)

## 2020-03-21 MED ORDER — METOPROLOL TARTRATE 25 MG PO TABS: 12.5000 mg | ORAL_TABLET | Freq: Two times a day (BID) | ORAL | 0 refills | Status: DC

## 2020-03-21 NOTE — Assessment & Plan Note (Signed)
BP Readings from Last 3 Encounters:  03/21/20 124/82  03/21/19 140/72  03/15/18 140/78   Good control

## 2020-03-21 NOTE — Assessment & Plan Note (Signed)
Discussed fitness Urged him to get COVID vaccine Flu vaccine in the fall Colon due in November Defer PSA to next year

## 2020-03-21 NOTE — Progress Notes (Signed)
Subjective:    Patient ID: Jason Gross, male    DOB: [birth year], 64 y.o.   MRN: [id number]  HPI Here for physical This visit occurred during the SARS-CoV-2 public health emergency.  Safety protocols were in place, including screening questions prior to the visit, additional usage of staff PPE, and extensive cleaning of exam room while observing appropriate contact time as indicated for disinfecting solutions.   Doing fine No heart problems--keeps up with the cardiologist Same job--- very hot (A/C is broken)  Current Outpatient Medications on File Prior to Visit  Medication Sig Dispense Refill  . amLODipine (NORVASC) 10 MG tablet Take 10 mg by mouth daily.    Marland Kitchen aspirin EC 81 MG EC tablet Take 1 tablet (81 mg total) by mouth daily. 30 tablet 0  . atorvastatin (LIPITOR) 40 MG tablet Take 1 tablet (40 mg total) by mouth daily at 6 PM. (Patient taking differently: Take 80 mg by mouth daily at 6 PM. ) 90 tablet 3  . cetirizine (ZYRTEC) 10 MG tablet Take 10 mg by mouth daily.      . metoprolol tartrate (LOPRESSOR) 25 MG tablet Take 1 tablet (25 mg total) by mouth 2 (two) times daily. (Patient taking differently: Take 12.5 mg by mouth 2 (two) times daily. ) 60 tablet 0  . clopidogrel (PLAVIX) 75 MG tablet Take 1 tablet (75 mg total) by mouth daily. (Patient not taking: Reported on 03/21/2020) 30 tablet 0  . ibuprofen (ADVIL,MOTRIN) 400 MG tablet Take 1 tablet (400 mg total) by mouth every 6 (six) hours as needed. (Patient not taking: Reported on 03/21/2020) 30 tablet 0  . lisinopril (PRINIVIL,ZESTRIL) 5 MG tablet Take 0.5 tablets (2.5 mg total) by mouth daily. (Patient taking differently: Take 20 mg by mouth daily. ) 30 tablet 0   No current facility-administered medications on file prior to visit.    No Known Allergies  Past Medical History:  Diagnosis Date  . Allergy   . Benign mass of parotid gland   . GERD (gastroesophageal reflux disease)   . Hyperlipidemia    not on meds  .  Hypertension   . Vertigo     Past Surgical History:  Procedure Laterality Date  . LEFT HEART CATH AND CORONARY ANGIOGRAPHY N/A 06/30/2017   Procedure: LEFT HEART CATH AND CORONARY ANGIOGRAPHY;  Surgeon: Nelva Bush, MD;  Location: [facility];  Service: Cardiovascular;  Laterality: N/A;  . PAROTIDECTOMY Left 01/07/2017   Procedure: PAROTIDECTOMY (SUPERFICIAL);  Surgeon: Clyde Canterbury, MD;  Location: [facility];  Service: ENT;  Laterality: Left;    Family History  Problem Relation Age of Onset  . Healthy Mother   . Coronary artery disease Father   . Diabetes Sister   . Diabetes Maternal Uncle   . Coronary artery disease Maternal Grandfather   . Diabetes Maternal Grandfather   . Esophageal cancer Paternal Uncle   . Hypertension Neg Hx   . Cancer Neg Hx   . Colon cancer Neg Hx   . Colon polyps Neg Hx   . Rectal cancer Neg Hx   . Stomach cancer Neg Hx     Social History   Socioeconomic History  . Marital status: Married    Spouse name: Not on file  . Number of children: 1  . Years of education: Not on file  . Highest education level: Not on file  Occupational History  . Occupation: Furniture conservator/restorer @ [employer]  . Smoking status: Former Smoker  Quit date: 07/28/1968    Years since quitting: 51.6  . Smokeless tobacco: Never Used  Vaping Use  . Vaping Use: Never used  Substance and Sexual Activity  . Alcohol use: No    Comment: occasional  . Drug use: No  . Sexual activity: Yes  Other Topics Concern  . Not on file  Social History Narrative  . Not on file   Social Determinants of Health   Financial Resource Strain:   . Difficulty of Paying Living Expenses: Not on file  Food Insecurity:   . Worried About Charity fundraiser in the Last Year: Not on file  . Ran Out of Food in the Last Year: Not on file  Transportation Needs:   . Lack of Transportation (Medical): Not on file  . Lack of Transportation (Non-Medical): Not on file  Physical Activity:    . Days of Exercise per Week: Not on file  . Minutes of Exercise per Session: Not on file  Stress:   . Feeling of Stress : Not on file  Social Connections:   . Frequency of Communication with Friends and Family: Not on file  . Frequency of Social Gatherings with Friends and Family: Not on file  . Attends Religious Services: Not on file  . Active Member of Clubs or Organizations: Not on file  . Attends Archivist Meetings: Not on file  . Marital Status: Not on file  Intimate Partner Violence:   . Fear of Current or Ex-Partner: Not on file  . Emotionally Abused: Not on file  . Physically Abused: Not on file  . Sexually Abused: Not on file   Review of Systems  Constitutional: Negative for fatigue.       Weight is up a few pounds. No set exercise Wears seat belt  HENT: Positive for tinnitus. Negative for dental problem, hearing loss and trouble swallowing.        Keeps up with dentist  Eyes: Negative for visual disturbance.       No diplopia or unilateral vision loss  Respiratory: Negative for cough, chest tightness and shortness of breath.   Cardiovascular: Negative for chest pain, palpitations and leg swelling.  Gastrointestinal: Negative for blood in stool and constipation.       Rare heartburn  Endocrine: Negative for polydipsia and polyuria.  Genitourinary: Negative for dysuria and urgency.       No sexual problems  Musculoskeletal: Positive for back pain. Negative for arthralgias and joint swelling.       Uses ibuprofen occasionally  Skin:       Still with spot on the top of his head---may be bigger?  Allergic/Immunologic: Positive for environmental allergies. Negative for immunocompromised state.       Cetirizine helps  Neurological: Negative for dizziness, syncope and light-headedness.       Rare headaches  Hematological: Negative for adenopathy. Does not bruise/bleed easily.  Psychiatric/Behavioral: Negative for dysphoric mood and sleep disturbance.        Objective:   Physical Exam Constitutional:      Appearance: Normal appearance.  HENT:     Head: Normocephalic and atraumatic.     Right Ear: External ear normal.     Left Ear: External ear normal.     Mouth/Throat:     Comments: No lesions Eyes:     Conjunctiva/sclera: Conjunctivae normal.     Pupils: Pupils are equal, round, and reactive to light.  Cardiovascular:     Rate and Rhythm: Normal rate  and regular rhythm.     Pulses: Normal pulses.     Heart sounds: No murmur heard.  No gallop.   Pulmonary:     Effort: Pulmonary effort is normal.     Breath sounds: Normal breath sounds. No wheezing or rales.  Abdominal:     Palpations: Abdomen is soft.     Tenderness: There is no abdominal tenderness.  Musculoskeletal:     Cervical back: Neck supple.     Right lower leg: No edema.     Left lower leg: No edema.  Lymphadenopathy:     Cervical: No cervical adenopathy.  Skin:    Findings: No rash.     Comments: Scaly spot on vertex---advised going to derm for visit (though not overly worrisome) Multiple moles on back  Neurological:     General: No focal deficit present.     Mental Status: He is alert and oriented to person, place, and time.  Psychiatric:        Mood and Affect: Mood normal.        Behavior: Behavior normal.            Assessment & Plan:

## 2020-03-21 NOTE — Assessment & Plan Note (Signed)
Doing well on ACEI, beta blocker, statin and ASA

## 2020-03-23 DIAGNOSIS — D485 Neoplasm of uncertain behavior of skin: Secondary | ICD-10-CM | POA: Diagnosis not present

## 2020-03-23 DIAGNOSIS — D2271 Melanocytic nevi of right lower limb, including hip: Secondary | ICD-10-CM | POA: Diagnosis not present

## 2020-03-23 DIAGNOSIS — X32XXXA Exposure to sunlight, initial encounter: Secondary | ICD-10-CM | POA: Diagnosis not present

## 2020-03-23 DIAGNOSIS — L570 Actinic keratosis: Secondary | ICD-10-CM | POA: Diagnosis not present

## 2020-03-23 DIAGNOSIS — D2261 Melanocytic nevi of right upper limb, including shoulder: Secondary | ICD-10-CM | POA: Diagnosis not present

## 2020-03-23 DIAGNOSIS — D2262 Melanocytic nevi of left upper limb, including shoulder: Secondary | ICD-10-CM | POA: Diagnosis not present

## 2020-03-23 DIAGNOSIS — D225 Melanocytic nevi of trunk: Secondary | ICD-10-CM | POA: Diagnosis not present

## 2020-03-23 DIAGNOSIS — B079 Viral wart, unspecified: Secondary | ICD-10-CM | POA: Diagnosis not present

## 2020-03-26 ENCOUNTER — Encounter: Payer: BC Managed Care – PPO | Admitting: Internal Medicine

## 2020-08-27 DIAGNOSIS — R9431 Abnormal electrocardiogram [ECG] [EKG]: Secondary | ICD-10-CM | POA: Diagnosis not present

## 2020-08-27 DIAGNOSIS — E782 Mixed hyperlipidemia: Secondary | ICD-10-CM | POA: Diagnosis not present

## 2020-08-27 DIAGNOSIS — I2510 Atherosclerotic heart disease of native coronary artery without angina pectoris: Secondary | ICD-10-CM | POA: Diagnosis not present

## 2020-08-27 DIAGNOSIS — I10 Essential (primary) hypertension: Secondary | ICD-10-CM | POA: Diagnosis not present

## 2020-09-11 ENCOUNTER — Encounter: Payer: Self-pay | Admitting: Gastroenterology

## 2020-10-03 DIAGNOSIS — R9431 Abnormal electrocardiogram [ECG] [EKG]: Secondary | ICD-10-CM | POA: Diagnosis not present

## 2020-10-03 DIAGNOSIS — I2510 Atherosclerotic heart disease of native coronary artery without angina pectoris: Secondary | ICD-10-CM | POA: Diagnosis not present

## 2020-10-08 DIAGNOSIS — R001 Bradycardia, unspecified: Secondary | ICD-10-CM | POA: Diagnosis not present

## 2020-10-08 DIAGNOSIS — I2510 Atherosclerotic heart disease of native coronary artery without angina pectoris: Secondary | ICD-10-CM | POA: Diagnosis not present

## 2020-10-08 DIAGNOSIS — I10 Essential (primary) hypertension: Secondary | ICD-10-CM | POA: Diagnosis not present

## 2021-03-25 ENCOUNTER — Encounter: Payer: Self-pay | Admitting: Internal Medicine

## 2021-03-25 ENCOUNTER — Ambulatory Visit (INDEPENDENT_AMBULATORY_CARE_PROVIDER_SITE_OTHER): Payer: BC Managed Care – PPO | Admitting: Internal Medicine

## 2021-03-25 ENCOUNTER — Other Ambulatory Visit: Payer: Self-pay

## 2021-03-25 VITALS — BP 134/80 | HR 70 | Temp 97.9°F | Ht 69.5 in | Wt 216.0 lb

## 2021-03-25 DIAGNOSIS — Z125 Encounter for screening for malignant neoplasm of prostate: Secondary | ICD-10-CM | POA: Diagnosis not present

## 2021-03-25 DIAGNOSIS — I2510 Atherosclerotic heart disease of native coronary artery without angina pectoris: Secondary | ICD-10-CM | POA: Diagnosis not present

## 2021-03-25 DIAGNOSIS — Z23 Encounter for immunization: Secondary | ICD-10-CM | POA: Diagnosis not present

## 2021-03-25 DIAGNOSIS — Z0000 Encounter for general adult medical examination without abnormal findings: Secondary | ICD-10-CM

## 2021-03-25 DIAGNOSIS — I10 Essential (primary) hypertension: Secondary | ICD-10-CM

## 2021-03-25 NOTE — Assessment & Plan Note (Signed)
Gets stable DOE---but seems to be deconditioning with negative work up  Is on statin, ASA, metoprolol, lisinopril

## 2021-03-25 NOTE — Addendum Note (Signed)
Addended by: Pilar Grammes on: 03/25/2021 04:47 PM   Modules accepted: Orders

## 2021-03-25 NOTE — Progress Notes (Signed)
Subjective:    Patient ID: Jason Gross, male    DOB: Apr 28, 1956, 65 y.o.   MRN: [id number]  HPI Here for physical This visit occurred during the SARS-CoV-2 public health emergency.  Safety protocols were in place, including screening questions prior to the visit, additional usage of staff PPE, and extensive cleaning of exam room while observing appropriate contact time as indicated for disinfecting solutions.   Doing fine No new concerns Still sees cardiology once a year---had testing which was okay Mom died from dementia this year  Current Outpatient Medications on File Prior to Visit  Medication Sig Dispense Refill   amLODipine (NORVASC) 10 MG tablet Take 10 mg by mouth daily.     aspirin EC 81 MG EC tablet Take 1 tablet (81 mg total) by mouth daily. 30 tablet 0   atorvastatin (LIPITOR) 80 MG tablet Take 80 mg by mouth daily.     cetirizine (ZYRTEC) 10 MG tablet Take 10 mg by mouth daily.       lisinopril (ZESTRIL) 20 MG tablet Take 20 mg by mouth daily.     metoprolol tartrate (LOPRESSOR) 25 MG tablet Take 0.5 tablets (12.5 mg total) by mouth 2 (two) times daily. 1 tablet 0   No current facility-administered medications on file prior to visit.    Not on File  Past Medical History:  Diagnosis Date   Allergy    Benign mass of parotid gland    GERD (gastroesophageal reflux disease)    Hyperlipidemia    not on meds   Hypertension    Vertigo     Past Surgical History:  Procedure Laterality Date   LEFT HEART CATH AND CORONARY ANGIOGRAPHY N/A 06/30/2017   Procedure: LEFT HEART CATH AND CORONARY ANGIOGRAPHY;  Surgeon: Nelva Bush, MD;  Location: [facility];  Service: Cardiovascular;  Laterality: N/A;   PAROTIDECTOMY Left 01/07/2017   Procedure: PAROTIDECTOMY (SUPERFICIAL);  Surgeon: Clyde Canterbury, MD;  Location: [facility];  Service: ENT;  Laterality: Left;    Family History  Problem Relation Age of Onset   Healthy Mother    Dementia Mother    Coronary  artery disease Father    Diabetes Sister    Diabetes Maternal Uncle    Esophageal cancer Paternal Uncle    Coronary artery disease Maternal Grandfather    Diabetes Maternal Grandfather    Hypertension Neg Hx    Cancer Neg Hx    Colon cancer Neg Hx    Colon polyps Neg Hx    Rectal cancer Neg Hx    Stomach cancer Neg Hx     Social History   Socioeconomic History   Marital status: Married    Spouse name: Not on file   Number of children: 1   Years of education: Not on file   Highest education level: Not on file  Occupational History   Occupation: Machinist @ [employer]  Tobacco Use   Smoking status: Former    Types: Cigarettes    Quit date: 07/28/1968    Years since quitting: 52.6   Smokeless tobacco: Never  Vaping Use   Vaping Use: Never used  Substance and Sexual Activity   Alcohol use: No    Comment: occasional   Drug use: No   Sexual activity: Yes  Other Topics Concern   Not on file  Social History Narrative   Not on file   Social Determinants of Health   Financial Resource Strain: Not on file  Food Insecurity: Not on file  Transportation Needs: Not on file  Physical Activity: Not on file  Stress: Not on file  Social Connections: Not on file  Intimate Partner Violence: Not on file   Review of Systems  Constitutional:        Weight down 4# No exercise---discussed Wears seat belt  HENT:  Positive for hearing loss and tinnitus. Negative for dental problem and trouble swallowing.        Keeps up with dentist  Eyes:  Negative for visual disturbance.       No diplopia or unilateral vision loss  Respiratory:  Negative for cough, chest tightness and shortness of breath.   Cardiovascular:  Negative for chest pain, palpitations and leg swelling.  Gastrointestinal:  Negative for blood in stool and constipation.       Rare heartburn--no meds  Endocrine: Negative for polydipsia and polyuria.  Genitourinary:  Negative for difficulty urinating and urgency.       No  sexual problems  Musculoskeletal:  Negative for arthralgias and joint swelling.       Occ back pain at night---ibuprofen helps (discussed trying to transition to tylenol)  Skin:  Negative for rash.       No suspicious lesions  Allergic/Immunologic: Positive for environmental allergies. Negative for immunocompromised state.       Takes cetirizine daily  Neurological:  Negative for dizziness, syncope, light-headedness and headaches.       Still gets vertigo--not new   Hematological:  Negative for adenopathy. Does not bruise/bleed easily.  Psychiatric/Behavioral:  Negative for dysphoric mood and sleep disturbance. The patient is not nervous/anxious.       Objective:   Physical Exam Constitutional:      Appearance: Normal appearance.  HENT:     Right Ear: Tympanic membrane and ear canal normal.     Left Ear: Tympanic membrane normal.     Mouth/Throat:     Pharynx: No oropharyngeal exudate or posterior oropharyngeal erythema.  Eyes:     Conjunctiva/sclera: Conjunctivae normal.     Pupils: Pupils are equal, round, and reactive to light.  Cardiovascular:     Rate and Rhythm: Normal rate and regular rhythm.     Pulses: Normal pulses.     Heart sounds: No murmur heard.   No gallop.  Pulmonary:     Effort: Pulmonary effort is normal.     Breath sounds: Normal breath sounds. No wheezing or rales.  Abdominal:     Palpations: Abdomen is soft.     Tenderness: There is no abdominal tenderness.  Musculoskeletal:     Cervical back: Neck supple.     Right lower leg: No edema.     Left lower leg: No edema.  Lymphadenopathy:     Cervical: No cervical adenopathy.  Skin:    General: Skin is warm.     Findings: No rash.  Neurological:     General: No focal deficit present.     Mental Status: He is alert and oriented to person, place, and time.  Psychiatric:        Mood and Affect: Mood normal.        Behavior: Behavior normal.           Assessment & Plan:

## 2021-03-25 NOTE — Assessment & Plan Note (Signed)
Healthy but really needs to work on fitness Flu vaccine and shingrix today Bivalent COVID when out --has had 2 [place] for colon---will check with Dr Loletha Carrow PSA after discussion

## 2021-03-25 NOTE — Assessment & Plan Note (Signed)
BP Readings from Last 3 Encounters:  03/25/21 134/80  03/21/20 124/82  03/21/19 140/72   Good control on heart meds plus amlodipine

## 2021-03-26 LAB — COMPREHENSIVE METABOLIC PANEL
ALT: 28 U/L (ref 0–53)
AST: 19 U/L (ref 0–37)
Albumin: 4.5 g/dL (ref 3.5–5.2)
Alkaline Phosphatase: 67 U/L (ref 39–117)
BUN: 13 mg/dL (ref 6–23)
CO2: 29 mEq/L (ref 19–32)
Calcium: 9.5 mg/dL (ref 8.4–10.5)
Chloride: 102 mEq/L (ref 96–112)
Creatinine, Ser: 0.9 mg/dL (ref 0.40–1.50)
GFR: 90.54 mL/min (ref >60.00)
Glucose, Bld: 84 mg/dL (ref 70–99)
Potassium: 4.5 mEq/L (ref 3.5–5.1)
Sodium: 140 mEq/L (ref 135–145)
Total Bilirubin: 0.6 mg/dL (ref 0.2–1.2)
Total Protein: 7.3 g/dL (ref 6.0–8.3)

## 2021-03-26 LAB — CBC
HCT: 43.3 % (ref 39.0–52.0)
Hemoglobin: 14.9 g/dL (ref 13.0–17.0)
MCHC: 34.4 g/dL (ref 30.0–36.0)
MCV: 84.4 fl (ref 78.0–100.0)
Platelets: 206 10*3/uL (ref 150.0–400.0)
RBC: 5.14 Mil/uL (ref 4.22–5.81)
RDW: 14 % (ref 11.5–15.5)
WBC: 7.1 10*3/uL (ref 4.0–10.5)

## 2021-03-26 LAB — LIPID PANEL
Cholesterol: 119 mg/dL (ref 0–200)
HDL: 35.9 mg/dL — ABNORMAL LOW (ref >39.00)
LDL Cholesterol: 64 mg/dL (ref 0–99)
NonHDL: 83.34
Total CHOL/HDL Ratio: 3
Triglycerides: 97 mg/dL (ref 0.0–149.0)
VLDL: 19.4 mg/dL (ref 0.0–40.0)

## 2021-03-26 LAB — PSA: PSA: 0.74 ng/mL (ref 0.10–4.00)

## 2021-06-26 ENCOUNTER — Other Ambulatory Visit: Payer: Self-pay

## 2021-06-26 ENCOUNTER — Ambulatory Visit (INDEPENDENT_AMBULATORY_CARE_PROVIDER_SITE_OTHER): Payer: BC Managed Care – PPO

## 2021-06-26 DIAGNOSIS — Z23 Encounter for immunization: Secondary | ICD-10-CM

## 2021-06-26 NOTE — Progress Notes (Signed)
Per orders of Dr. Silvio Pate, an injection of Shingrix was given by Ophelia Shoulder, CMA. Patient tolerated injection well.

## 2021-07-15 DIAGNOSIS — E782 Mixed hyperlipidemia: Secondary | ICD-10-CM | POA: Diagnosis not present

## 2021-07-15 DIAGNOSIS — R001 Bradycardia, unspecified: Secondary | ICD-10-CM | POA: Diagnosis not present

## 2021-07-15 DIAGNOSIS — I10 Essential (primary) hypertension: Secondary | ICD-10-CM | POA: Diagnosis not present

## 2021-07-15 DIAGNOSIS — I2510 Atherosclerotic heart disease of native coronary artery without angina pectoris: Secondary | ICD-10-CM | POA: Diagnosis not present

## 2021-08-08 ENCOUNTER — Encounter: Payer: Self-pay | Admitting: Gastroenterology

## 2021-08-08 NOTE — Progress Notes (Signed)
I have called Jason Gross many times at different times of the day. I have also had other schedulers reach out with no response. I will send him another letter today.

## 2022-04-03 ENCOUNTER — Ambulatory Visit (INDEPENDENT_AMBULATORY_CARE_PROVIDER_SITE_OTHER): Payer: No Typology Code available for payment source | Admitting: Internal Medicine

## 2022-04-03 ENCOUNTER — Encounter: Payer: Self-pay | Admitting: Internal Medicine

## 2022-04-03 VITALS — BP 106/70 | HR 68 | Temp 97.9°F | Ht 69.5 in | Wt 215.0 lb

## 2022-04-03 DIAGNOSIS — Z125 Encounter for screening for malignant neoplasm of prostate: Secondary | ICD-10-CM | POA: Diagnosis not present

## 2022-04-03 DIAGNOSIS — I10 Essential (primary) hypertension: Secondary | ICD-10-CM

## 2022-04-03 DIAGNOSIS — Z0000 Encounter for general adult medical examination without abnormal findings: Secondary | ICD-10-CM | POA: Diagnosis not present

## 2022-04-03 DIAGNOSIS — Z23 Encounter for immunization: Secondary | ICD-10-CM | POA: Diagnosis not present

## 2022-04-03 DIAGNOSIS — I2510 Atherosclerotic heart disease of native coronary artery without angina pectoris: Secondary | ICD-10-CM

## 2022-04-03 DIAGNOSIS — J301 Allergic rhinitis due to pollen: Secondary | ICD-10-CM

## 2022-04-03 NOTE — Addendum Note (Signed)
Addended by: Pilar Grammes on: 04/03/2022 04:43 PM   Modules accepted: Orders

## 2022-04-03 NOTE — Assessment & Plan Note (Signed)
Now taking cetirizine daily--may cut back in winter

## 2022-04-03 NOTE — Assessment & Plan Note (Signed)
I have personally reviewed the Medicare Annual Wellness questionnaire and have noted 1. The patient's medical and social history 2. Their use of alcohol, tobacco or illicit drugs 3. Their current medications and supplements 4. The patient's functional ability including ADL's, fall risks, home safety risks and hearing or visual             impairment. 5. Diet and physical activities 6. Evidence for depression or mood disorders  The patients weight, height, BMI and visual acuity have been recorded in the chart I have made referrals, counseling and provided education to the patient based review of the above and I have provided the pt with a written personalized care plan for preventive services.  I have provided you with a copy of your personalized plan for preventive services. Please take the time to review along with your updated medication list.  Still overdue for colonoscopy--reached out to Dr Loletha Carrow to try again to get him scheduled Discussed PSA--will check Needs to do more exercise High dose flu and pneumovax 23 vaccines today Discussed COVID vaccine--he is hesitant but I recommended

## 2022-04-03 NOTE — Assessment & Plan Note (Signed)
Past MI with out of hospital arrest Doing well now On atorvastatin 80, lisinopril 20, metoprolol 12.5 bid and ASA 81

## 2022-04-03 NOTE — Progress Notes (Signed)
Hearing Screening  Method: Audiometry   '500Hz'$  '1000Hz'$  '2000Hz'$  '4000Hz'$   Right ear '20 20 20 20  '$ Left ear '20 20 20 20   '$ Vision Screening   Right eye Left eye Both eyes  Without correction '20/40 20/30 20/20 '$  With correction

## 2022-04-03 NOTE — Assessment & Plan Note (Signed)
BP Readings from Last 3 Encounters:  04/03/22 106/70  03/25/21 134/80  03/21/20 124/82   Controlled on his heart meds

## 2022-04-03 NOTE — Progress Notes (Signed)
Subjective:    Patient ID: Jason Gross, male    DOB: Dec 12, 1955, 66 y.o.   MRN: [id number]  HPI Here for Welcome to Medicare visit and follow up of chronic health conditions Reviewed advanced directives Reviewed other doctors---Dr Kowalski--cardiology, Dr Rafael--dentist No hospitalizations or surgery in the past year Vision is fine--needs eye exam Not really exercising--but lots of yard work. Discussed No alcohol or tobacco No falls No depression or anhedonia Independent with instrumental ADLs No sig memory problems  Chronic tinnitus--tried OTC med without effect Hearing is not great---had audiology evaluation but not excited about  Still some vertigo  No chest pain No SOB No dizziness or syncope No palpitations No edema  Current Outpatient Medications on File Prior to Visit  Medication Sig Dispense Refill  . amLODipine (NORVASC) 10 MG tablet Take 10 mg by mouth daily.    Marland Kitchen aspirin EC 81 MG EC tablet Take 1 tablet (81 mg total) by mouth daily. 30 tablet 0  . atorvastatin (LIPITOR) 80 MG tablet Take 80 mg by mouth daily.    . cetirizine (ZYRTEC) 10 MG tablet Take 10 mg by mouth daily.      Marland Kitchen lisinopril (ZESTRIL) 20 MG tablet Take 20 mg by mouth daily.    . metoprolol tartrate (LOPRESSOR) 25 MG tablet Take 0.5 tablets (12.5 mg total) by mouth 2 (two) times daily. 1 tablet 0  . Vitamins-Lipotropics (LIPO-FLAVONOID PLUS PO) Take by mouth.     No current facility-administered medications on file prior to visit.    No Known Allergies  Past Medical History:  Diagnosis Date  . Allergy   . Benign mass of parotid gland   . GERD (gastroesophageal reflux disease)   . Hyperlipidemia    not on meds  . Hypertension   . Vertigo     Past Surgical History:  Procedure Laterality Date  . LEFT HEART CATH AND CORONARY ANGIOGRAPHY N/A 06/30/2017   Procedure: LEFT HEART CATH AND CORONARY ANGIOGRAPHY;  Surgeon: Nelva Bush, MD;  Location: [facility];  Service:  Cardiovascular;  Laterality: N/A;  . PAROTIDECTOMY Left 01/07/2017   Procedure: PAROTIDECTOMY (SUPERFICIAL);  Surgeon: Clyde Canterbury, MD;  Location: [facility];  Service: ENT;  Laterality: Left;    Family History  Problem Relation Age of Onset  . Healthy Mother   . Dementia Mother   . Coronary artery disease Father   . Diabetes Sister   . Diabetes Maternal Uncle   . Esophageal cancer Paternal Uncle   . Coronary artery disease Maternal Grandfather   . Diabetes Maternal Grandfather   . Hypertension Neg Hx   . Cancer Neg Hx   . Colon cancer Neg Hx   . Colon polyps Neg Hx   . Rectal cancer Neg Hx   . Stomach cancer Neg Hx     Social History   Socioeconomic History  . Marital status: Married    Spouse name: Not on file  . Number of children: 1  . Years of education: Not on file  . Highest education level: Not on file  Occupational History  . 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 Smoking status: Former    Types: Cigarettes    Quit date: 07/28/1968    Years since quitting: 53.7    Passive exposure: Past  . Smokeless tobacco: Never  Vaping Use  . Vaping Use: Never used  Substance and Sexual Activity  . Alcohol use: No    Comment:  occasional  . Drug use: No  . Sexual activity: Yes  Other Topics Concern  . Not on file  Social History Narrative   No living will--but working with attorney   Wife should be medical POA----son is alternate   Would accept resuscitation   No prolonged feeding tube if cognitively unaware   Social Determinants of Health   Financial Resource Strain: Not on file  Food Insecurity: Not on file  Transportation Needs: Not on file  Physical Activity: Not on file  Stress: Not on file  Social Connections: Not on file  Intimate Partner Violence: Not on file   Review of Systems Appetite is fine Weight fairly stable Sleeps okay Wears seat belt Teeth okay---keeps up with dentist No suspicious skin lesions Rare  heartburn--no meds. No dysphagia Bowels are fine--no blood No sig back or joint pains Voids well--good flow. Only occasional nocturia    Objective:   Physical Exam Constitutional:      Appearance: Normal appearance.  HENT:     Mouth/Throat:     Comments: No lesions Eyes:     Conjunctiva/sclera: Conjunctivae normal.     Pupils: Pupils are equal, round, and reactive to light.  Cardiovascular:     Rate and Rhythm: Normal rate and regular rhythm.     Pulses: Normal pulses.     Heart sounds: No murmur heard.    No gallop.  Pulmonary:     Effort: Pulmonary effort is normal.     Breath sounds: Normal breath sounds. No wheezing or rales.  Abdominal:     Palpations: Abdomen is soft.     Tenderness: There is no abdominal tenderness.  Musculoskeletal:     Cervical back: Neck supple.     Right lower leg: No edema.     Left lower leg: No edema.  Lymphadenopathy:     Cervical: No cervical adenopathy.  Skin:    Findings: No lesion or rash.  Neurological:     General: No focal deficit present.     Mental Status: He is alert and oriented to person, place, and time.     Comments: Mini-cog normal  Psychiatric:        Mood and Affect: Mood normal.        Behavior: Behavior normal.           Assessment & Plan:

## 2022-04-04 ENCOUNTER — Telehealth: Payer: Self-pay

## 2022-04-04 LAB — LIPID PANEL
Cholesterol: 94 mg/dL (ref 0–200)
HDL: 31.5 mg/dL — ABNORMAL LOW (ref >39.00)
LDL Cholesterol: 44 mg/dL (ref 0–99)
NonHDL: 62.99
Total CHOL/HDL Ratio: 3
Triglycerides: 97 mg/dL (ref 0.0–149.0)
VLDL: 19.4 mg/dL (ref 0.0–40.0)

## 2022-04-04 LAB — COMPREHENSIVE METABOLIC PANEL
ALT: 44 U/L (ref 0–53)
AST: 26 U/L (ref 0–37)
Albumin: 4.4 g/dL (ref 3.5–5.2)
Alkaline Phosphatase: 63 U/L (ref 39–117)
BUN: 15 mg/dL (ref 6–23)
CO2: 29 mEq/L (ref 19–32)
Calcium: 10.3 mg/dL (ref 8.4–10.5)
Chloride: 102 mEq/L (ref 96–112)
Creatinine, Ser: 0.95 mg/dL (ref 0.40–1.50)
GFR: 84.24 mL/min (ref >60.00)
Glucose, Bld: 87 mg/dL (ref 70–99)
Potassium: 4.9 mEq/L (ref 3.5–5.1)
Sodium: 140 mEq/L (ref 135–145)
Total Bilirubin: 0.7 mg/dL (ref 0.2–1.2)
Total Protein: 7.1 g/dL (ref 6.0–8.3)

## 2022-04-04 LAB — CBC
HCT: 43.9 % (ref 39.0–52.0)
Hemoglobin: 15.1 g/dL (ref 13.0–17.0)
MCHC: 34.4 g/dL (ref 30.0–36.0)
MCV: 84.5 fl (ref 78.0–100.0)
Platelets: 207 10*3/uL (ref 150.0–400.0)
RBC: 5.2 Mil/uL (ref 4.22–5.81)
RDW: 14 % (ref 11.5–15.5)
WBC: 5.1 10*3/uL (ref 4.0–10.5)

## 2022-04-04 LAB — PSA, MEDICARE: PSA: 0.81 ng/ml (ref 0.10–4.00)

## 2022-04-04 NOTE — Telephone Encounter (Signed)
Left a message to call and schedule a nurse visit and colonoscopy   Danis, Kirke Corin, MD  Elias Else, CMA Vivien Rota,   Please help arrange an [facility] previsit for this patient so he can be scheduled for a surveillance colonoscopy with me.  History of colon polyps in 2018.   Thanks   - HD

## 2022-04-14 DIAGNOSIS — E782 Mixed hyperlipidemia: Secondary | ICD-10-CM | POA: Diagnosis not present

## 2022-04-14 DIAGNOSIS — I2510 Atherosclerotic heart disease of native coronary artery without angina pectoris: Secondary | ICD-10-CM | POA: Diagnosis not present

## 2022-04-14 DIAGNOSIS — I10 Essential (primary) hypertension: Secondary | ICD-10-CM | POA: Diagnosis not present

## 2022-04-17 NOTE — Telephone Encounter (Signed)
Left a message to return call.  

## 2023-03-12 ENCOUNTER — Encounter (INDEPENDENT_AMBULATORY_CARE_PROVIDER_SITE_OTHER): Payer: Self-pay

## 2023-04-07 ENCOUNTER — Ambulatory Visit: Payer: No Typology Code available for payment source | Admitting: Internal Medicine

## 2023-04-07 ENCOUNTER — Encounter: Payer: Self-pay | Admitting: Internal Medicine

## 2023-04-07 VITALS — BP 130/78 | HR 52 | Temp 97.6°F | Ht 70.0 in | Wt 217.0 lb

## 2023-04-07 DIAGNOSIS — Z125 Encounter for screening for malignant neoplasm of prostate: Secondary | ICD-10-CM

## 2023-04-07 DIAGNOSIS — Z23 Encounter for immunization: Secondary | ICD-10-CM | POA: Diagnosis not present

## 2023-04-07 DIAGNOSIS — J301 Allergic rhinitis due to pollen: Secondary | ICD-10-CM

## 2023-04-07 DIAGNOSIS — I2510 Atherosclerotic heart disease of native coronary artery without angina pectoris: Secondary | ICD-10-CM

## 2023-04-07 DIAGNOSIS — Z0000 Encounter for general adult medical examination without abnormal findings: Secondary | ICD-10-CM

## 2023-04-07 DIAGNOSIS — I10 Essential (primary) hypertension: Secondary | ICD-10-CM

## 2023-04-07 LAB — COMPREHENSIVE METABOLIC PANEL
ALT: 35 U/L (ref 0–53)
AST: 20 U/L (ref 0–37)
Albumin: 4.2 g/dL (ref 3.5–5.2)
Alkaline Phosphatase: 73 U/L (ref 39–117)
BUN: 15 mg/dL (ref 6–23)
CO2: 31 meq/L (ref 19–32)
Calcium: 9.7 mg/dL (ref 8.4–10.5)
Chloride: 102 meq/L (ref 96–112)
Creatinine, Ser: 0.9 mg/dL (ref 0.40–1.50)
GFR: 89.25 mL/min (ref >60.00)
Glucose, Bld: 113 mg/dL — ABNORMAL HIGH (ref 70–99)
Potassium: 4.5 meq/L (ref 3.5–5.1)
Sodium: 140 meq/L (ref 135–145)
Total Bilirubin: 0.8 mg/dL (ref 0.2–1.2)
Total Protein: 7.1 g/dL (ref 6.0–8.3)

## 2023-04-07 LAB — LIPID PANEL
Cholesterol: 108 mg/dL (ref 0–200)
HDL: 37.6 mg/dL — ABNORMAL LOW (ref >39.00)
LDL Cholesterol: 52 mg/dL (ref 0–99)
NonHDL: 70.29
Total CHOL/HDL Ratio: 3
Triglycerides: 90 mg/dL (ref 0.0–149.0)
VLDL: 18 mg/dL (ref 0.0–40.0)

## 2023-04-07 LAB — CBC
HCT: 45.8 % (ref 39.0–52.0)
Hemoglobin: 15.4 g/dL (ref 13.0–17.0)
MCHC: 33.5 g/dL (ref 30.0–36.0)
MCV: 85.5 fl (ref 78.0–100.0)
Platelets: 248 10*3/uL (ref 150.0–400.0)
RBC: 5.36 Mil/uL (ref 4.22–5.81)
RDW: 13.8 % (ref 11.5–15.5)
WBC: 8.5 10*3/uL (ref 4.0–10.5)

## 2023-04-07 LAB — PSA, MEDICARE: PSA: 1.08 ng/mL (ref 0.10–4.00)

## 2023-04-07 NOTE — Assessment & Plan Note (Signed)
Does well with cetirizine--fairly regular with yard work

## 2023-04-07 NOTE — Assessment & Plan Note (Signed)
No angina but does have stable DOE On atorvastatin 80, asa 81, lisinopril 20, metoprolol 12.5 bid

## 2023-04-07 NOTE — Progress Notes (Signed)
Hearing Screening - Comments:: Passed whisper test Vision Screening - Comments:: September 2023. Appt tomorrow

## 2023-04-07 NOTE — Assessment & Plan Note (Signed)
I have personally reviewed the Medicare Annual Wellness questionnaire and have noted 1. The patient's medical and social history 2. Their use of alcohol, tobacco or illicit drugs 3. Their current medications and supplements 4. The patient's functional ability including ADL's, fall risks, home safety risks and hearing or visual             impairment. 5. Diet and physical activities 6. Evidence for depression or mood disorders  The patients weight, height, BMI and visual acuity have been recorded in the chart I have made referrals, counseling and provided education to the patient based review of the above and I have provided the pt with a written personalized care plan for preventive services.  I have provided you with a copy of your personalized plan for preventive services. Please take the time to review along with your updated medication list.  Needs to be better about exercise Flu vaccine today Not excited about COVID vaccine Overdue for colonoscopy --will try again Will check PSA

## 2023-04-07 NOTE — Assessment & Plan Note (Signed)
BP Readings from Last 3 Encounters:  04/07/23 130/78  04/03/22 106/70  03/25/21 134/80   Fine on the heart meds

## 2023-04-07 NOTE — Progress Notes (Signed)
Subjective:    Patient ID: Jason Gross, male    DOB: May 26, 1956, 67 y.o.   MRN: [id number]  HPI Here for Medicare wellness visit and follow up of chronic health conditions Reviewed advanced directives Reviewed other doctors--- [facility], [facility] No hospitalizations or surgery in the past year No set exercise--stays active (felling trees, etc) Vision is okay--going tomorrow Hearing is not great--constant tinnitus. May consider aides No alcohol or tobacco No falls No depression or anhedonia Independent with instrumental ADLs No memory issues  Doing well No heart symptoms No chest pain  Does get DOE---stable (knows he is out of shape) No dizziness or syncope No edema Released from cardiology  Some vertigo---especially if repetitive bending (like in yard) Hasn't needed meclizine  Current Outpatient Medications on File Prior to Visit  Medication Sig Dispense Refill   amLODipine (NORVASC) 10 MG tablet Take 10 mg by mouth daily.     aspirin EC 81 MG EC tablet Take 1 tablet (81 mg total) by mouth daily. 30 tablet 0   atorvastatin (LIPITOR) 80 MG tablet Take 80 mg by mouth daily.     cetirizine (ZYRTEC) 10 MG tablet Take 10 mg by mouth daily.       lisinopril (ZESTRIL) 20 MG tablet Take 20 mg by mouth daily.     metoprolol tartrate (LOPRESSOR) 25 MG tablet Take 0.5 tablets (12.5 mg total) by mouth 2 (two) times daily. 1 tablet 0   No current facility-administered medications on file prior to visit.    No Known Allergies  Past Medical History:  Diagnosis Date   Allergy    Benign mass of parotid gland    GERD (gastroesophageal reflux disease)    Hyperlipidemia    not on meds   Hypertension    Vertigo     Past Surgical History:  Procedure Laterality Date   LEFT HEART CATH AND CORONARY ANGIOGRAPHY N/A 06/30/2017   Procedure: LEFT HEART CATH AND CORONARY ANGIOGRAPHY;  Surgeon: Yvonne Kendall, MD;  Location: [facility];  Service: Cardiovascular;   Laterality: N/A;   PAROTIDECTOMY Left 01/07/2017   Procedure: PAROTIDECTOMY (SUPERFICIAL);  Surgeon: Geanie Logan, MD;  Location: [facility];  Service: ENT;  Laterality: Left;    Family History  Problem Relation Age of Onset   Healthy Mother    Dementia Mother    Coronary artery disease Father    Diabetes Sister    Coronary artery disease Maternal Grandfather    Diabetes Maternal Grandfather    Diabetes Maternal Uncle    Esophageal cancer Paternal Uncle    Hypertension Neg Hx    Cancer Neg Hx    Colon cancer Neg Hx    Colon polyps Neg Hx    Rectal cancer Neg Hx    Stomach cancer Neg Hx     Social History   Socioeconomic History   Marital status: Married    Spouse name: Not on file   Number of children: 1   Years of education: Not on file   Highest education level: Not on file  Occupational History   Occupation: Chartered certified accountant @ [employer]    Comment: Retired  Tobacco Use   Smoking status: Former    Current packs/day: 0.00    Types: Cigarettes    Quit date: 07/28/1968    Years since quitting: 54.7    Passive exposure: Past   Smokeless tobacco: Never  Vaping Use   Vaping status: Never Used  Substance and Sexual Activity   Alcohol use:  No    Comment: occasional   Drug use: No   Sexual activity: Yes  Other Topics Concern   Not on file  Social History Narrative   No living will--but working with attorney   Wife should be medical POA----son is alternate   Would accept resuscitation   No prolonged feeding tube if cognitively unaware   Social Determinants of Health   Financial Resource Strain: Not on file  Food Insecurity: Not on file  Transportation Needs: Not on file  Physical Activity: Not on file  Stress: Not on file  Social Connections: Not on file  Intimate Partner Violence: Not on file   Review of Systems Appetite is fine Weight is stable Sleeps well Wears seat belt Teeth are okay---keeps up with dentist No skin problems No sig back or joint pains Rare  heartburn---no dysphagia Bowels move fine--no blood    Objective:   Physical Exam Constitutional:      Appearance: Normal appearance.  HENT:     Mouth/Throat:     Pharynx: No oropharyngeal exudate or posterior oropharyngeal erythema.  Eyes:     Conjunctiva/sclera: Conjunctivae normal.     Pupils: Pupils are equal, round, and reactive to light.  Cardiovascular:     Rate and Rhythm: Normal rate and regular rhythm.     Pulses: Normal pulses.     Heart sounds: No murmur heard.    No gallop.  Pulmonary:     Effort: Pulmonary effort is normal.     Breath sounds: Normal breath sounds. No wheezing or rales.  Abdominal:     Palpations: Abdomen is soft.     Tenderness: There is no abdominal tenderness.  Musculoskeletal:     Cervical back: Neck supple.     Right lower leg: No edema.     Left lower leg: No edema.  Lymphadenopathy:     Cervical: No cervical adenopathy.  Skin:    Findings: No lesion or rash.  Neurological:     General: No focal deficit present.     Mental Status: He is alert and oriented to person, place, and time.     Comments: Word naming 2--then froze Recall--- 2/3  Psychiatric:        Mood and Affect: Mood normal.        Behavior: Behavior normal.            Assessment & Plan:

## 2023-04-07 NOTE — Patient Instructions (Addendum)
Please call to set up your colonoscopy--- [phone]

## 2023-04-08 ENCOUNTER — Telehealth: Payer: Self-pay

## 2023-04-08 DIAGNOSIS — H2513 Age-related nuclear cataract, bilateral: Secondary | ICD-10-CM | POA: Diagnosis not present

## 2023-04-08 NOTE — Telephone Encounter (Signed)
-----   Message from Charlie Pitter III sent at 04/07/2023  5:20 PM EDT ----- Sharol Harness,  This man is overdue for a surveillance colonoscopy with me (history of colon polyps).  Primary care sent me his recent office note and requested we contact his wife by phone as the best method for setting up any necessary appointments.  Would appreciate you facilitating arrangements with the [facility] previsit nurses.  Thank you  HD

## 2023-04-08 NOTE — Telephone Encounter (Signed)
Spoke with patient's wife to schedule pt's surveillance colonoscopy. Patient is scheduled for a telephone PV on Thursday, 04/16/23 at 2:00 pm. Colonoscopy scheduled for Thursday, 04/30/23 at 1:30 pm, arriving at 12:30 pm with a care partner. Patient's wife verbalized understanding and had no concerns at the end of the call.

## 2023-04-16 ENCOUNTER — Ambulatory Visit (AMBULATORY_SURGERY_CENTER): Payer: No Typology Code available for payment source

## 2023-04-16 ENCOUNTER — Encounter: Payer: Self-pay | Admitting: Gastroenterology

## 2023-04-16 ENCOUNTER — Other Ambulatory Visit: Payer: Self-pay

## 2023-04-16 VITALS — Ht 70.0 in | Wt 218.0 lb

## 2023-04-16 DIAGNOSIS — Z86010 Personal history of colonic polyps: Secondary | ICD-10-CM

## 2023-04-16 MED ORDER — NA SULFATE-K SULFATE-MG SULF 17.5-3.13-1.6 GM/177ML PO SOLN
1.0000 | Freq: Once | ORAL | 0 refills | Status: AC
Start: 2023-04-16 — End: 2023-04-16

## 2023-04-16 NOTE — Progress Notes (Signed)
Denies allergies to eggs or soy products. Denies complication of anesthesia or sedation. Denies use of weight loss medication. Denies use of O2.   Emmi instructions given for colonoscopy.  

## 2023-04-30 ENCOUNTER — Ambulatory Visit: Payer: No Typology Code available for payment source | Admitting: Gastroenterology

## 2023-04-30 ENCOUNTER — Encounter: Payer: Self-pay | Admitting: Gastroenterology

## 2023-04-30 VITALS — BP 116/62 | HR 54 | Temp 97.1°F | Resp 15 | Ht 70.0 in | Wt 218.0 lb

## 2023-04-30 DIAGNOSIS — Z860100 Personal history of colon polyps, unspecified: Secondary | ICD-10-CM

## 2023-04-30 DIAGNOSIS — I2510 Atherosclerotic heart disease of native coronary artery without angina pectoris: Secondary | ICD-10-CM | POA: Diagnosis not present

## 2023-04-30 DIAGNOSIS — D123 Benign neoplasm of transverse colon: Secondary | ICD-10-CM | POA: Diagnosis not present

## 2023-04-30 DIAGNOSIS — D128 Benign neoplasm of rectum: Secondary | ICD-10-CM | POA: Diagnosis not present

## 2023-04-30 DIAGNOSIS — I10 Essential (primary) hypertension: Secondary | ICD-10-CM | POA: Diagnosis not present

## 2023-04-30 DIAGNOSIS — Z09 Encounter for follow-up examination after completed treatment for conditions other than malignant neoplasm: Secondary | ICD-10-CM | POA: Diagnosis not present

## 2023-04-30 DIAGNOSIS — Z1211 Encounter for screening for malignant neoplasm of colon: Secondary | ICD-10-CM | POA: Diagnosis not present

## 2023-04-30 MED ORDER — SODIUM CHLORIDE 0.9 % IV SOLN: 500.0000 mL | Freq: Once | INTRAVENOUS | Status: DC

## 2023-04-30 NOTE — Progress Notes (Signed)
History and Physical:  This patient presents for endoscopic testing for: Encounter Diagnosis  Name Primary?   Hx of colonic polyps Yes    Surveillance colonoscopy for TA, TVA and SSP < 10mm in Nov 2018 Patient denies chronic abdominal pain, rectal bleeding, constipation or diarrhea.   Patient is otherwise without complaints or active issues today.   Past Medical History: Past Medical History:  Diagnosis Date   Allergy    Benign mass of parotid gland    Cataract    GERD (gastroesophageal reflux disease)    Hyperlipidemia    not on meds   Hypertension    Myocardial infarction [facility])    Vertigo      Past Surgical History: Past Surgical History:  Procedure Laterality Date   LEFT HEART CATH AND CORONARY ANGIOGRAPHY N/A 06/30/2017   Procedure: LEFT HEART CATH AND CORONARY ANGIOGRAPHY;  Surgeon: Yvonne Kendall, MD;  Location: [facility] INVASIVE CV LAB;  Service: Cardiovascular;  Laterality: N/A;   PAROTIDECTOMY Left 01/07/2017   Procedure: PAROTIDECTOMY (SUPERFICIAL);  Surgeon: Geanie Logan, MD;  Location: [facility] ORS;  Service: ENT;  Laterality: Left;    Allergies: No Known Allergies  Outpatient Meds: Current Outpatient Medications  Medication Sig Dispense Refill   amLODipine (NORVASC) 10 MG tablet Take 10 mg by mouth daily.     aspirin EC 81 MG EC tablet Take 1 tablet (81 mg total) by mouth daily. 30 tablet 0   atorvastatin (LIPITOR) 80 MG tablet Take 80 mg by mouth daily.     cetirizine (ZYRTEC) 10 MG tablet Take 10 mg by mouth daily.       lisinopril (ZESTRIL) 20 MG tablet Take 20 mg by mouth daily.     metoprolol tartrate (LOPRESSOR) 25 MG tablet Take 0.5 tablets (12.5 mg total) by mouth 2 (two) times daily. 1 tablet 0   ibuprofen (ADVIL) 200 MG tablet Take 200 mg by mouth every 6 (six) hours as needed.     OVER THE COUNTER MEDICATION Vitamin D 3 one capsule daily.     Current Facility-Administered Medications  Medication Dose Route Frequency Provider Last Rate Last Admin    0.9 %  sodium chloride infusion  500 mL Intravenous Once Sherrilyn Rist, MD          ___________________________________________________________________ Objective   Exam:  BP 128/67   Pulse 71   Temp (!) 97.1 F (36.2 C)   Resp 19   Ht 5\' 10"  (1.778 m)   Wt 218 lb (98.9 kg)   SpO2 98%   BMI 31.28 kg/m   CV: regular , S1/S2 Resp: clear to auscultation bilaterally, normal RR and effort noted GI: soft, no tenderness, with active bowel sounds.   Assessment: Encounter Diagnosis  Name Primary?   Hx of colonic polyps Yes     Plan: Colonoscopy   The benefits and risks of the planned procedure were described in detail with the patient or (when appropriate) their health care proxy.  Risks were outlined as including, but not limited to, bleeding, infection, perforation, adverse medication reaction leading to cardiac or pulmonary decompensation, pancreatitis (if ERCP).  The limitation of incomplete mucosal visualization was also discussed.  No guarantees or warranties were given.  The patient is appropriate for an endoscopic procedure in the ambulatory setting.   - Amada Jupiter, MD

## 2023-04-30 NOTE — Op Note (Signed)
[facility] Patient Name: Jason Gross Procedure Date: 04/30/2023 1:32 PM MRN: [id number] Endoscopist: Sherilyn Cooter L. Myrtie Neither , MD, [id number] Age: 67 Referring MD:  Date of Birth: Jul 30, 1955 Gender: Male Account #: [id number] Procedure:                Colonoscopy Indications:              High risk colon cancer surveillance: Personal                            history of colonic polyps                           Sub CM TA, TVA, SSP and HP Nov 2018 Medicines:                Monitored Anesthesia Care Procedure:                Pre-Anesthesia Assessment:                           - Prior to the procedure, a History and Physical                            was performed, and patient medications and                            allergies were reviewed. The patient's tolerance of                            previous anesthesia was also reviewed. The risks                            and benefits of the procedure and the sedation                            options and risks were discussed with the patient.                            All questions were answered, and informed consent                            was obtained. Prior Anticoagulants: The patient has                            taken no anticoagulant or antiplatelet agents. ASA                            Grade Assessment: II - A patient with mild systemic                            disease. After reviewing the risks and benefits,                            the patient was deemed in satisfactory condition to  undergo the procedure.                           After obtaining informed consent, the colonoscope                            was passed under direct vision. Throughout the                            procedure, the patient's blood pressure, pulse, and                            oxygen saturations were monitored continuously. The                            Olympus Scope [id number] was introduced through the                             anus and advanced to the the cecum, identified by                            appendiceal orifice and ileocecal valve. The                            colonoscopy was performed without difficulty. The                            patient tolerated the procedure well. The quality                            of the bowel preparation was excellent. The                            ileocecal valve, appendiceal orifice, and rectum                            were photographed. Scope In: 1:50:29 PM Scope Out: 2:03:48 PM Scope Withdrawal Time: 0 hours 10 minutes 40 seconds  Total Procedure Duration: 0 hours 13 minutes 19 seconds  Findings:                 The perianal and digital rectal examinations were                            normal.                           Two sessile and semi-sessile polyps were found in                            the rectum and transverse colon. The polyps were 3                            to 8 mm in size. These polyps were removed with a  cold snare. Resection and retrieval were complete.                           Internal hemorrhoids were found. The hemorrhoids                            were small.                           The exam was otherwise without abnormality on                            direct and retroflexion views. Complications:            No immediate complications. Estimated Blood Loss:     Estimated blood loss was minimal. Impression:               - Two 3 to 8 mm polyps in the rectum and in the                            transverse colon, removed with a cold snare.                            Resected and retrieved.                           - Internal hemorrhoids.                           - The examination was otherwise normal on direct                            and retroflexion views. Recommendation:           - Patient has a contact number available for                            emergencies. The signs  and symptoms of potential                            delayed complications were discussed with the                            patient. Return to normal activities tomorrow.                            Written discharge instructions were provided to the                            patient.                           - Resume previous diet.                           - Continue present medications.                           -  Await pathology results.                           - Repeat colonoscopy is recommended for                            surveillance. The colonoscopy date will be                            determined after pathology results from today's                            exam become available for review. Arlinda Barcelona L. Myrtie Neither, MD 04/30/2023 2:10:44 PM This report has been signed electronically.

## 2023-04-30 NOTE — Progress Notes (Signed)
Uneventful anesthetic. Report to pacu rn. Vss. Care resumed by rn. 

## 2023-04-30 NOTE — Patient Instructions (Signed)
Resume all of your previous medication today as ordered.  Read all of the handouts given to you by your recovery room nurse.  YOU HAD AN ENDOSCOPIC PROCEDURE TODAY AT THE [facility]:   Refer to the procedure report that was given to you for any specific questions about what was found during the examination.  If the procedure report does not answer your questions, please call your gastroenterologist to clarify.  If you requested that your care partner not be given the details of your procedure findings, then the procedure report has been included in a sealed envelope for you to review at your convenience later.  YOU SHOULD EXPECT: Some feelings of bloating in the abdomen. Passage of more gas than usual.  Walking can help get rid of the air that was put into your GI tract during the procedure and reduce the bloating. If you had a lower endoscopy (such as a colonoscopy or flexible sigmoidoscopy) you may notice spotting of blood in your stool or on the toilet paper. If you underwent a bowel prep for your procedure, you may not have a normal bowel movement for a few days.  Please Note:  You might notice some irritation and congestion in your nose or some drainage.  This is from the oxygen used during your procedure.  There is no need for concern and it should clear up in a day or so.  SYMPTOMS TO REPORT IMMEDIATELY:  Following lower endoscopy (colonoscopy or flexible sigmoidoscopy):  Excessive amounts of blood in the stool  Significant tenderness or worsening of abdominal pains  Swelling of the abdomen that is new, acute  Fever of 100F or higher   For urgent or emergent issues, a gastroenterologist can be reached at any hour by calling [phone]. Do not use MyChart messaging for urgent concerns.    DIET:  We do recommend a small meal at first, but then you may proceed to your regular diet.  Drink plenty of fluids but you should avoid alcoholic beverages for 24 hours.  ACTIVITY:   You should plan to take it easy for the rest of today and you should NOT DRIVE or use heavy machinery until tomorrow (because of the sedation medicines used during the test).    FOLLOW UP: Our staff will call the number listed on your records the next business day following your procedure.  We will call around 7:15- 8:00 am to check on you and address any questions or concerns that you may have regarding the information given to you following your procedure. If we do not reach you, we will leave a message.     If any biopsies were taken you will be contacted by phone or by letter within the next 1-3 weeks.  Please call us at [phone] if you have not heard about the biopsies in 3 weeks.    SIGNATURES/CONFIDENTIALITY: You and/or your care partner have signed paperwork which will be entered into your electronic medical record.  These signatures attest to the fact that that the information above on your After Visit Summary has been reviewed and is understood.  Full responsibility of the confidentiality of this discharge information lies with you and/or your care-partner.

## 2023-04-30 NOTE — Progress Notes (Signed)
Called to room to assist during endoscopic procedure.  Patient ID and intended procedure confirmed with present staff. Received instructions for my participation in the procedure from the performing physician.  

## 2023-05-01 ENCOUNTER — Telehealth: Payer: Self-pay

## 2023-05-01 NOTE — Telephone Encounter (Signed)
  Follow up Call-     04/30/2023    1:01 PM  Call back number  Post procedure Call Back phone  # [phone]  Permission to leave phone message Yes     Patient questions:  Do you have a fever, pain , or abdominal swelling? No. Pain Score  0 *  Have you tolerated food without any problems? Yes.    Have you been able to return to your normal activities? Yes.    Do you have any questions about your discharge instructions: Diet   No. Medications  No. Follow up visit  No.  Do you have questions or concerns about your Care? No.  Actions: * If pain score is 4 or above: No action needed, pain <4.

## 2023-05-05 LAB — SURGICAL PATHOLOGY

## 2023-05-09 ENCOUNTER — Encounter: Payer: Self-pay | Admitting: Gastroenterology

## 2023-05-25 ENCOUNTER — Other Ambulatory Visit: Payer: Self-pay | Admitting: Internal Medicine

## 2023-12-15 ENCOUNTER — Ambulatory Visit: Payer: Self-pay

## 2023-12-15 NOTE — Progress Notes (Signed)
 Jason Gross T. [name], MD, [name] [facility] [address] [place], [address]  Phone: [phone]  FAX: [phone]  Jason Gross - 68 y.o. male  MRN [id number]  Date of Birth: 1955-11-01  Date: 12/16/2023  PCP: Helaine Llanos, MD  Referral: Helaine Llanos, MD  Chief Complaint  Patient presents with   Shoulder Pain    Right-Hurt shoulder on Monday messing with Cat.  Heard pop   Subjective:   Jason Gross is a 68 y.o. very pleasant male patient with Body mass index is 30.6 kg/m. who presents with the following:  The patient presents with right-sided shoulder pain and injury.  This is the first time I have met the patient, he is a patient of Dr. Alger Infield.  Roughly 2 days ago he pushed his cat and rearranged her with the right shoulder.  He did hear an audible sound.  No significant history of the past of major shoulder pathology, fracture, dislocation, or operative intervention. Predominantly right now he has pain underneath the right shoulder blade No prior neck injuries  He has tried some Motrin  well, which does help a little bit, but he is only taken the small amount  Pushed cat and something  No major injuries  2 days ago Under R shoulder blade No neck injuries  He is also tried some heat, which does seem to help  Review of Systems is noted in the HPI, as appropriate  Objective:   BP (!) 148/60   Pulse (!) 46   Temp (!) 97.5 F (36.4 C) (Temporal)   Ht 5\' 10"  (1.778 m)   Wt 213 lb 4 oz (96.7 kg)   SpO2 98%   BMI 30.60 kg/m   GEN: No acute distress; alert,appropriate. PULM: Breathing comfortably in no respiratory distress PSYCH: Normally interactive.    CERVICAL SPINE EXAM Range of motion: Flexion, extension, lateral bending, and rotation: Mild restriction of motion in all directions Spurling's: Negative Pain with terminal motion: Mild Spinous Processes: NT SCM: NT Upper paracervical  muscles: Mildly tender in the left Upper traps: Tender to palpation of the left C5-T1 intact, sensation and motor    Shoulder: R Inspection: No muscle wasting or winging Ecchymosis/edema: neg  AC joint, scapula, clavicle: Tender to palpation near the scapular border on the left Abduction: full, 5/5 Flexion: full, 5/5 IR, full, lift-off: 5/5 ER at neutral: full, 5/5 AC crossover and compression: neg Neer: neg Hawkins: neg Drop Test: neg Jobe: neg Supraspinatus insertion: NT Bicipital groove: NT Speed's: neg Yergason's: neg Sulcus sign: neg  Laboratory and Imaging Data:  Assessment and Plan:     ICD-10-CM   1. Trapezius strain, right, initial encounter  S46.811A     2. Acute pain of right shoulder  M25.511      He is most likely strained his trapezius versus rhomboid versus combination  I gave him some posterior shoulder work as well as some range of motion and basic strengthening for the scapular stabilizers  I am going to have him do some Celebrex and start with some home rehab  Medication Management during today's office visit: Meds ordered this encounter  Medications   celecoxib (CELEBREX) 200 MG capsule    Sig: Take 1 capsule (200 mg total) by mouth daily.    Dispense:  30 capsule    Refill:  2   Medications Discontinued During This Encounter  Medication Reason   OVER THE COUNTER MEDICATION Patient  Preference    Orders placed today for conditions managed today: No orders of the defined types were placed in this encounter.   Disposition: No follow-ups on file.  [employer] speech-to-text software was used for transcription in this dictation.  Possible transcriptional errors can occur using Animal nutritionist.   Signed,  Ranny Bye. Kaziah Krizek, MD   Outpatient Encounter Medications as of 12/16/2023  Medication Sig   amLODipine  (NORVASC ) 10 MG tablet TAKE ONE TABLET BY MOUTH ONCE A DAY   aspirin  EC 81 MG EC tablet Take 1 tablet (81 mg total) by  mouth daily.   atorvastatin  (LIPITOR) 80 MG tablet TAKE ONE TABLET BY MOUTH ONCE A DAY   celecoxib (CELEBREX) 200 MG capsule Take 1 capsule (200 mg total) by mouth daily.   cetirizine (ZYRTEC) 10 MG tablet Take 10 mg by mouth daily.     ibuprofen  (ADVIL ) 200 MG tablet Take 200 mg by mouth every 6 (six) hours as needed.   lisinopril  (ZESTRIL ) 20 MG tablet TAKE ONE TABLET BY MOUTH TWICE A DAY   metoprolol  tartrate (LOPRESSOR ) 25 MG tablet TAKE ONE HALF (1/2) A TABLET (12.5 MG) BY MOUTH TWO TIMES DAILY   [DISCONTINUED] OVER THE COUNTER MEDICATION Vitamin D 3 one capsule daily.   No facility-administered encounter medications on file as of 12/16/2023.

## 2023-12-15 NOTE — Telephone Encounter (Signed)
 Copied from CRM [id number]. Topic: Clinical - Red Word Triage >> Dec 15, 2023  3:23 PM Caliyah H wrote: Red Word that prompted transfer to Nurse Triage: Patient's wife called stating that the patient is experiencing pain in the right shoulder, specifically around the shoulder blade area. Symptoms began yesterday and are ongoing.   Chief Complaint: Shoulder injury Symptoms: Right shoulder pain Frequency: Constant  Pertinent Negatives: Patient denies any deformity to shoulder  Disposition: [] ED /[] Urgent Care (no appt availability in office) / [x] Appointment(In office/virtual)/ []  [facility]/ [] Home Care/ [] Refused Recommended Disposition /[] [facility]/ []  Follow-up with PCP Additional Notes: Patient's wife called to report that the patient was swinging his right arm at something yesterday and heard a pop in his shoulder. She states that he has been experiencing constant pain to his right shoulder with limited range of motion due to pain since that time. She states there is no obvious deformity of the shoulder. Appointment made tomorrow, 5/20, with Dr. Allison Arena per the patient's request.     Reason for Disposition  Can't move injured shoulder normally (e.g., full range of motion, able to touch top of head)  Answer Assessment - Initial Assessment Questions 1. MECHANISM: "How did the injury happen?"     Was swinging his right arm at something and heard a pop 2. ONSET: "When did the injury happen?" (Minutes or hours ago)      Yesterday  3. APPEARANCE of INJURY: "What does the injury look like?"      No deformity  4. SEVERITY: "Can you move the shoulder normally?"      No 5. SIZE: For cuts, bruises, or swelling, ask: "How large is it?" (e.g., inches or centimeters;  entire joint)      N/A 6. PAIN: "Is there pain?" If Yes, ask: "How bad is the pain?"   (e.g., Scale 1-10; or mild, moderate, severe)   - MILD (1-3): doesn't interfere with normal activities   - MODERATE  (4-7): interferes with normal activities (e.g., work or school) or awakens from sleep   - SEVERE (8-10): excruciating pain, unable to do any normal activities, unable to move arm at all due to pain     Moderate to severe  7. TETANUS: For any breaks in the skin, ask: "When was the last tetanus booster?"     N/A 8. OTHER SYMPTOMS: "Do you have any other symptoms?" (e.g., loss of sensation)     No  Protocols used: Shoulder Injury-A-AH

## 2023-12-15 NOTE — Telephone Encounter (Signed)
 Will check him out tomorrow.

## 2023-12-16 ENCOUNTER — Ambulatory Visit (INDEPENDENT_AMBULATORY_CARE_PROVIDER_SITE_OTHER): Admitting: Family Medicine

## 2023-12-16 ENCOUNTER — Encounter: Payer: Self-pay | Admitting: Family Medicine

## 2023-12-16 VITALS — BP 148/60 | HR 46 | Temp 97.5°F | Ht 70.0 in | Wt 213.2 lb

## 2023-12-16 DIAGNOSIS — S46811A Strain of other muscles, fascia and tendons at shoulder and upper arm level, right arm, initial encounter: Secondary | ICD-10-CM

## 2023-12-16 DIAGNOSIS — M25511 Pain in right shoulder: Secondary | ICD-10-CM

## 2023-12-16 MED ORDER — CELECOXIB 200 MG PO CAPS: 200.0000 mg | ORAL_CAPSULE | Freq: Every day | ORAL | 2 refills | Status: DC

## 2024-01-25 ENCOUNTER — Other Ambulatory Visit: Payer: Self-pay | Admitting: Internal Medicine

## 2024-02-04 ENCOUNTER — Ambulatory Visit (INDEPENDENT_AMBULATORY_CARE_PROVIDER_SITE_OTHER): Admitting: Internal Medicine

## 2024-02-04 ENCOUNTER — Ambulatory Visit: Payer: Self-pay

## 2024-02-04 VITALS — BP 112/70 | HR 60 | Temp 98.3°F | Ht 70.0 in | Wt 213.0 lb

## 2024-02-04 DIAGNOSIS — L97901 Non-pressure chronic ulcer of unspecified part of unspecified lower leg limited to breakdown of skin: Secondary | ICD-10-CM | POA: Diagnosis not present

## 2024-02-04 DIAGNOSIS — L97909 Non-pressure chronic ulcer of unspecified part of unspecified lower leg with unspecified severity: Secondary | ICD-10-CM | POA: Insufficient documentation

## 2024-02-04 NOTE — Telephone Encounter (Signed)
 Noted Sounds like hematoma---will check at the visit

## 2024-02-04 NOTE — Assessment & Plan Note (Signed)
 Small traumatic ulcers bilateral---healing okay Modest sized hematoma on left leg (4cm)---reassured, not infected, not a danger Discussed time course for resolution
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
## 2024-02-04 NOTE — Telephone Encounter (Signed)
 FYI Only or Action Required?: Action required by provider: request for appointment.  Patient was last seen in primary care on 12/16/2023 by Watt Mirza, MD.  Called Nurse Triage reporting Leg Injury.  Symptoms began several days ago.  Interventions attempted: OTC medications:  SABRA  Symptoms are: gradually worsening. Flipped lawnmower , left lower leg. 4=5 inch lump, tender.  Triage Disposition: See Physician Within 24 Hours  Patient/caregiver understands and will follow disposition?: Yes      Copied from CRM [id number]. Topic: Clinical - Red Word Triage >> Feb 04, 2024  8:23 AM Jayma L wrote: Red Word that prompted transfer to Nurse Triage: patient had a lawn mower filled on himself, there was some swelling and its hot to touch, wife is worried Reason for Disposition  [1] MODERATE pain (e.g., interferes with normal activities, limping) AND [2] high-risk adult (e.g., age > 60 years, osteoporosis, chronic steroid use)  Answer Assessment - Initial Assessment Questions 1. MECHANISM: How did the injury happen? (e.g., twisting injury, direct blow)      Flipped lawn mower 2. ONSET: When did the injury happen? (e.g., minutes, hours ago)      Monday 3. LOCATION: Where is the injury located?      Left shin 4. APPEARANCE of INJURY: What does the injury look like?  (e.g., deformity of leg)     Hand size lump, bruising 5. SEVERITY: Can you put weight on that leg? Can you walk?      yes 6. SIZE: For cuts, bruises, or swelling, ask: How large is it? (e.g., inches or centimeters)      4-5 inches 7. PAIN: Is there pain? If Yes, ask: How bad is the pain?   What does it keep you from doing? (Scale 0-10; or none, mild, moderate, severe)     Moderate 8. TETANUS: For any breaks in the skin, ask: When was your last tetanus booster?     unsure 9. OTHER SYMPTOMS: Do you have any other symptoms?      No 10. PREGNANCY: Is there any chance you are pregnant? When was your  last menstrual period?       N/a  Protocols used: Leg Injury-A-AH

## 2024-03-02 ENCOUNTER — Other Ambulatory Visit: Payer: Self-pay | Admitting: Family Medicine

## 2024-03-02 NOTE — Telephone Encounter (Signed)
 Last office visit 02/04/2024 for Skin Ulcer.  Last refilled 12/16/2023 for #30 with 2 refills by Dr. Watt.  Next appt: 04/11/24 for TOC with Dr. Bennett.

## 2024-04-07 ENCOUNTER — Encounter: Payer: No Typology Code available for payment source | Admitting: Internal Medicine

## 2024-04-11 ENCOUNTER — Ambulatory Visit

## 2024-04-11 VITALS — BP 120/74 | HR 56 | Temp 98.2°F | Ht 70.0 in | Wt 216.0 lb

## 2024-04-11 DIAGNOSIS — I2510 Atherosclerotic heart disease of native coronary artery without angina pectoris: Secondary | ICD-10-CM

## 2024-04-11 DIAGNOSIS — Z23 Encounter for immunization: Secondary | ICD-10-CM

## 2024-04-11 DIAGNOSIS — R0602 Shortness of breath: Secondary | ICD-10-CM

## 2024-04-11 DIAGNOSIS — I10 Essential (primary) hypertension: Secondary | ICD-10-CM

## 2024-04-11 MED ORDER — LISINOPRIL 20 MG PO TABS: 20.0000 mg | ORAL_TABLET | Freq: Every day | ORAL | Status: DC

## 2024-04-11 NOTE — Patient Instructions (Signed)
 Thank you for visiting [facility] today! Here's what we talked about: - Reduce Lisinopril  to 20mg  daily (1 tablet a day) - Check BP daily for 7d and send on MyChart

## 2024-04-11 NOTE — Progress Notes (Unsigned)
   Subjective:    Patient ID: Jason Gross, male    DOB: 12/28/1955, 68 y.o.   MRN: [id number]   Jason Gross is a very pleasant 68 y.o. male who presents today as a new patient.  No Chest pressure, gets exertional SOB at home, no chest pain, no lightheadedness, never smoked significantly.   Review of Systems      No Known Allergies  Current Outpatient Medications on File Prior to Visit  Medication Sig Dispense Refill   amLODipine  (NORVASC ) 10 MG tablet TAKE ONE TABLET BY MOUTH ONCE A DAY 90 tablet 0   aspirin  EC 81 MG EC tablet Take 1 tablet (81 mg total) by mouth daily. 30 tablet 0   atorvastatin  (LIPITOR) 80 MG tablet TAKE ONE TABLET BY MOUTH ONCE A DAY 90 tablet 0   cetirizine (ZYRTEC) 10 MG tablet Take 10 mg by mouth daily.       metoprolol  tartrate (LOPRESSOR ) 25 MG tablet TAKE ONE HALF (1/2) A TABLET (12.5 MG) BY MOUTH TWO TIMES DAILY 180 tablet 2   No current facility-administered medications on file prior to visit.    BP 120/74 (BP Location: Left Arm, Patient Position: Sitting, Cuff Size: Large)   Pulse (!) 56   Temp 98.2 F (36.8 C) (Oral)   Ht 5' 10 (1.778 m)   Wt 216 lb (98 kg)   SpO2 97%   BMI 30.99 kg/m   Objective:    Physical Exam Vitals and nursing note reviewed.  Constitutional:      Appearance: Normal appearance.  HENT:     Head: Normocephalic and atraumatic.  Eyes:     Extraocular Movements: Extraocular movements intact.     Conjunctiva/sclera: Conjunctivae normal.  Skin:    General: Skin is warm.  Neurological:     Mental Status: He is alert.  Psychiatric:        Mood and Affect: Mood normal.        Behavior: Behavior normal.          Assessment & Plan:   1. Atherosclerosis of native coronary artery of native heart without angina pectoris (Primary) 2. SOB (shortness of breath) on exertion New patient, past medical and social history thoroughly reviewed and updated in chart.  Per chart review, patient was last seen by cardiology  in 2023, where it was decided that his CAD would continue to be managed medically per the regimen below.  However, given patient's complaints of intermittent episodes of exertional SOB, concerned that this could be an anginal equivalent and possibly indicative of progression of his CAD, he may benefit from stress test versus heart cath, will refer to cardiology for more detailed evaluation and workup.  - Ambulatory referral to Cardiology  3. Essential hypertension, benign Normotensive this visit, has been taking lisinopril  twice daily, given that this is a once daily medication, instructed patient to take as below for once daily dosing.  Counseled him to check home BP readings and send to me, regimen can be augmented based on this.  - lisinopril  (ZESTRIL ) 20 MG tablet; Take 2 tablet (40 mg total) by mouth daily.  4. Need for influenza vaccination - Flu vaccine HIGH DOSE PF(Fluzone Trivalent)   Return in about 18 days (around 04/29/2024) for CPE.   Jason Gallier K Girl Schissler, MD  04/11/24

## 2024-04-13 MED ORDER — LISINOPRIL 20 MG PO TABS: 40.0000 mg | ORAL_TABLET | Freq: Every day | ORAL | Status: DC

## 2024-04-21 ENCOUNTER — Other Ambulatory Visit: Payer: Self-pay

## 2024-04-21 DIAGNOSIS — I10 Essential (primary) hypertension: Secondary | ICD-10-CM

## 2024-04-22 ENCOUNTER — Other Ambulatory Visit: Payer: Self-pay

## 2024-04-22 MED ORDER — AMLODIPINE BESYLATE 10 MG PO TABS: 10.0000 mg | ORAL_TABLET | Freq: Every day | ORAL | 1 refills | Status: DC

## 2024-04-22 MED ORDER — LISINOPRIL 40 MG PO TABS: 40.0000 mg | ORAL_TABLET | Freq: Every day | ORAL | 3 refills | Status: DC

## 2024-04-23 MED ORDER — ATORVASTATIN CALCIUM 80 MG PO TABS: 80.0000 mg | ORAL_TABLET | Freq: Every day | ORAL | 1 refills | Status: DC

## 2024-04-29 ENCOUNTER — Ambulatory Visit

## 2024-05-03 ENCOUNTER — Ambulatory Visit (INDEPENDENT_AMBULATORY_CARE_PROVIDER_SITE_OTHER)

## 2024-05-03 VITALS — BP 130/74 | HR 59 | Temp 97.9°F | Ht 70.0 in | Wt 215.0 lb

## 2024-05-03 DIAGNOSIS — Z125 Encounter for screening for malignant neoplasm of prostate: Secondary | ICD-10-CM

## 2024-05-03 DIAGNOSIS — Z0000 Encounter for general adult medical examination without abnormal findings: Secondary | ICD-10-CM | POA: Diagnosis not present

## 2024-05-03 DIAGNOSIS — Z131 Encounter for screening for diabetes mellitus: Secondary | ICD-10-CM

## 2024-05-03 DIAGNOSIS — I10 Essential (primary) hypertension: Secondary | ICD-10-CM | POA: Diagnosis not present

## 2024-05-03 DIAGNOSIS — E785 Hyperlipidemia, unspecified: Secondary | ICD-10-CM | POA: Diagnosis not present

## 2024-05-03 DIAGNOSIS — Z113 Encounter for screening for infections with a predominantly sexual mode of transmission: Secondary | ICD-10-CM | POA: Diagnosis not present

## 2024-05-03 DIAGNOSIS — Z87891 Personal history of nicotine dependence: Secondary | ICD-10-CM | POA: Diagnosis not present

## 2024-05-03 LAB — LIPID PANEL
Cholesterol: 105 mg/dL (ref 0–200)
HDL: 34.8 mg/dL — ABNORMAL LOW (ref >39.00)
LDL Cholesterol: 50 mg/dL (ref 0–99)
NonHDL: 69.85
Total CHOL/HDL Ratio: 3
Triglycerides: 99 mg/dL (ref 0.0–149.0)
VLDL: 19.8 mg/dL (ref 0.0–40.0)

## 2024-05-03 LAB — PSA: PSA: 1.22 ng/mL (ref 0.10–4.00)

## 2024-05-03 LAB — HEMOGLOBIN A1C: Hgb A1c MFr Bld: 6.2 % (ref 4.6–6.5)

## 2024-05-03 NOTE — Progress Notes (Signed)
 Assessment & Plan:   1. Physical exam, annual (Primary) 2. Former smoker 3. Prostate cancer screening 4. Hyperlipidemia, unspecified hyperlipidemia type 5. Diabetes mellitus screening 6. Screen for STD (sexually transmitted disease) Age-appropriate screening, counseling and vaccines discussed with patient. Healthy diet and exercise recommendations given.  Given former smoker, AAA screening ordered. Colonoscopy due in 2029 for 56yr repeat based on polyp pathology. PSA, lipid panel, hep C and a1c ordered as age appropriate screenings. UTD on vaccinations, he will consider COVID from pharmacy.  - US  AORTA DUPLEX COMPLETE; Future - PSA - Lipid Panel - Hemoglobin A1c - Hepatitis C antibody  7. Essential hypertension, benign Normotensive this visit, clarified that pt should be on 40mg  on Lisinopril , not 20mg  as he was previously taking. Will augment based on home readings.  - Continue Lisinopril  40mg  daily - Continue Amlodipine  10mg  daily   Follow-up: Return in about 1 year (around 05/03/2025) for CPE.        Subjective:   Patient ID: Jason Gross, male    DOB: Dec 06, 1955  Age: 68 y.o. MRN: [id number]  Patient Care Team: Bennett Reuben POUR, MD as PCP - General (Family Medicine)   CC:  Chief Complaint  Patient presents with   Medicare Wellness    Here with wife. Question about medication regimen.     Jason Gross is a 68 y.o. male who presents today for a complete physical exam.  Lisinopril : was taking 20mg  not the 40mg   Diet:Ice cream most days before bed Exercise: Not exercising  Mood: yes Sleep: Yes Dental: UTD Vision: UTD  Advanced Directives Patient does have advanced directives including healthcare power of attorney. He does not have a copy in the electronic medical record.    Depression Screening;    05/03/2024   10:57 AM 04/11/2024    2:28 PM 02/04/2024    2:11 PM 04/07/2023    8:50 AM 04/07/2023    8:21 AM 04/03/2022    4:08 PM 03/25/2021    3:38 PM   PHQ 2/9 Scores  PHQ - 2 Score 0 0 0 0 0 0 0     Anxiety Screening:     No data to display           ROS: Negative unless specifically indicated above in HPI.       Objective:     BP 130/74 (BP Location: Left Arm, Patient Position: Sitting, Cuff Size: Large)   Pulse (!) 59   Temp 97.9 F (36.6 C) (Oral)   Ht 5' 10 (1.778 m)   Wt 215 lb (97.5 kg)   SpO2 97%   BMI 30.85 kg/m    Physical Exam Vitals and nursing note reviewed.  Constitutional:      Appearance: He is obese.  HENT:     Head: Normocephalic and atraumatic.     Right Ear: Tympanic membrane, ear canal and external ear normal.     Left Ear: Tympanic membrane, ear canal and external ear normal.     Nose: No congestion or rhinorrhea.     Mouth/Throat:     Mouth: Mucous membranes are moist.     Pharynx: No oropharyngeal exudate or posterior oropharyngeal erythema.  Eyes:     Extraocular Movements: Extraocular movements intact.     Conjunctiva/sclera: Conjunctivae normal.     Pupils: Pupils are equal, round, and reactive to light.  Cardiovascular:     Rate and Rhythm: Normal rate and regular rhythm.     Heart sounds: No murmur heard.  Pulmonary:     Effort: Pulmonary effort is normal.     Breath sounds: Normal breath sounds.  Abdominal:     General: Abdomen is flat. Bowel sounds are normal.     Palpations: Abdomen is soft.  Musculoskeletal:     Right lower leg: No edema.     Left lower leg: No edema.  Skin:    General: Skin is warm.  Neurological:     Mental Status: He is alert. Mental status is at baseline.  Psychiatric:        Mood and Affect: Mood normal.        Behavior: Behavior normal.         Reuben MARLA Burkes, MD  05/03/24

## 2024-05-03 NOTE — Patient Instructions (Addendum)
 Thank you for visiting [facility] today! Here's what we talked about: - Send BP readings after 7d via MyChart - Walk 3 times a week for at least 30 mins - Covid vaccine from pharmacy - Get us  power of attorney documents - Make follow up visit for 05/03/2025 for next physical

## 2024-05-04 ENCOUNTER — Ambulatory Visit: Payer: Self-pay

## 2024-05-04 LAB — HEPATITIS C ANTIBODY: Hepatitis C Ab: NONREACTIVE

## 2024-05-06 ENCOUNTER — Ambulatory Visit
Admission: RE | Admit: 2024-05-06 | Discharge: 2024-05-06 | Disposition: A | Discharge status: Home / Self Care | Source: Ambulatory Visit

## 2024-05-06 DIAGNOSIS — Z87891 Personal history of nicotine dependence: Secondary | ICD-10-CM

## 2024-05-09 NOTE — Progress Notes (Unsigned)
 Cardiology Office Note  Date:  05/10/2024   ID:  Jason Gross, DOB 05-Jun-1956, MRN [id number]  PCP:  Bennett Reuben POUR, MD   Chief Complaint  Patient presents with   New Patient (Initial Visit)    Referred by Dr. Bennett to establish care for CAD & to follow up Aorta ultrasound 05/06/2024. Patient was being followed by Dr. Wolm Rhyme with his last visit being in 2023. Patient c/o visual changes since the increase in Lisinopril  and has shortness of breath with walking up 12 steps in his home and any amount of exertion.     HPI:  Jason Gross a 68 y.o. malewith past medical history of: Former smoker Coronary artery disease three-vessel, medical management recommended in 2018 Cardiac arrest 2018 Syncope July 14, 2017, orthostasis Hypertension Who presents by referral from [facility] for coronary artery disease  Retired from [employer] 2023 Prior cardiac arrest 2018 with details as below June 30, 2017 brought in by ambulance after being found unresponsive, underwent CPR, Intubated Cardiac catheterization December 2018 Three-vessel coronary artery disease, including moderate diffuse mid and distal LAD disease, 95% D1 stenosis, and chronic total occlusions of the proximal/mid LCx and proximal/mid RCA. Suspect culprit vessel may have been D1, as other occlusions are well-collateralized and likely chronic. Upper normal to mildly elevated left ventricular filling pressure. Medical management recommended  Records requested and reviewed Lexiscan myoview  Stress test: 3/22  Echo 3/22 NORMAL LEFT VENTRICULAR SYSTOLIC FUNCTION  NORMAL RIGHT VENTRICULAR SYSTOLIC FUNCTION  MODERATE VALVULAR REGURGITATION (See above)  NO VALVULAR STENOSIS   Reports chronic mild shortness of breath on exertion Does not feel there is a change to the Chest tightness concerning for angina  Reports his lisinopril  was recently increased from 20 up to 40 On higher dose lisinopril  has vision changes,  prefers to take lisinopril  20 twice daily which she was able to tolerate Takes metoprolol  tartrate 12.5 twice daily, amlodipine  in the afternoon  EKG personally reviewed by myself on todays visit EKG Interpretation Date/Time:  Tuesday May 10 2024 09:04:19 EDT Ventricular Rate:  51 PR Interval:  182 QRS Duration:  108 QT Interval:  454 QTC Calculation: 418 R Axis:   -39  Text Interpretation: Sinus bradycardia Left axis deviation When compared with ECG of 04-Jul-2017 05:22, Vent. rate has decreased BY  37 BPM ST no longer depressed in Lateral leads QT has shortened Confirmed by Sherby Moncayo [phone]) on 05/10/2024 9:32:07 AM     PMH:   has a past medical history of Allergy, Benign mass of parotid gland, Cataract, Coronary artery disease, GERD (gastroesophageal reflux disease), Hyperlipidemia, Hypertension, Myocardial infarction (HCC), and Vertigo.  PSH:    Past Surgical History:  Procedure Laterality Date   LEFT HEART CATH AND CORONARY ANGIOGRAPHY N/A 06/30/2017   Procedure: LEFT HEART CATH AND CORONARY ANGIOGRAPHY;  Surgeon: Mady Bruckner, MD;  Location: [facility] INVASIVE CV LAB;  Service: Cardiovascular;  Laterality: N/A;   PAROTIDECTOMY Left 01/07/2017   Procedure: PAROTIDECTOMY (SUPERFICIAL);  Surgeon: Blair Mt, MD;  Location: [facility] ORS;  Service: ENT;  Laterality: Left;    Current Outpatient Medications  Medication Sig Dispense Refill   amLODipine  (NORVASC ) 10 MG tablet Take 1 tablet (10 mg total) by mouth daily. 90 tablet 1   aspirin  EC 81 MG EC tablet Take 1 tablet (81 mg total) by mouth daily. 30 tablet 0   atorvastatin  (LIPITOR) 80 MG tablet Take 1 tablet (80 mg total) by mouth daily. 90 tablet 1   cetirizine (ZYRTEC) 10  MG tablet Take 10 mg by mouth daily.       lisinopril  (ZESTRIL ) 40 MG tablet Take 1 tablet (40 mg total) by mouth daily. 90 tablet 3   metoprolol  tartrate (LOPRESSOR ) 25 MG tablet TAKE ONE HALF (1/2) A TABLET (12.5 MG) BY MOUTH TWO TIMES DAILY 180  tablet 2   No current facility-administered medications for this visit.    Allergies:   Patient has no known allergies.   Social History:  The patient  reports that he quit smoking about 55 years ago. His smoking use included cigarettes. He has been exposed to tobacco smoke. He has never used smokeless tobacco. He reports that he does not drink alcohol and does not use drugs.   Family History:   family history includes Coronary artery disease in his father and maternal grandfather; Dementia in his mother; Diabetes in his maternal grandfather, maternal uncle, and sister; Esophageal cancer in his paternal uncle; Healthy in his mother.    Review of Systems: Review of Systems  Constitutional: Negative.   HENT: Negative.    Respiratory:  Positive for shortness of breath.   Cardiovascular: Negative.   Gastrointestinal: Negative.   Musculoskeletal: Negative.   Neurological: Negative.   Psychiatric/Behavioral: Negative.    All other systems reviewed and are negative.   PHYSICAL EXAM: VS:  BP 120/60 (BP Location: Right Arm, Patient Position: Sitting, Cuff Size: Normal)   Pulse (!) 51   Ht 5' 10 (1.778 m)   Wt 216 lb 6 oz (98.1 kg)   SpO2 97%   BMI 31.05 kg/m  , BMI Body mass index is 31.05 kg/m. GEN: Well nourished, well developed, in no acute distress HEENT: normal Neck: no JVD, carotid bruits, or masses Cardiac: RRR; no murmurs, rubs, or gallops,no edema  Respiratory:  clear to auscultation bilaterally, normal work of breathing GI: soft, nontender, nondistended, + BS MS: no deformity or atrophy Skin: warm and dry, no rash Neuro:  Strength and sensation are intact Psych: euthymic mood, full affect   Recent Labs: No results found for requested labs within last 365 days.    Lipid Panel Lab Results  Component Value Date   CHOL 105 05/03/2024   HDL 34.80 (L) 05/03/2024   LDLCALC 50 05/03/2024   TRIG 99.0 05/03/2024    Wt Readings from Last 3 Encounters:  05/10/24 216  lb 6 oz (98.1 kg)  05/03/24 215 lb (97.5 kg)  04/11/24 216 lb (98 kg)      ASSESSMENT AND PLAN:  Problem List Items Addressed This Visit       Cardiology Problems   Essential hypertension, benign   Relevant Orders   EKG 12-Lead (Completed)   Coronary atherosclerosis of native coronary artery - Primary   Relevant Orders   EKG 12-Lead (Completed)   Hyperlipidemia    Coronary artery disease with stable angina Per cardiac catheterization 2018 at the time of cardiac arrest Details discussed Denies anginal symptoms Non-smoker, cholesterol at goal Recommended weight loss, dietary discretion given A1c trending upwards We will hold off on additional testing at this time Recommend he call us  for any worsening shortness of breath or chest pain on exertion  Essential hypertension Reports he did not tolerate lisinopril  40 at 1 time Recommend he take lisinopril  20 twice daily, change metoprolol  to tartrate to metoprolol  succinate 20 5 in the morning Continue amlodipine  10 Check orthostatic numbers at home For drops in pressure may need to cut back on medication  Hyperlipidemia Cholesterol is at goal on the  current lipid regimen. No changes to the medications were made.      Signed, Velinda Lunger, M.D., Ph.D. [facility] [place], [place] [phone]

## 2024-05-10 ENCOUNTER — Encounter: Payer: Self-pay | Admitting: Cardiovascular Disease

## 2024-05-10 ENCOUNTER — Ambulatory Visit: Attending: Cardiovascular Disease | Admitting: Cardiovascular Disease

## 2024-05-10 VITALS — BP 120/60 | HR 51 | Ht 70.0 in | Wt 216.4 lb

## 2024-05-10 DIAGNOSIS — E782 Mixed hyperlipidemia: Secondary | ICD-10-CM

## 2024-05-10 DIAGNOSIS — I10 Essential (primary) hypertension: Secondary | ICD-10-CM | POA: Diagnosis not present

## 2024-05-10 DIAGNOSIS — I25118 Atherosclerotic heart disease of native coronary artery with other forms of angina pectoris: Secondary | ICD-10-CM | POA: Diagnosis not present

## 2024-05-10 MED ORDER — LISINOPRIL 20 MG PO TABS: 20.0000 mg | ORAL_TABLET | Freq: Two times a day (BID) | ORAL | 3 refills | Status: DC

## 2024-05-10 MED ORDER — AMLODIPINE BESYLATE 10 MG PO TABS: 10.0000 mg | ORAL_TABLET | Freq: Every day | ORAL | 3 refills | Status: DC

## 2024-05-10 MED ORDER — METOPROLOL SUCCINATE ER 25 MG PO TB24: 25.0000 mg | ORAL_TABLET | Freq: Every day | ORAL | 3 refills | Status: AC

## 2024-05-10 MED ORDER — ATORVASTATIN CALCIUM 80 MG PO TABS: 80.0000 mg | ORAL_TABLET | Freq: Every day | ORAL | 3 refills | Status: AC

## 2024-05-10 NOTE — Patient Instructions (Addendum)
 Medication Instructions:   Hold metoprolol  tartrate Start metoprolol  succinate 25 mg daily  Lisinopril  20 twice a day (not 40 once a day)  For dizzy, call the office  If you need a refill on your cardiac medications before your next appointment, please call your pharmacy.   Lab work: No new labs needed  Testing/Procedures: No new testing needed  Follow-Up: At [facility], you and your health needs are our priority.  As part of our continuing mission to provide you with exceptional heart care, we have created designated Provider Care Teams.  These Care Teams include your primary Cardiologist (physician) and Advanced Practice Providers (APPs -  Physician Assistants and Nurse Practitioners) who all work together to provide you with the care you need, when you need it.  You will need a follow up appointment in 12 months  Providers on your designated Care Team:   Lonni Meager, NP Bernardino Bring, PA-C Cadence [place], [place]  COVID-19 Vaccine Information can be found at: [email or web address] For questions related to vaccine distribution or appointments, please email [email or web address] or call [phone].

## 2024-06-06 ENCOUNTER — Encounter: Payer: Self-pay | Admitting: Cardiovascular Disease

## 2024-06-13 ENCOUNTER — Telehealth: Payer: Self-pay | Admitting: Cardiovascular Disease

## 2024-06-13 MED ORDER — AMLODIPINE BESYLATE 5 MG PO TABS: 5.0000 mg | ORAL_TABLET | Freq: Two times a day (BID) | ORAL | 6 refills | Status: AC

## 2024-06-13 MED ORDER — LISINOPRIL 10 MG PO TABS: 20.0000 mg | ORAL_TABLET | Freq: Two times a day (BID) | ORAL | 6 refills | Status: DC

## 2024-06-13 MED ORDER — LISINOPRIL 10 MG PO TABS: 10.0000 mg | ORAL_TABLET | Freq: Two times a day (BID) | ORAL | 6 refills | Status: AC

## 2024-06-13 NOTE — Telephone Encounter (Signed)
 Called and spoke with Rozelle from pharmacy. Clarified order. Correct order sent to [facility] pharmacy.

## 2024-06-13 NOTE — Telephone Encounter (Signed)
 Pt c/o medication issue:  1. Name of Medication:  lisinopril  (ZESTRIL ) 10 MG tablet    2. How are you currently taking this medication (dosage and times per day)?  Take 2 tablets (20 mg total) by mouth in the morning and at bedtime.      3. Are you having a reaction (difficulty breathing--STAT)? No  4. What is your medication issue? Landon from pharmacy is requesting a callback regarding them needing clarity on the way this medication should be taken. Pt is tell him he was told in office he should only take 1 tablet. Please clarify.

## 2025-04-27 ENCOUNTER — Other Ambulatory Visit

## 2025-05-04 ENCOUNTER — Encounter
